# Patient Record
Sex: Male | Born: 1935
Health system: Southern US, Community
[De-identification: ages and names within clinical notes are randomized; demographics above are authoritative.]

## PROBLEM LIST (undated history)

## (undated) DIAGNOSIS — I10 Essential (primary) hypertension: Secondary | ICD-10-CM

## (undated) DIAGNOSIS — I2510 Atherosclerotic heart disease of native coronary artery without angina pectoris: Secondary | ICD-10-CM

## (undated) DIAGNOSIS — Z9889 Other specified postprocedural states: Secondary | ICD-10-CM

## (undated) DIAGNOSIS — E119 Type 2 diabetes mellitus without complications: Secondary | ICD-10-CM

## (undated) DIAGNOSIS — M539 Dorsopathy, unspecified: Secondary | ICD-10-CM

## (undated) DIAGNOSIS — H6690 Otitis media, unspecified, unspecified ear: Secondary | ICD-10-CM

## (undated) DIAGNOSIS — R7309 Other abnormal glucose: Secondary | ICD-10-CM

## (undated) DIAGNOSIS — C61 Malignant neoplasm of prostate: Secondary | ICD-10-CM

## (undated) DIAGNOSIS — E785 Hyperlipidemia, unspecified: Secondary | ICD-10-CM

## (undated) DIAGNOSIS — N529 Male erectile dysfunction, unspecified: Secondary | ICD-10-CM

## (undated) DIAGNOSIS — M489 Spondylopathy, unspecified: Secondary | ICD-10-CM

## (undated) HISTORY — DX: Dorsopathy, unspecified: M53.9

## (undated) HISTORY — DX: Otitis media, unspecified, unspecified ear: H66.90

## (undated) HISTORY — DX: Spondylopathy, unspecified: M48.9

## (undated) HISTORY — DX: Other specified postprocedural states: Z98.890

## (undated) HISTORY — DX: Type 2 diabetes mellitus without complications: E11.9

## (undated) HISTORY — DX: Malignant neoplasm of prostate: C61

## (undated) HISTORY — DX: Male erectile dysfunction, unspecified: N52.9

## (undated) HISTORY — PX: PROSTATECTOMY: SHX69

## (undated) HISTORY — PX: LAMINECTOMY: SHX219

## (undated) HISTORY — DX: Other abnormal glucose: R73.09

## (undated) HISTORY — DX: Hyperlipidemia, unspecified: E78.5

## (undated) HISTORY — PX: OTHER SURGICAL HISTORY: SHX169

## (undated) HISTORY — DX: Essential (primary) hypertension: I10

## (undated) HISTORY — DX: Atherosclerotic heart disease of native coronary artery without angina pectoris: I25.10

---

## 1990-01-27 HISTORY — PX: ANGIOPLASTY: SHX39

## 1996-09-27 ENCOUNTER — Encounter: Payer: Self-pay | Admitting: Family Medicine

## 1996-09-27 LAB — CONVERTED CEMR LAB: PSA: 2 ng/mL

## 1998-04-28 ENCOUNTER — Encounter: Payer: Self-pay | Admitting: Family Medicine

## 1998-04-28 LAB — CONVERTED CEMR LAB: PSA: 3.6 ng/mL

## 1999-08-28 ENCOUNTER — Encounter: Payer: Self-pay | Admitting: Family Medicine

## 1999-08-28 LAB — CONVERTED CEMR LAB: PSA: 2.8 ng/mL

## 2000-07-27 ENCOUNTER — Encounter: Payer: Self-pay | Admitting: Family Medicine

## 2000-07-27 LAB — CONVERTED CEMR LAB: PSA: 3.6 ng/mL

## 2001-01-27 ENCOUNTER — Encounter: Payer: Self-pay | Admitting: Family Medicine

## 2001-01-27 LAB — CONVERTED CEMR LAB: Hgb A1c MFr Bld: 5.4 %

## 2001-06-27 ENCOUNTER — Encounter: Payer: Self-pay | Admitting: Family Medicine

## 2001-06-27 LAB — CONVERTED CEMR LAB
Hgb A1c MFr Bld: 5.3 %
PSA: 3.1 ng/mL

## 2002-06-28 ENCOUNTER — Encounter: Payer: Self-pay | Admitting: Family Medicine

## 2002-06-28 LAB — CONVERTED CEMR LAB: PSA: 4 ng/mL

## 2002-10-28 ENCOUNTER — Encounter: Payer: Self-pay | Admitting: Family Medicine

## 2002-10-28 LAB — CONVERTED CEMR LAB: PSA: 4.1 ng/mL

## 2003-01-09 ENCOUNTER — Inpatient Hospital Stay (HOSPITAL_COMMUNITY): Admission: RE | Admit: 2003-01-09 | Discharge: 2003-01-12 | Payer: Self-pay | Admitting: Urology

## 2003-01-09 ENCOUNTER — Encounter (INDEPENDENT_AMBULATORY_CARE_PROVIDER_SITE_OTHER): Payer: Self-pay | Admitting: Specialist

## 2003-03-28 ENCOUNTER — Encounter: Payer: Self-pay | Admitting: Family Medicine

## 2003-03-28 LAB — CONVERTED CEMR LAB: PSA: 0 ng/mL

## 2003-06-01 ENCOUNTER — Inpatient Hospital Stay (HOSPITAL_COMMUNITY): Admission: EM | Admit: 2003-06-01 | Discharge: 2003-06-07 | Payer: Self-pay | Admitting: Emergency Medicine

## 2003-06-02 HISTORY — PX: CORONARY ANGIOPLASTY: SHX604

## 2003-07-28 ENCOUNTER — Encounter: Payer: Self-pay | Admitting: Family Medicine

## 2003-07-28 LAB — CONVERTED CEMR LAB
Hgb A1c MFr Bld: 5.3 %
Microalbumin U total vol: 1.3 mg/L

## 2003-12-28 ENCOUNTER — Ambulatory Visit: Payer: Self-pay

## 2004-01-05 ENCOUNTER — Ambulatory Visit: Payer: Self-pay | Admitting: Cardiology

## 2004-01-15 ENCOUNTER — Ambulatory Visit: Payer: Self-pay | Admitting: Cardiology

## 2004-02-12 ENCOUNTER — Ambulatory Visit: Payer: Self-pay | Admitting: Family Medicine

## 2004-04-29 ENCOUNTER — Ambulatory Visit: Payer: Self-pay | Admitting: Cardiology

## 2004-06-26 ENCOUNTER — Ambulatory Visit: Payer: Self-pay | Admitting: Internal Medicine

## 2004-08-20 ENCOUNTER — Ambulatory Visit: Payer: Self-pay | Admitting: Cardiology

## 2005-01-28 ENCOUNTER — Ambulatory Visit: Payer: Self-pay | Admitting: Family Medicine

## 2005-02-14 ENCOUNTER — Ambulatory Visit: Payer: Self-pay | Admitting: Family Medicine

## 2005-06-02 ENCOUNTER — Emergency Department (HOSPITAL_COMMUNITY): Admission: EM | Admit: 2005-06-02 | Discharge: 2005-06-02 | Payer: Self-pay | Admitting: Family Medicine

## 2005-09-25 ENCOUNTER — Ambulatory Visit: Payer: Self-pay | Admitting: Cardiology

## 2005-11-13 ENCOUNTER — Ambulatory Visit: Payer: Self-pay | Admitting: Family Medicine

## 2005-11-18 ENCOUNTER — Ambulatory Visit: Payer: Self-pay | Admitting: Family Medicine

## 2005-11-26 ENCOUNTER — Ambulatory Visit: Payer: Self-pay | Admitting: Gastroenterology

## 2005-12-23 ENCOUNTER — Ambulatory Visit: Payer: Self-pay | Admitting: Family Medicine

## 2005-12-30 ENCOUNTER — Encounter: Admission: RE | Admit: 2005-12-30 | Discharge: 2005-12-30 | Payer: Self-pay | Admitting: Orthopedic Surgery

## 2006-01-22 ENCOUNTER — Encounter: Admission: RE | Admit: 2006-01-22 | Discharge: 2006-01-22 | Payer: Self-pay | Admitting: Orthopedic Surgery

## 2006-01-28 ENCOUNTER — Encounter (INDEPENDENT_AMBULATORY_CARE_PROVIDER_SITE_OTHER): Payer: Self-pay | Admitting: Specialist

## 2006-01-28 ENCOUNTER — Ambulatory Visit: Payer: Self-pay | Admitting: Gastroenterology

## 2006-01-28 LAB — HM COLONOSCOPY

## 2006-05-20 ENCOUNTER — Ambulatory Visit: Payer: Self-pay | Admitting: Family Medicine

## 2006-10-02 ENCOUNTER — Ambulatory Visit: Payer: Self-pay | Admitting: Cardiology

## 2006-11-12 ENCOUNTER — Ambulatory Visit: Payer: Self-pay | Admitting: Family Medicine

## 2006-11-12 LAB — CONVERTED CEMR LAB
ALT: 35 units/L (ref 0–53)
AST: 25 units/L (ref 0–37)
BUN: 11 mg/dL (ref 6–23)
CO2: 29 meq/L (ref 19–32)
Calcium: 9.3 mg/dL (ref 8.4–10.5)
Chloride: 107 meq/L (ref 96–112)
Cholesterol: 193 mg/dL (ref 0–200)
Creatinine, Ser: 0.8 mg/dL (ref 0.4–1.5)
GFR calc Af Amer: 123 mL/min
GFR calc non Af Amer: 101 mL/min
Glucose, Bld: 117 mg/dL — ABNORMAL HIGH (ref 70–99)
HDL: 46.5 mg/dL (ref >39.0)
LDL Cholesterol: 109 mg/dL — ABNORMAL HIGH (ref 0–99)
PSA: <0.03 ng/mL — ABNORMAL LOW (ref 0.10–4.00)
Potassium: 4.6 meq/L (ref 3.5–5.1)
Sodium: 140 meq/L (ref 135–145)
TSH: 2.59 microintl units/mL (ref 0.35–5.50)
Total CHOL/HDL Ratio: 4.2
Triglycerides: 188 mg/dL — ABNORMAL HIGH (ref 0–149)
VLDL: 38 mg/dL (ref 0–40)

## 2006-11-20 ENCOUNTER — Encounter: Admission: RE | Admit: 2006-11-20 | Discharge: 2006-11-20 | Payer: Self-pay | Admitting: Orthopedic Surgery

## 2006-11-26 ENCOUNTER — Ambulatory Visit: Payer: Self-pay | Admitting: Family Medicine

## 2006-11-26 DIAGNOSIS — N529 Male erectile dysfunction, unspecified: Secondary | ICD-10-CM | POA: Insufficient documentation

## 2006-11-26 DIAGNOSIS — E782 Mixed hyperlipidemia: Secondary | ICD-10-CM

## 2006-11-26 DIAGNOSIS — I10 Essential (primary) hypertension: Secondary | ICD-10-CM

## 2006-11-26 DIAGNOSIS — C61 Malignant neoplasm of prostate: Secondary | ICD-10-CM

## 2006-11-26 DIAGNOSIS — E749 Disorder of carbohydrate metabolism, unspecified: Secondary | ICD-10-CM | POA: Insufficient documentation

## 2006-11-26 DIAGNOSIS — M7530 Calcific tendinitis of unspecified shoulder: Secondary | ICD-10-CM | POA: Insufficient documentation

## 2006-12-04 ENCOUNTER — Encounter: Admission: RE | Admit: 2006-12-04 | Discharge: 2006-12-04 | Payer: Self-pay | Admitting: Orthopedic Surgery

## 2006-12-17 ENCOUNTER — Encounter: Admission: RE | Admit: 2006-12-17 | Discharge: 2006-12-17 | Payer: Self-pay | Admitting: Orthopedic Surgery

## 2007-01-05 ENCOUNTER — Ambulatory Visit: Payer: Self-pay | Admitting: Family Medicine

## 2007-01-27 ENCOUNTER — Ambulatory Visit: Payer: Self-pay | Admitting: Family Medicine

## 2007-02-10 ENCOUNTER — Encounter: Payer: Self-pay | Admitting: Family Medicine

## 2007-03-16 ENCOUNTER — Encounter: Payer: Self-pay | Admitting: Family Medicine

## 2007-03-17 ENCOUNTER — Encounter: Admission: RE | Admit: 2007-03-17 | Discharge: 2007-03-17 | Payer: Self-pay | Admitting: Orthopedic Surgery

## 2007-03-18 ENCOUNTER — Encounter: Payer: Self-pay | Admitting: Family Medicine

## 2007-03-18 DIAGNOSIS — M5137 Other intervertebral disc degeneration, lumbosacral region: Secondary | ICD-10-CM

## 2007-03-18 DIAGNOSIS — M51379 Other intervertebral disc degeneration, lumbosacral region without mention of lumbar back pain or lower extremity pain: Secondary | ICD-10-CM | POA: Insufficient documentation

## 2007-03-24 ENCOUNTER — Ambulatory Visit: Payer: Self-pay | Admitting: Internal Medicine

## 2007-03-31 ENCOUNTER — Ambulatory Visit: Payer: Self-pay

## 2007-03-31 ENCOUNTER — Encounter: Payer: Self-pay | Admitting: Family Medicine

## 2007-04-09 ENCOUNTER — Ambulatory Visit: Payer: Self-pay | Admitting: Internal Medicine

## 2007-04-13 ENCOUNTER — Inpatient Hospital Stay (HOSPITAL_COMMUNITY): Admission: RE | Admit: 2007-04-13 | Discharge: 2007-04-15 | Payer: Self-pay | Admitting: Neurosurgery

## 2007-05-20 ENCOUNTER — Ambulatory Visit: Payer: Self-pay | Admitting: Family Medicine

## 2007-05-20 LAB — CONVERTED CEMR LAB: Glucose, Bld: 124 mg/dL — ABNORMAL HIGH (ref 70–99)

## 2007-05-25 ENCOUNTER — Ambulatory Visit: Payer: Self-pay | Admitting: Family Medicine

## 2007-09-23 ENCOUNTER — Ambulatory Visit: Payer: Self-pay | Admitting: Cardiology

## 2007-10-27 ENCOUNTER — Encounter: Payer: Self-pay | Admitting: Family Medicine

## 2007-10-28 ENCOUNTER — Ambulatory Visit: Payer: Self-pay | Admitting: Family Medicine

## 2007-10-28 LAB — CONVERTED CEMR LAB
ALT: 31 units/L (ref 0–53)
AST: 25 units/L (ref 0–37)
Albumin: 4.1 g/dL (ref 3.5–5.2)
Alkaline Phosphatase: 66 units/L (ref 39–117)
BUN: 18 mg/dL (ref 6–23)
Basophils Absolute: 0 10*3/uL (ref 0.0–0.1)
Basophils Relative: 0.2 % (ref 0.0–3.0)
Bilirubin, Direct: 0.1 mg/dL (ref 0.0–0.3)
CO2: 29 meq/L (ref 19–32)
Calcium: 9.4 mg/dL (ref 8.4–10.5)
Chloride: 104 meq/L (ref 96–112)
Cholesterol: 152 mg/dL (ref 0–200)
Creatinine, Ser: 0.9 mg/dL (ref 0.4–1.5)
Creatinine,U: 93.4 mg/dL
Eosinophils Absolute: 0.2 10*3/uL (ref 0.0–0.7)
Eosinophils Relative: 3.5 % (ref 0.0–5.0)
GFR calc Af Amer: 107 mL/min
GFR calc non Af Amer: 88 mL/min
Glucose, Bld: 103 mg/dL — ABNORMAL HIGH (ref 70–99)
HCT: 39.5 % (ref 39.0–52.0)
HDL: 36.7 mg/dL — ABNORMAL LOW (ref >39.0)
Hemoglobin: 13.5 g/dL (ref 13.0–17.0)
LDL Cholesterol: 89 mg/dL (ref 0–99)
Lymphocytes Relative: 29.4 % (ref 12.0–46.0)
MCHC: 34.3 g/dL (ref 30.0–36.0)
MCV: 91.6 fL (ref 78.0–100.0)
Microalb Creat Ratio: 2.1 mg/g (ref 0.0–30.0)
Microalb, Ur: 0.2 mg/dL (ref 0.0–1.9)
Monocytes Absolute: 0.6 10*3/uL (ref 0.1–1.0)
Monocytes Relative: 10.5 % (ref 3.0–12.0)
Neutro Abs: 3 10*3/uL (ref 1.4–7.7)
Neutrophils Relative %: 56.4 % (ref 43.0–77.0)
PSA: 0.01 ng/mL — ABNORMAL LOW (ref 0.10–4.00)
Platelets: 166 10*3/uL (ref 150–400)
Potassium: 5.3 meq/L — ABNORMAL HIGH (ref 3.5–5.1)
RBC: 4.31 M/uL (ref 4.22–5.81)
RDW: 12.7 % (ref 11.5–14.6)
Sodium: 141 meq/L (ref 135–145)
TSH: 2.43 microintl units/mL (ref 0.35–5.50)
Total Bilirubin: 1.1 mg/dL (ref 0.3–1.2)
Total CHOL/HDL Ratio: 4.1
Total Protein: 6.9 g/dL (ref 6.0–8.3)
Triglycerides: 134 mg/dL (ref 0–149)
VLDL: 27 mg/dL (ref 0–40)
WBC: 5.4 10*3/uL (ref 4.5–10.5)

## 2007-11-03 ENCOUNTER — Ambulatory Visit: Payer: Self-pay | Admitting: Family Medicine

## 2007-11-18 ENCOUNTER — Ambulatory Visit (HOSPITAL_BASED_OUTPATIENT_CLINIC_OR_DEPARTMENT_OTHER): Admission: RE | Admit: 2007-11-18 | Discharge: 2007-11-18 | Payer: Self-pay | Admitting: Orthopedic Surgery

## 2007-11-18 ENCOUNTER — Encounter: Payer: Self-pay | Admitting: Family Medicine

## 2007-11-18 HISTORY — PX: OTHER SURGICAL HISTORY: SHX169

## 2007-11-24 ENCOUNTER — Ambulatory Visit: Payer: Self-pay | Admitting: Family Medicine

## 2007-11-24 LAB — CONVERTED CEMR LAB
OCCULT 1: POSITIVE
OCCULT 2: POSITIVE
OCCULT 3: NEGATIVE

## 2007-12-17 ENCOUNTER — Ambulatory Visit: Payer: Self-pay | Admitting: Family Medicine

## 2007-12-17 LAB — CONVERTED CEMR LAB
OCCULT 1: NEGATIVE
OCCULT 2: NEGATIVE
OCCULT 3: NEGATIVE

## 2007-12-20 ENCOUNTER — Encounter (INDEPENDENT_AMBULATORY_CARE_PROVIDER_SITE_OTHER): Payer: Self-pay | Admitting: *Deleted

## 2008-05-02 ENCOUNTER — Ambulatory Visit: Payer: Self-pay | Admitting: Family Medicine

## 2008-09-01 ENCOUNTER — Ambulatory Visit: Payer: Self-pay | Admitting: Cardiology

## 2008-10-26 ENCOUNTER — Ambulatory Visit: Payer: Self-pay | Admitting: Family Medicine

## 2008-10-26 LAB — CONVERTED CEMR LAB
ALT: 29 units/L (ref 0–53)
AST: 23 units/L (ref 0–37)
Albumin: 4.3 g/dL (ref 3.5–5.2)
Alkaline Phosphatase: 63 units/L (ref 39–117)
BUN: 17 mg/dL (ref 6–23)
Basophils Absolute: 0 10*3/uL (ref 0.0–0.1)
Basophils Relative: 0.3 % (ref 0.0–3.0)
Bilirubin, Direct: 0 mg/dL (ref 0.0–0.3)
CO2: 24 meq/L (ref 19–32)
Calcium: 9 mg/dL (ref 8.4–10.5)
Chloride: 106 meq/L (ref 96–112)
Cholesterol: 141 mg/dL (ref 0–200)
Creatinine, Ser: 0.8 mg/dL (ref 0.4–1.5)
Creatinine,U: 75.2 mg/dL
Eosinophils Absolute: 0.2 10*3/uL (ref 0.0–0.7)
Eosinophils Relative: 3.6 % (ref 0.0–5.0)
GFR calc non Af Amer: 100.46 mL/min (ref >60)
Glucose, Bld: 112 mg/dL — ABNORMAL HIGH (ref 70–99)
HCT: 39.4 % (ref 39.0–52.0)
HDL: 35.3 mg/dL — ABNORMAL LOW (ref >39.00)
Hemoglobin: 13.6 g/dL (ref 13.0–17.0)
LDL Cholesterol: 86 mg/dL (ref 0–99)
Lymphocytes Relative: 28.9 % (ref 12.0–46.0)
Lymphs Abs: 1.3 10*3/uL (ref 0.7–4.0)
MCHC: 34.5 g/dL (ref 30.0–36.0)
MCV: 92 fL (ref 78.0–100.0)
Microalb Creat Ratio: 1.3 mg/g (ref 0.0–30.0)
Microalb, Ur: 0.1 mg/dL (ref 0.0–1.9)
Monocytes Absolute: 0.4 10*3/uL (ref 0.1–1.0)
Monocytes Relative: 9.8 % (ref 3.0–12.0)
Neutro Abs: 2.6 10*3/uL (ref 1.4–7.7)
Neutrophils Relative %: 57.4 % (ref 43.0–77.0)
PSA: 0.01 ng/mL — ABNORMAL LOW (ref 0.10–4.00)
Platelets: 132 10*3/uL — ABNORMAL LOW (ref 150.0–400.0)
Potassium: 4 meq/L (ref 3.5–5.1)
RBC: 4.28 M/uL (ref 4.22–5.81)
RDW: 12.3 % (ref 11.5–14.6)
Sodium: 137 meq/L (ref 135–145)
TSH: 2.73 microintl units/mL (ref 0.35–5.50)
Total Bilirubin: 0.9 mg/dL (ref 0.3–1.2)
Total CHOL/HDL Ratio: 4
Total Protein: 6.7 g/dL (ref 6.0–8.3)
Triglycerides: 99 mg/dL (ref 0.0–149.0)
VLDL: 19.8 mg/dL (ref 0.0–40.0)
WBC: 4.5 10*3/uL (ref 4.5–10.5)

## 2008-10-29 LAB — CONVERTED CEMR LAB: Vit D, 25-Hydroxy: 35 ng/mL (ref 30–89)

## 2008-11-02 ENCOUNTER — Ambulatory Visit: Payer: Self-pay | Admitting: Family Medicine

## 2008-11-15 ENCOUNTER — Ambulatory Visit: Payer: Self-pay | Admitting: Family Medicine

## 2008-11-15 LAB — CONVERTED CEMR LAB
OCCULT 1: NEGATIVE
OCCULT 2: NEGATIVE
OCCULT 3: NEGATIVE

## 2009-05-07 ENCOUNTER — Ambulatory Visit: Payer: Self-pay | Admitting: Family Medicine

## 2009-08-28 ENCOUNTER — Ambulatory Visit: Payer: Self-pay | Admitting: Cardiology

## 2009-08-29 ENCOUNTER — Encounter (INDEPENDENT_AMBULATORY_CARE_PROVIDER_SITE_OTHER): Payer: Self-pay | Admitting: *Deleted

## 2009-11-09 ENCOUNTER — Ambulatory Visit: Payer: Self-pay | Admitting: Family Medicine

## 2009-11-09 ENCOUNTER — Telehealth (INDEPENDENT_AMBULATORY_CARE_PROVIDER_SITE_OTHER): Payer: Self-pay | Admitting: *Deleted

## 2009-11-12 LAB — CONVERTED CEMR LAB
ALT: 26 units/L (ref 0–53)
AST: 18 units/L (ref 0–37)
Albumin: 4.1 g/dL (ref 3.5–5.2)
Alkaline Phosphatase: 57 units/L (ref 39–117)
BUN: 12 mg/dL (ref 6–23)
Basophils Absolute: 0.1 10*3/uL (ref 0.0–0.1)
Basophils Relative: 0.7 % (ref 0.0–3.0)
Bilirubin, Direct: 0.1 mg/dL (ref 0.0–0.3)
CO2: 27 meq/L (ref 19–32)
Calcium: 9.3 mg/dL (ref 8.4–10.5)
Chloride: 106 meq/L (ref 96–112)
Cholesterol: 131 mg/dL (ref 0–200)
Creatinine, Ser: 0.9 mg/dL (ref 0.4–1.5)
Eosinophils Absolute: 0.4 10*3/uL (ref 0.0–0.7)
Eosinophils Relative: 5.5 % — ABNORMAL HIGH (ref 0.0–5.0)
GFR calc non Af Amer: 84.2 mL/min (ref >60)
Glucose, Bld: 115 mg/dL — ABNORMAL HIGH (ref 70–99)
HCT: 38.8 % — ABNORMAL LOW (ref 39.0–52.0)
HDL: 36.6 mg/dL — ABNORMAL LOW (ref >39.00)
Hemoglobin: 13.5 g/dL (ref 13.0–17.0)
LDL Cholesterol: 65 mg/dL (ref 0–99)
Lymphocytes Relative: 39.8 % (ref 12.0–46.0)
Lymphs Abs: 3 10*3/uL (ref 0.7–4.0)
MCHC: 34.8 g/dL (ref 30.0–36.0)
MCV: 91 fL (ref 78.0–100.0)
Monocytes Absolute: 0.6 10*3/uL (ref 0.1–1.0)
Monocytes Relative: 8.3 % (ref 3.0–12.0)
Neutro Abs: 3.5 10*3/uL (ref 1.4–7.7)
Neutrophils Relative %: 45.7 % (ref 43.0–77.0)
PSA: 0.01 ng/mL — ABNORMAL LOW (ref 0.10–4.00)
Platelets: 161 10*3/uL (ref 150.0–400.0)
Potassium: 4.7 meq/L (ref 3.5–5.1)
RBC: 4.26 M/uL (ref 4.22–5.81)
RDW: 12.6 % (ref 11.5–14.6)
Sodium: 139 meq/L (ref 135–145)
TSH: 2.61 microintl units/mL (ref 0.35–5.50)
Total Bilirubin: 0.9 mg/dL (ref 0.3–1.2)
Total CHOL/HDL Ratio: 4
Total Protein: 6.2 g/dL (ref 6.0–8.3)
Triglycerides: 147 mg/dL (ref 0.0–149.0)
VLDL: 29.4 mg/dL (ref 0.0–40.0)
WBC: 7.6 10*3/uL (ref 4.5–10.5)

## 2009-11-21 ENCOUNTER — Ambulatory Visit: Payer: Self-pay | Admitting: Family Medicine

## 2010-02-28 NOTE — Letter (Signed)
Summary: Nadara Eaton letter  [place] [facility]  [address] [place], [place] [address]   Phone: [phone]  Fax: [phone]       08/29/2009 MRN: [id number]  [facility] [address] [place], [place]  [address]  Dear Mr. [place],  [facility] [place], and [place] announce the retirement of Arta Silence, M.D., from full-time practice at the [facility] office effective July 26, 2009 and his plans of returning part-time.  It is important to Dr. Hetty Ely and to our practice that you understand that [facility] has seven physicians in our office for your health care needs.  We will continue to offer the same exceptional care that you have today.    Dr. Hetty Ely has spoken to many of you about his plans for retirement and returning part-time in the fall.   We will continue to work with you through the transition to schedule appointments for you in the office and meet the high standards that [facility] is committed to.   Again, it is with great pleasure that we share the news that Dr. Hetty Ely will return to [facility] at [facility] in October of 2011 with a reduced schedule.    If you have any questions, or would like to request an appointment with one of our physicians, please call us at [phone] and press the option for Scheduling an appointment.  We take pleasure in providing you with excellent patient care and look forward to seeing you at your next office visit.  Our [facility] Physicians are:  Tillman Abide, M.D. Laurita Quint, M.D. Roxy Manns, M.D. Kerby Nora, M.D. Hannah Beat, M.D. Ruthe Mannan, M.D. We proudly welcomed Raechel Ache, M.D. and Eustaquio Boyden, M.D. to the practice in July/August 2011.  Sincerely,  [facility]

## 2010-02-28 NOTE — Assessment & Plan Note (Signed)
Summary: CPX/CLE   Vital Signs:  Patient profile:   77 year old male Weight:      208.25 pounds Temp:     98.4 degrees F oral Pulse rate:   64 / minute Pulse rhythm:   regular BP sitting:   118 / 70  (left arm) Cuff size:   large  Vitals Entered By: Sydell Axon LPN (November 21, 2009 2:05 PM) CC: 30 minute checkup  Vision Screening:Left eye with correction: 20 / 20 Right eye with correction: 20 / 20 Both eyes with correction: 20 / 20       25db HL: Left  500 hz: 25db 1000 hz: 25db 2000 hz: 25db 4000 hz: No Response Right  500 hz: 25db 1000 hz: 25db 2000 hz: 25db 4000 hz: No Response    History of Present Illness: Pt here for Medicare PE. He is doing well with no complaints. Abpout a month ago he had episodes of nearfainting with getting up. He has not noticed it the last few weeks. It would happen with prolonged sitting and then getting up real quick. It is not spinning.   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 Take One By Mouth Once A Day 2)  Lopressor 50 Mg  Tabs (Metoprolol Tartrate) .Marland Kitchen.. 1 Twice A Day By Mouth 3)  Vytorin 10-40 Mg  Tabs  (Ezetimibe-Simvastatin) .Marland Kitchen.. 1at Bedtime By Mouth 4)  Altace 2.5 Mg  Tabs (Ramipril) .Marland Kitchen.. 1 Twice A Day By Mouth 5)  Bayer Childrens Aspirin 81 Mg  Chew (Aspirin) .Marland Kitchen.. 1 Daily By Mouth 6)  Fish Oil   Oil (Fish Oil) .... Daily By Mouth 7)  Aleve 220 Mg Tabs (Naproxen Sodium) .Marland Kitchen.. 1 Twice A Day By Mouth 8)  Calcium 600 600 Mg Tabs (Calcium Carbonate) .... Take One Tablet By Mouth Once Daily. 9)  Nitrostat 0.4 Mg Subl (Nitroglycerin) .Marland Kitchen.. 1 Tablet Under Tongue At Onset of Chest Pain; You May Repeat Every 5 Minutes For Up To 3 Doses.  Allergies: 1)  ! Tetracycline  Past History:  Past Medical History: Last updated: 08/31/2008  Impotence of Organic nature Elevated glucose Prostate Cancer, S/P Prostectomy 1. Coronary artery disease, status post percutaneous coronary     intervention 11 years ago, and status post overlapping DES in the     left anterior descending artery in May 2005 2. Good left ventricular function. 3. Hypertension. 4. Hyperlipidemia. 5. Lumbosacral spine disease.   Past Surgical History: Last updated: 11/23/2007 PILONIDAL CYSTECTOMY LATE 20s ANGIOPLASTY 1992 HOSP  SUBDURAL HEMATOMA-FELL OFF TRACTOR BONE SPUR R FOOT 04/1999 STRESS CARDIOLITE HTNSIVE REPONSE  NO CHANGES 08/08/2002 PROSTATECTOMY 12/2002 HOSP  CP  MI CAD INCR CHOL HTN 5/5-5/11/2003 CATH PTCA, OCCL OF VESSEL W/ GOOD COLLATERALS 06/02/2003 R 4TH TOE BONE EXCISION 05/2003 STRESS MYOVIEW NML EF 64% 12/28/2003 COLONOSCOPY HYPERPL POLYP 01/28/2006        5 YRS LAMINECTOMY L4/5 CALCIUM DEPOSIT (DR ROY) 04/10/2007 ADENOSINE MYOVIEW NML 03/31/2007 LEFT SHOULDER DECOMPRESSION (DR DUDA) 05/11/2007 RCTR (Carpal Tunnel Repair) (Dr Sypher) 11/18/2007  Family History: Last updated: 11/26/2006 Father dec 83 Prostate Ca Mother dec 93 Colon Ca  Leukemia Only child  Social History: Last updated: 08/31/2008 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   Risk Factors: Alcohol Use: <1 (11/21/2009) Caffeine Use: 2 (11/21/2009) Exercise: yes (11/21/2009)  Risk Factors: Smoking Status: never (11/21/2009) Passive Smoke Exposure: no (11/21/2009)  Review of Systems General:  Denies chills, fatigue, fever, sweats, weakness, and weight loss. Eyes:  Denies blurring, discharge, eye irritation, and itching. ENT:  Denies decreased hearing, earache, and ringing in ears. CV:  Complains of lightheadness; denies chest pain or discomfort, fainting, fatigue, palpitations, shortness of breath with exertion, swelling of feet, and swelling of hands. Resp:  Denies cough, shortness of breath, and wheezing. GI:  Denies abdominal pain, bloody stools, change in bowel habits, constipation, dark tarry stools, diarrhea, indigestion, loss of appetite, nausea, vomiting, vomiting blood, and yellowish skin color. GU:  Denies discharge, dysuria, nocturia, and urinary frequency. MS:  Denies joint pain, low back pain, muscle aches, cramps, and stiffness. Derm:  Denies dryness, itching, and rash. Neuro:  Denies numbness, poor balance, tingling, and tremors.  Physical Exam  General:  Well-developed,well-nourished,in no acute distress; alert,appropriate and cooperative throughout examination Head:  Normocephalic and atraumatic without obvious abnormalities. No apparent alopecia or balding. Sinuses nontender. Eyes:  Conjunctiva clear bilaterally.  Ears:  External ear exam shows no significant lesions or deformities.  Otoscopic examination reveals clear canals, tympanic membranes are intact bilaterally without bulging, retraction, inflammation or discharge. Hearing is grossly normal bilaterally. Nose:  External nasal examination shows no deformity or inflammation. Nasal mucosa are pink and moist without lesions or exudates. Mouth:  Oral  mucosa and oropharynx without lesions or exudates.  Teeth in good repair. Neck:  supple and full ROM.   Chest Wall:  No deformities, masses, tenderness or gynecomastia noted. Lungs:  normal respiratory effort and no accessory muscle use.  Mildly decreased bases and there are expiratory whines. No wheezing or rales. Heart:  normal rate and regular rhythm.   Abdomen:  Bowel sounds positive,abdomen soft and non-tender without masses, organomegaly or hernias noted. Rectal:  No external abnormalities noted. Normal sphincter tone. No rectal masses or tenderness. Genitalia:  Testes bilaterally descended without nodularity, tenderness or masses. No scrotal masses or lesions. No penis lesions or urethral discharge. Prostate:  Appears surgically absent. Msk:  No deformity or scoliosis noted of thoracic or lumbar spine.   Pulses:  R and L carotid,radial,femoral,dorsalis pedis and posterior tibial pulses are full and equal bilaterally Extremities:  No clubbing, cyanosis, edema, or deformity noted with normal full range of motion of all joints.  L gt toe MTP joint appears mildly chronically swollen, no erythema, warmth. Neurologic:  No cranial nerve deficits noted. Station and gait are normal. Sensory, motor and coordinative functions appear intact. Skin:  Early 6mm concretion forming in chronic sebaceous cyst  or mid upper back.  Cervical Nodes:  No lymphadenopathy noted Inguinal Nodes:  No significant adenopathy Psych:  Cognition and judgment appear intact. Alert and cooperative with normal attention span and concentration. No apparent delusions, illusions, hallucinations   Impression & Recommendations:  Problem # 1:  HEALTH SCREENING (ICD-V70.0) Assessment Comment Only  Shots UTD. Flu today. I have personally reviewed the Medicare Annual Wellness questionnaire and have noted 1.   The patient's medical and social history 2.   Their use of alcohol, tobacco or illicit drugs 3.   Their current  medications and supplements 4.   The patient's functional ability including ADL's, fall risks, home safety risks and hearing or visual             impairment. 5.   Diet and physical activities 6.   Evidence for depression or mood disorders  Orders: Medicare -1st Annual Wellness Visit [phone])  Problem # 2:  DEGENERATIVE DISC DISEASE, LUMBAR SPINE (ICD-722.52) Assessment: Improved Better with current therapy. Suggest adding Hyaluronic Acid to regimen.  Problem # 3:  DISORDER, CARBOHYDRATE METABOLISM NOS (ICD-271.9) Assessment: Unchanged Discussed, stabler. Diet could be improved slightly. Kidney function good.  Problem # 4:  NEOP, MALIGNANT, PROSTATE (ICD-185) Assessment: Unchanged S/P prostatectomy. Stable.  Problem # 5:  HYPERLIPIDEMIA, MIXED (ICD-272.2) Assessment: Unchanged Stable, cont curr meds. His updated medication list for this problem includes:    Vytorin 10-40 Mg Tabs (Ezetimibe-simvastatin) .Marland Kitchen... 1at bedtime by mouth  Labs Reviewed: SGOT: 18 (11/09/2009)   SGPT: 26 (11/09/2009)   HDL:36.60 (11/09/2009), 35.30 (10/26/2008)  LDL:65 (11/09/2009), 86 (10/26/2008)  Chol:131 (11/09/2009), 141 (10/26/2008)  Trig:147.0 (11/09/2009), 99.0 (10/26/2008)  Problem # 6:  HYPERTENSION, BENIGN ESSENTIAL (ICD-401.1) Assessment: Unchanged Stable. His updated medication list for this problem includes:    Lopressor 50 Mg Tabs (Metoprolol tartrate) .Marland Kitchen... 1 twice a day by mouth    Altace 2.5 Mg Tabs (Ramipril) .Marland Kitchen... 1 twice a day by mouth  BP today: 118/70 Prior BP: 125/66 (08/28/2009)  Labs Reviewed: K+: 4.7 (11/09/2009) Creat: : 0.9 (11/09/2009)   Chol: 131 (11/09/2009)   HDL: 36.60 (11/09/2009)   LDL: 65 (11/09/2009)   TG: 147.0 (11/09/2009)  Complete Medication List: 1)  Plavix 75 Mg Tabs (Clopidogrel bisulfate) .... Take one by mouth once a day 2)  Lopressor 50 Mg Tabs (Metoprolol tartrate) .Marland Kitchen.. 1 twice a day by mouth 3)  Vytorin 10-40 Mg Tabs (Ezetimibe-simvastatin) .Marland Kitchen..  1at bedtime by mouth 4)  Altace 2.5 Mg Tabs (Ramipril) .Marland Kitchen.. 1 twice a day by mouth 5)  Bayer Childrens Aspirin 81 Mg Chew (Aspirin) .Marland Kitchen.. 1 daily by mouth 6)  Fish Oil Oil (Fish oil) .... Daily by mouth 7)  Aleve 220 Mg Tabs (Naproxen sodium) .Marland Kitchen.. 1 twice a day by mouth 8)  Calcium 600 600 Mg Tabs (Calcium carbonate) .... Take one tablet by mouth once daily. 9)  Nitrostat 0.4 Mg Subl (Nitroglycerin) .Marland Kitchen.. 1 tablet under tongue at onset of chest pain; you may repeat every 5 minutes for up to 3 doses.  Other Orders: Audiometry [id number]) Vision Screening (09811) Flu Vaccine 51yrs + MEDICARE PATIENTS [phone]) Administration Flu vaccine - MCR (G9562)  Patient Instructions: 1)  RTC one year, sooner as needed.   Orders Added: 1)  Audiometry [92552] 2)  Vision Screening [99173] 3)  Flu Vaccine 58yrs + MEDICARE PATIENTS [Q2039] 4)  Administration Flu vaccine - MCR [G0008] 5)  Medicare -1st Annual Wellness Visit [G0438]    Flu Vaccine Consent Questions     Do you  have a history of severe allergic reactions to this vaccine? no    Any prior history of allergic reactions to egg and/or gelatin? no    Do you have a sensitivity to the preservative Thimersol? no    Do you have a past history of Guillan-Barre Syndrome? no    Do you currently have an acute febrile illness? no    Have you ever had a severe reaction to latex? no    Vaccine information given and explained to patient? yes    Are you currently pregnant? no    Lot Number:[id number]   Exp Date:07/27/2010   Site Given  Right Deltoid IMmedflu Lugene Fuquay CMA Duncan Dull)  November 21, 2009 2:41 PM

## 2010-02-28 NOTE — Assessment & Plan Note (Signed)
Summary: PER CHECK OUT   Visit Type:  Follow-up Primary Provider:  Hetty Ely  CC:  no cardiac concerns.  History of Present Illness: The patient is 77 years old and return for management of CAD. In 1998 he had PTCA of the LAD. In 2005 he had a drug-eluting stent to the LAD and then a short time later had a second drug-eluting stent to the LAD overlapping the first for what I believe was an edge tear. He has done quite well since that time has had no recent chest pain shortness of breath or palpitations.  His other problems include hypertension hyperlipidemia.  Current Medications (verified): 1)  Plavix 75 Mg  Tabs (Clopidogrel Bisulfate) .... Take One By Mouth Once A Day 2)  Lopressor 50 Mg  Tabs (Metoprolol Tartrate) .Marland Kitchen.. 1 Twice A Day By Mouth 3)  Vytorin 10-40 Mg  Tabs (Ezetimibe-Simvastatin) .Marland Kitchen.. 1at Bedtime By Mouth 4)  Altace 2.5 Mg  Tabs (Ramipril) .Marland Kitchen.. 1 Twice A Day By Mouth 5)  Bayer Childrens Aspirin 81 Mg  Chew (Aspirin) .Marland Kitchen.. 1 Daily By Mouth 6)  Fish Oil   Oil (Fish Oil) .... Daily By Mouth 7)  Aleve 220 Mg Tabs (Naproxen Sodium) .Marland Kitchen.. 1 Twice A Day By Mouth 8)  Calcium 600 600 Mg Tabs (Calcium Carbonate) .... Take One Tablet By Mouth Once Daily. 9)  Nitrostat 0.4 Mg Subl (Nitroglycerin) .Marland Kitchen.. 1 Tablet Under Tongue At Onset of Chest Pain; You May Repeat Every 5 Minutes For Up To 3 Doses.  Allergies (verified): 1)  ! Tetracycline  Past History:  Past Medical History: Reviewed history from 08/31/2008 and no changes required.  Impotence of Organic nature Elevated glucose Prostate Cancer, S/P Prostectomy 1. Coronary artery disease, status post percutaneous coronary     intervention 11 years ago, and status post overlapping DES in the     left anterior descending artery in May 2005 2. Good left ventricular function. 3. Hypertension. 4. Hyperlipidemia. 5. Lumbosacral spine disease.   Review of Systems       ROS is negative except as outlined in HPI.   Vital  Signs:  Patient profile:   77 year old male Height:      70 inches Weight:      205 pounds BMI:     29.52 Pulse rate:   73 / minute BP sitting:   125 / 66  (left arm) Cuff size:   large  Vitals Entered By: Burnett Kanaris, CNA (August 28, 2009 4:53 PM)  Physical Exam  Additional Exam:  Gen. Well-nourished, in no distress   Neck: No JVD, thyroid not enlarged, no carotid bruits Lungs: No tachypnea, clear without rales, rhonchi or wheezes Cardiovascular: Rhythm regular, PMI not displaced,  heart sounds  normal, no murmurs or gallops, no peripheral edema, pulses normal in all 4 extremities. Abdomen: BS normal, abdomen soft and non-tender without masses or organomegaly, no hepatosplenomegaly. MS: No deformities, no cyanosis or clubbing   Neuro:  No focal sns   Skin:  no lesions    Impression & Recommendations:  Problem # 1:  CAD S/P PTCA (ICD-414.00) He has had remote PCI procedures as described in the history of present illness. He's had no chest pain this problem appears stable. His updated medication list for this problem includes:    Plavix 75 Mg Tabs (Clopidogrel bisulfate) .Marland Kitchen... Take one by mouth once a day    Lopressor 50 Mg Tabs (Metoprolol tartrate) .Marland Kitchen... 1 twice a day by mouth  Altace 2.5 Mg Tabs (Ramipril) .Marland Kitchen... 1 twice a day by mouth    Bayer Childrens Aspirin 81 Mg Chew (Aspirin) .Marland Kitchen... 1 daily by mouth    Nitrostat 0.4 Mg Subl (Nitroglycerin) .Marland Kitchen... 1 tablet under tongue at onset of chest pain; you may repeat every 5 minutes for up to 3 doses.  Problem # 2:  HYPERTENSION, BENIGN ESSENTIAL (ICD-401.1) His blood pressure appears well controlled on current medications. His updated medication list for this problem includes:    Lopressor 50 Mg Tabs (Metoprolol tartrate) .Marland Kitchen... 1 twice a day by mouth    Altace 2.5 Mg Tabs (Ramipril) .Marland Kitchen... 1 twice a day by mouth    Bayer Childrens Aspirin 81 Mg Chew (Aspirin) .Marland Kitchen... 1 daily by mouth  Problem # 3:  HYPERLIPIDEMIA, MIXED  (ICD-272.2) He is having his lipid profile check with his primary care physician. He said it was negative last November. His updated medication list for this problem includes:    Vytorin 10-40 Mg Tabs (Ezetimibe-simvastatin) .Marland Kitchen... 1at bedtime by mouth  Patient Instructions: 1)  Your physician wants you to follow-up in: 1 year with Dr. Clifton James.  You will receive a reminder letter in the mail two months in advance. If you don't receive a letter, please call our office to schedule the follow-up appointment. 2)  Your physician recommends that you continue on your current medications as directed. Please refer to the Current Medication list given to you today. Prescriptions: NITROSTAT 0.4 MG SUBL (NITROGLYCERIN) 1 tablet under tongue at onset of chest pain; you may repeat every 5 minutes for up to 3 doses.  #25 x 3   Entered by:   Sherri Rad, RN, BSN   Authorized by:   Lenoria Farrier, MD, [facility]   Signed by:   Sherri Rad, RN, BSN on 08/28/2009   Method used:   Electronically to        [facility]* (retail)       [address]       [place], [place]  [address]       Ph: [phone]       Fax: [phone]   RxID:   [id number] LOPRESSOR 50 MG  TABS (METOPROLOL TARTRATE) 1 twice a day by mouth  #180 x 3   Entered by:   Sherri Rad, RN, BSN   Authorized by:   Lenoria Farrier, MD, [facility]   Signed by:   Sherri Rad, RN, BSN on 08/28/2009   Method used:   Electronically to        [facility]* (retail)             ,          Ph: [phone]       Fax: [phone]   RxID:   [id number]

## 2010-02-28 NOTE — Assessment & Plan Note (Signed)
Summary: F/U FOR RECHECK / LFW   Vital Signs:  Patient profile:   77 year old male Weight:      215.25 pounds Temp:     98.3 degrees F oral Pulse rate:   60 / minute Pulse rhythm:   regular BP sitting:   112 / 60  (left arm) Cuff size:   large  Vitals Entered By: Sydell Axon LPN (May 07, 2009 8:08 AM) CC: 6 Month follow-up   History of Present Illness: Pt here for 6 month followup. He continues with arthritis, esp when cleaning after stool.  Sebaceous Cyst has not recurred. He has a longtime lesion on the lower neck. He has no further complaints today.   Problems Prior to Update: 1)  Sebaceous Cyst  (ICD-706.2) 2)  Special Screening Malig Neoplasms Other Sites  (ICD-V76.49) 3)  Degenerative Disc Disease, Lumbar Spine  (ICD-722.52) 4)  Tendinitis, Calcific, Shoulder, Left  (ICD-726.11) 5)  Erectile Dysfunction, Organic  (ICD-607.84) 6)  Cad S/p Ptca  (ICD-414.00) 7)  Disorder, Carbohydrate Metabolism Nos  (ICD-271.9) 8)  Neop, Malignant, Prostate  (ICD-185) 9)  Hyperlipidemia, Mixed  (ICD-272.2) 10)  Hypertension, Benign Essential  (ICD-401.1)  Medications Prior to Update: 1)  Plavix 75 Mg  Tabs (Clopidogrel Bisulfate) .... Take One By Mouth Once A Day 2)  Lopressor 50 Mg  Tabs (Metoprolol Tartrate) .Marland Kitchen.. 1 Twice A Day By Mouth 3)  Vytorin 10-40 Mg  Tabs (Ezetimibe-Simvastatin) .Marland Kitchen.. 1at Bedtime By Mouth 4)  Altace 2.5 Mg  Tabs (Ramipril) .Marland Kitchen.. 1 Twice A Day By Mouth 5)  Bayer Childrens Aspirin 81 Mg  Chew (Aspirin) .Marland Kitchen.. 1 Daily By Mouth 6)  Fish Oil   Oil (Fish Oil) .... Daily By Mouth 7)  Aleve 220 Mg Tabs (Naproxen Sodium) .Marland Kitchen.. 1 Twice A Day By Mouth 8)  Calcium 600 600 Mg Tabs (Calcium Carbonate) .... Take One Tablet By Mouth Once Daily. 9)  Nitrostat 0.4 Mg Subl (Nitroglycerin) .Marland Kitchen.. 1 Tablet Under Tongue At Onset of Chest Pain; You May Repeat Every 5 Minutes For Up To 3 Doses.  Allergies: 1)  ! Tetracycline  Physical Exam  General:   Well-developed,well-nourished,in no acute distress; alert,appropriate and cooperative throughout examination Head:  Normocephalic and atraumatic without obvious abnormalities. No apparent alopecia or balding. Sinuses nontender. Eyes:  Conjunctiva clear bilaterally.  Ears:  External ear exam shows no significant lesions or deformities.  Otoscopic examination reveals clear canals, tympanic membranes are intact bilaterally without bulging, retraction, inflammation or discharge. Hearing is grossly normal bilaterally. Nose:  External nasal examination shows no deformity or inflammation. Nasal mucosa are pink and moist without lesions or exudates. Mouth:  Oral mucosa and oropharynx without lesions or exudates.  Teeth in good repair. Neck:  supple and full ROM.   Lungs:  normal respiratory effort and no accessory muscle use.  Mildly decreased bases and there are expiratory whines. No wheezing or rales. Heart:  normal rate and regular rhythm.   Skin:  Early 6mm concretion forming in chronic sebaceous cyst or mid upper back.    Impression & Recommendations:  Problem # 1:  SEBACEOUS CYST (ICD-706.2) Assessment Improved Resolved.  Problem # 2:  HYPERTENSION, BENIGN ESSENTIAL (ICD-401.1) Assessment: Unchanged  His updated medication list for this problem includes:    Lopressor 50 Mg Tabs (Metoprolol tartrate) .Marland Kitchen... 1 twice a day by mouth    Altace 2.5 Mg Tabs (Ramipril) .Marland Kitchen... 1 twice a day by mouth  BP today: 112/60 Prior BP: 138/72 (11/02/2008)  Labs  Reviewed: K+: 4.0 (10/26/2008) Creat: : 0.8 (10/26/2008)   Chol: 141 (10/26/2008)   HDL: 35.30 (10/26/2008)   LDL: 86 (10/26/2008)   TG: 99.0 (10/26/2008)  Orders: Prescription Created Electronically [id number])  Problem # 3:  DISORDER, CARBOHYDRATE METABOLISM NOS (ICD-271.9) Assessment: Unchanged Reminded to avoid sweets and carbs.  Complete Medication List: 1)  Plavix 75 Mg Tabs (Clopidogrel bisulfate) .... Take one by mouth once a day 2)   Lopressor 50 Mg Tabs (Metoprolol tartrate) .Marland Kitchen.. 1 twice a day by mouth 3)  Vytorin 10-40 Mg Tabs (Ezetimibe-simvastatin) .Marland Kitchen.. 1at bedtime by mouth 4)  Altace 2.5 Mg Tabs (Ramipril) .Marland Kitchen.. 1 twice a day by mouth 5)  Bayer Childrens Aspirin 81 Mg Chew (Aspirin) .Marland Kitchen.. 1 daily by mouth 6)  Fish Oil Oil (Fish oil) .... Daily by mouth 7)  Aleve 220 Mg Tabs (Naproxen sodium) .Marland Kitchen.. 1 twice a day by mouth 8)  Calcium 600 600 Mg Tabs (Calcium carbonate) .... Take one tablet by mouth once daily. 9)  Nitrostat 0.4 Mg Subl (Nitroglycerin) .Marland Kitchen.. 1 tablet under tongue at onset of chest pain; you may repeat every 5 minutes for up to 3 doses.  Patient Instructions: 1)  Use cortaid on left elbow rash. 2)  RTC Nov/Dec for Comp Exam. Prescriptions: ALTACE 2.5 MG  TABS (RAMIPRIL) 1 twice a day by mouth  #180 x 4   Entered and Authorized by:   Shaune Leeks MD   Signed by:   Shaune Leeks MD on 05/07/2009   Method used:   Electronically to        [facility]* (mail-order)             ,          Ph: [phone]       Fax: [phone]   RxID:   [id number] ALTACE 2.5 MG  TABS (RAMIPRIL) 1 twice a day by mouth  #180 x 4   Entered by:   Sydell Axon LPN   Authorized by:   Shaune Leeks MD   Signed by:   Sydell Axon LPN on 84/13/2440   Method used:   Electronically to        [facility]* (mail-order)             ,          Ph: [phone]       Fax: [phone]   RxID:   [id number]   Current Allergies (reviewed today): ! TETRACYCLINE

## 2010-02-28 NOTE — Progress Notes (Signed)
----   Converted from flag ---- ---- 11/08/2009 5:41 PM, Shaune Leeks MD wrote:   ---- 11/07/2009 1:07 PM, Shaune Leeks MD wrote: bmet 401.1 hepatic chol prof tsh 272.0 cbc 414.00 psa 185  ---- 11/07/2009 11:56 AM, Liane Comber CMA (AAMA) wrote:  Lab orders please! Good Morning! This pt is scheduled for cpx labs tomorrow, which labs to draw and dx codes to use? Thanks Tasha ------------------------------

## 2010-06-05 ENCOUNTER — Telehealth: Payer: Self-pay | Admitting: Cardiology

## 2010-06-05 NOTE — Telephone Encounter (Signed)
Stress Faxed to [facility] @ [phone]  06/05/10/km

## 2010-06-11 NOTE — Op Note (Signed)
[name], ECONOMOU NO.:  [id number]   MEDICAL RECORD NO.:  [id number]          PATIENT TYPE:  AMB   LOCATION:  DSC                          FACILITY:  [facility]   PHYSICIAN:  Katy Fitch. Sypher, M.D. DATE OF BIRTH:  [birth year]   DATE OF PROCEDURE:  11/18/2007  DATE OF DISCHARGE:                               OPERATIVE REPORT   PREOPERATIVE DIAGNOSIS:  Chronic entrapment neuropathy, right median  nerve at carpal tunnel.   POSTOPERATIVE DIAGNOSIS:  Chronic entrapment neuropathy, right median  nerve at carpal tunnel.   OPERATION:  Release of right transverse carpal ligament.   OPERATING SURGEON:  Katy Fitch. Sypher, MD   ASSISTANT:  Marveen Reeks Dasnoit, PA-C   ANESTHESIA:  General by LMA.   SUPERVISING ANESTHESIOLOGIST:  Janetta Hora. 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 Laurita Quint of [facility] for  evaluation and management of right-hand numbness and discomfort.   Clinical examination revealed evidence of severe right carpal tunnel  syndrome.  Electrodiagnostic studies completed on October 25, 2007  revealed severe right carpal tunnel syndrome.   His past history is reviewed detail.  He is noted be allergic to  tetracycline.  He had had a Taxus-coated stent placed.  He is on chronic  Plavix per Dr. Juanda Chance.   Preoperatively, he was advised of the potential risks and benefits of  surgery.  It is our understanding at this point in time that individuals  with coated stents are to be maintained on Plavix.  Therefore, we  advised Mr. Buxton to proceed with hand surgery while still on Plavix.  This raises the risk of a significant bleeding complications with  surgery including hematoma and the need for evacuation at a later date.   After informed consent, he is brought to the operating room at this  time.   PROCEDURE:  Delena Bali is brought to the operating room and placed  in  supine position upon the operating table.   Following an anesthesia consult with Dr. Gelene Mink, general anesthesia  by LMA technique was recommended and accepted by Mr. Norberto.   The right arm was prepped with Betadine soap and solution and sterilely  draped.  A pneumatic tourniquet was applied to the proximal brachium.  Following exsanguination of the right arm with an Esmarch bandage, the  arterial tourniquet was inflated to 220 mmHg.  Procedure commenced with  a short incision in the line of the ring finger at the palm.  Subcutaneous tissues were carefully divided from the palmar fascia.  This was split longitudinally to the common sensory branch of the median  nerve.  The common sensory branches of the median nerve were followed to  the median nerve proper.  A Penfield 4 elevator was used to clear  pathway between the transverse carpal ligament and the median nerve and  flexor tenosynovium.  The ulnar aspect of the transverse carpal ligament  was then released with scissors subcutaneously into the distal forearm.  The volar forearm fascia was  likewise released.  Bleeding points along  the margin of the released ligament were meticulously electrocauterized  with bipolar forceps in an effort to prevent postoperative bleeding.  The subdermal plexus was inspected and carefully electrocauterized as  well.   The wound was then repaired with intradermal 3-0 Prolene suture.   A compressive dressing was applied with a volar plaster splint  maintaining the wrist in 5 degrees of dorsiflexion.   There were no apparent complications.   Mr. Dozier tolerated the surgery and anesthesia well.  He was transferred  to the recovery room with stable signs.      Katy Fitch Sypher, M.D.  Electronically Signed     RVS/MEDQ  D:  11/18/2007  T:  11/18/2007  Job:  [id number]   cc:   Arta Silence, MD

## 2010-06-11 NOTE — Assessment & Plan Note (Signed)
[facility]                            CARDIOLOGY OFFICE NOTE   Stephens, Shreve RAJINDER MESICK                         MRN:          [id number]  DATE:03/24/2007                            DOB:          30-Nov-1932    CARDIOLOGIST:  Everardo Beals. Juanda Chance, MD, [facility]   PRIMARY CARE PHYSICIAN:  Arta Silence, M.D.   ORTHOPEDIST:  Nadara Mustard, M.D.   CLINICAL HISTORY:  [name] is a 77 year old white male patient who was  scheduled for left shoulder surgery this past Tuesday and anesthesia  cancelled because he had not had cardiac clearance.  The patient has a  history of PCI of the LAD 11 years ago, and in May of 2005 underwent  placement of tandem overlapping Taxus stents in the LAD.  He has done  extremely well since then without any chest pain, shortness of breath,  dyspnea on exertion, palpitations, dizziness or presyncope.  He has not  required a stress test or a repeat cardiac catheterization.  He works on  a far and is limited only because of chronic back problems and shoulder  pain.   ALLERGIES:  __________.   PAST MEDICAL HISTORY:  1. Hypertension.  2. Hyperlipidemia.  3. Chronic back pain for which he has received 5 cortisone shots, but      he is due to see Dr. Channing Mutters at the end of March.  4. He has normal LV function.   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  He has never smoked.   REVIEW OF SYSTEMS:  Negative for dizziness or presyncopal signs or  symptoms, dyspepsia, dysphagia, nausea, vomiting, change in bowels or  melena.  CARDIOPULMONARY:  See HPI.   PHYSICAL EXAMINATION:  GENERAL:  This is a very pleasant 77 year old  white male in no acute distress.  VITAL SIGNS:  Blood pressure 130/77, pulse 62, weight 211.  HEENT:  Head is normocephalic without sign of trauma.  Extraocular  movements are intact.  Pupils equal, round and reactive to light and  accommodation.  Nasal mucosa is  moist.  Throat is without erythema or  exudate.  NECK:  Without JVD, HJR, bruit or thyroid enlargement.  LUNGS:  Clear anterior, posterior and lateral.  HEART:  Regular rate and rhythm at 62 beats per minute.  Normal S1 and  S2.  Positive S4.  No murmur heard.  ABDOMEN:  Soft without organomegaly, masses, lesions or abnormal  tenderness.  EXTREMITIES:  Without cyanosis, clubbing or edema.  He has good distal  pulses.  NEUROLOGIC:  Without focal deficit.   ELECTROCARDIOGRAM:  Normal sinus rhythm.  Normal EKG.   IMPRESSION:  1. Coronary artery disease status post PCI of the LAD 11 years ago      followed by tandem overlapping Taxus stents to the LAD in May of      2005, now stable.  2. Good LV function.  3. Hypertension.  4. Hyperlipidemia.  5. Left shoulder spurs and torn ligament for surgery by Dr. Lajoyce Corners.  6. Chronic  back pain for evaluation by Dr. Channing Mutters.   PLAN AT THIS TIME:  The patient will have a stress Cardiolite in the  next 2 days, and if this is normal, he should be at low risk for  shoulder surgery.      Jacolyn Reedy, PA-C  Electronically Signed      Bevelyn Buckles. Bensimhon, MD  Electronically Signed   ML/MedQ  DD: 03/24/2007  DT: 03/25/2007  Job #: [id number]   cc:   Everardo Beals. Juanda Chance, MD, [facility]  Arta Silence, MD  Nadara Mustard, MD

## 2010-06-11 NOTE — Op Note (Signed)
NAMESHERRI, LEVENHAGEN NO.:  [id number]   MEDICAL RECORD NO.:  [id number]          PATIENT TYPE:  INP   LOCATION:  3172                         FACILITY:  [facility]   PHYSICIAN:  Payton Doughty, M.D.      DATE OF BIRTH:  05-07-1932   DATE OF PROCEDURE:  04/13/2007  DATE OF DISCHARGE:                               OPERATIVE REPORT   PREOPERATIVE DIAGNOSIS:  Spondylosis and spinal stenosis at L4-L5.   POSTOPERATIVE DIAGNOSIS:  Spondylosis and spinal stenosis at L4-L5.   PROCEDURE:  L4-L5 laminotomy and foraminotomy done bilaterally.   SURGEON:  Payton Doughty, M.D.   ASSISTANT:  Coletta Memos, M.D.  [place]   ANESTHESIA:  General endotracheal anesthesia.   PREPARATION:  Betadine scrub and alcohol wipe.   COMPLICATIONS:  None.   BODY OF TEXT:  This 77 year old gentleman with neurogenic claudication  and spondylosis was taken to the operating room, smoothly anesthetized  and intubated, placed prone on the operating table. Following shave,  prep, and drape in the usual sterile fashion, the skin was infiltrated  with 1% lidocaine with 1:400,000 epinephrine.  The skin was incised from  L3 to L5. The lamina of L3, L4 and the top of L5 were exposed  bilaterally in a subperiosteal plane.  The intraoperative x-ray showed a  marker to be under L3.  A laminotomy was carried out at the next level  below.  The laminotomy was carried out with the high speed drill and the  K[place]ison to the top of the ligamentum flavum that was removed in  retrograde fashion of L4. The lamina of L5 was also undercut.  The L4  and L5 roots were dissected free and foraminotomy was carried out over  the top of them.  The wound was irrigated and hemostasis assured.  The  nerves were carefully explored and found to be without encumbrance.  Depo-Medrol soaked fat was placed in the laminotomy defect and  successive layers of 0 Vicryl, 2-0 Vicryl, and 3-0 nylon were used to  close.  Betadine and Telfa  dressing were applied and made occlusive with  OpSite.  The patient returned to the recovery room in good condition.           ______________________________  Payton Doughty, M.D.     MWR/MEDQ  D:  04/13/2007  T:  04/13/2007  Job:  [id number]

## 2010-06-11 NOTE — Assessment & Plan Note (Signed)
[facility]                            CARDIOLOGY OFFICE NOTE   [name], [name]                         MRN:          [id number]  DATE:10/02/2006                            DOB:          02/09/1932    PRIMARY CARE PHYSICIAN:  Dr. Laurita Quint.   CLINICAL HISTORY:  [name] is 77 years old and has had a PCI of the  LAD 11 year ago and in May 2005 underwent placement of Tandem  overlapping TAXUS stents in the LAD.  He has done quite well since that  time, has had no recent chest pain, shortness of breath or palpitations.  He has had some pain in both hips and legs when he walks.  He saw Dr.  Lajoyce Corners of orthopedics in the past and was told that he had some mild  arthritis of his hip and had some degenerative changes in his lumbar  spine.   PAST MEDICAL HISTORY:  Significant for hypertension and hyperlipidemia.   CURRENT MEDICATIONS:  Plavix, Lopressor, aspirin, Altace, Vytorin and  fish oil.   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   EXAMINATION:  The blood pressure is 121/77 with pulse 60 and regular.  There is no venous distention.  The carotid pulses were full without  bruits.  CHEST:  Clear.  CARDIAC:  Rhythm was regular, I could hear no murmurs or gallops.  ABDOMEN:  Soft with normal bowel sounds.  Peripheral pulses were full with no peripheral edema.   An electrocardiogram was normal.   IMPRESSION:  1. Coronary artery disease status post percutaneous coronary      interventions, the last of which was placed in tendem overlapping      TAXUS stents in the left anterior descending May 2005, now stable.  2. Good left ventricular function.  3. Hypertension.  4. Hyperlipidemia.   RECOMMENDATIONS:  I think [name] is doing quite well.  He has good  femoral and distal pulses and no bruits and I do not  think the symptoms  of hip and leg pain are related to peripheral vascular disease.  He is  to see Dr. Hetty Ely about this on his followup visit.  He is also  due for yearly labs with Dr. Hetty Ely in the future and will defer to  that.  I will plan to see him back for cardiac followup in a year.     Bruce Elvera Lennox Juanda Chance, MD, [facility]  Electronically Signed    BRB/MedQ  DD: 10/02/2006  DT: 10/02/2006  Job #: [id number]

## 2010-06-11 NOTE — Assessment & Plan Note (Signed)
[facility]                            CARDIOLOGY OFFICE NOTE   Willi, [name] CHADRICK SPRINKLE                         MRN:          [id number]  DATE:09/23/2007                            DOB:          27-Feb-1932    CLINICAL HISTORY:  Mr. Guard is a 77 year old returned for follow up  management of his coronary heart disease.  He had PCI of the LAD 11  years ago followed by tandem overlapping Taxus stents in the LAD in May  2005.  He has done quite well since that time.  We saw him in February  2009, for preop evaluation prior to shoulder surgery.  He got through  his shoulder surgery well and has been doing well since that time.  He  had no recent chest pain, shortness breath, or palpitations.   PAST MEDICAL HISTORY:  Significant for hypertension and hyperlipidemia.  He also has chronic low back pain.   CURRENT MEDICATIONS:  1. Plavix.  2. Aspirin.  3. Lopressor 50 mg b.i.d.  4. Altace 2.5 mg b.i.d.  5. Vytorin 10/40 nightly.  6. Fish oil.   PHYSICAL EXAMINATION:  VITAL SIGNS:  The blood pressure was 139/72 and  pulse 70 and regular.  NECK:  There is no venous tension.  The carotid pulses are full without  bruits.  CHEST:  Clear.  HEART:  Rhythm is regular.  I could not hear no murmurs or gallops.  ABDOMEN:  Soft with normal bowel sounds.  There is no  hepatosplenomegaly.  EXTREMITIES:  Peripheral pulse are full and no peripheral edema.   Electrocardiogram was normal.   IMPRESSION:  1. Coronary artery disease, status post percutaneous coronary      intervention 11 years ago, and status post overlapping stent in the      left anterior descending artery in May 2005, now stable.  2. Good left ventricular function.  3. Hypertension.  4. Hyperlipidemia.  5. Lumbosacral spine disease.   RECOMMENDATIONS:  I think Mr. Goodner is doing well.  Dr. Hetty Ely  following his lipid panel.  We will plan to see him back in follow up in  a year.     Bruce Elvera Lennox  Juanda Chance, MD, [facility]  Electronically Signed    BRB/MedQ  DD: 09/23/2007  DT: 09/24/2007  Job #: [id number]

## 2010-06-11 NOTE — H&P (Signed)
[name], HUBBERT NO.:  [id number]   MEDICAL RECORD NO.:  [id number]          PATIENT TYPE:  INP   LOCATION:  2899                         FACILITY:  [facility]   PHYSICIAN:  Payton Doughty, M.D.      DATE OF BIRTH:  09/13/1932   DATE OF ADMISSION:  04/13/2007  DATE OF DISCHARGE:                              HISTORY & PHYSICAL   ADMITTED AT [facility]:  April 13, 2007.   ADMISSION DIAGNOSIS:  Lumbar spondylosis.   The patient is a very nice self-referred 77 year old right-handed white  gentleman 2 years having pain in his back.  He got some epidural  steroids here and has been doing well.  Has had worsening his back pain  as well as claudicatory complaints his lower extremities.  He is only  comfortable when he is lying down.  The pain in his proximal legs pain,  when driving it goes down the peroneal side of his right leg.   MEDICAL HISTORY:  Marked for coronary artery disease.  He has 2 stents  in his heart.   MEDICATIONS:  Vytorin, Plavix, Altace, Lopressor, fish oil, calcium,  aspirin.  Uses Vicodin and Lyrica which does not seem to help him much.   He is allergic to TETRACYCLINE.   SOCIAL HISTORY:  Does not smoke.  Light social drinker.  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   FAMILY HISTORY:  Not given.   REVIEW OF SYSTEMS:  Marked for back pain, leg pain and coronary artery  disease.   EXAM:  HEENT exam within normal limits.  Has reasonable range of motion his neck.  CHEST:  Clear.  CARDIAC:  Regular rate and rhythm.  ABDOMEN:  Nontender with no hepatosplenomegaly.  EXTREMITIES: No clubbing, cyanosis.  GU: Exam is deferred.  Peripheral pulses are good.  NEUROLOGICALLY:  He is awake, alert and oriented.  Cranial Nerves are  intact.  Motor exam shows 5/5 strength throughout the upper and lower  extremities save for mild dorsiflexion weakness on the left side.  Sensory dysesthesias described in L5-S1 distribution  bilaterally.  Reflexes are 1 at the knees and 1 at the ankles.   MRI demonstrates spinal stenosis at 4/5.  Multifactorial and very focal.  His overall small canal but the pinch point is 4/5.   CLINICAL IMPRESSIONS:  Lumbar spondylosis neurogenic claudication.   PLAN:  For L4-5 laminotomy, foraminotomy done bilaterally.  The risks  and benefits have been discussed with him and he wishes to proceed.    .           ______________________________  Payton Doughty, M.D.     MWR/MEDQ  D:  04/13/2007  T:  04/13/2007  Job:  [id number]

## 2010-06-14 NOTE — Assessment & Plan Note (Signed)
[facility]                           GASTROENTEROLOGY OFFICE NOTE   NAME:[name], [name]                         MRN:          [id number]  DATE:11/26/2005                            DOB:          08/01/1932    This very nice patient comes in on October 31, a patient of Dr. Hetty Ely.  He was referred for colon screen, which was felt important, especially  because his mother was diagnosed with colon cancer at the age of 51.  He had  a sigmoidoscopic examination 20 years ago by Dr. Ethlyn Gallery.  He otherwise  has not had any problems.  He denies any real GI symptoms.  He sees Dr.  Hetty Ely and has some questionable early diabetes, he says, with some  slightly increasing fasting blood sugar levels, the last one being 124.  He  has also been diagnosed with prostate cancer.  He does have known  arteriosclerotic cardiovascular disease and has had two stents, placed by  Dr. Charlies Constable.  He says he is doing well with that.   His medications presently include Plavix, Lopressor, baby aspirin, calcium,  Vytorin, Altace, and fish oil.   His social history is really noncontributory.  He does occasionally chew  tobacco and drink some alcohol.  He is basically semi-retired.  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   His family history reveals only that his father had prostate cancer, and his  mother had colon cancer, as we indicated above.   Review of systems is really not very contributory.   PHYSICAL EXAMINATION:  GENERAL:  A healthy-looking man, 5 feet 11.  Weighs  198.  VITAL SIGNS:  Blood pressure 130/74, pulse 64 and regular.  Neck, heart and extremities all basically unremarkable.   IMPRESSION:  1. Status post prostate cancer.  2. Mother with colon cancer.  Patient for screening colonoscopic      examination.  3. Arteriosclerotic cardiovascular disease, on Plavix, and has required      two stents by Dr. Charlies Constable.  4. Slight increasing fasting  blood sugars, question early diabetes.   Recommendation is to schedule a colonoscopic examination on him and to talk  with Dr. Charlies Constable about whether to do this on or off Plavix and for  completeness, just get a hemoglobin A1C since this was not done when we  retrieved his last blood studies.   COMMENT:  It should be noted that he did indicate that Dr. Hetty Ely has  requested that he get colonoscopic examinations over the last number of  years with his physicals, but it appears that  he probably procrastinated and just has not gone through with it.  I did a  procedure on his wife recently, and I think that this encouraged him to do  so.    ______________________________  Ulyess Mort, MD    SML/MedQ  DD: 11/26/2005  DT: 11/27/2005  Job #: [id number]   cc:   Arta Silence, MD

## 2010-06-14 NOTE — Cardiovascular Report (Signed)
NAME:  [name], [name] NO.:  [id number]   MEDICAL RECORD NO.:  [id number]                   PATIENT TYPE:  INP   LOCATION:  2907                                 FACILITY:  [facility]   PHYSICIAN:  Carole Binning, M.D. [facility]         DATE OF BIRTH:  1932-04-09   DATE OF PROCEDURE:  06/02/2003  DATE OF DISCHARGE:                              CARDIAC CATHETERIZATION   PROCEDURE PERFORMED:  Percutaneous coronary intervention with placement of  drug-eluting stent in the proximal to mid left anterior descending artery.  ery.   INDICATION:  [name] is a 77 year old male with history of coronary artery  disease.  He was admitted to the hospital with unstable angina.  Cardiac  catheterization performed today revealed very complex disease in the  proximal LAD with a 95% stenosis in the proximal LAD followed by a long 80%  stenosis extending into the mid LAD.  There was moderate amount of calcium  in the LAD as well as bend in the mid portion of the lesion.  In addition,  there was a second diagonal branch which was at least moderate in size which  arose from within the 95% stenosis in the LAD.  The second diagonal branch  itself had a 99% stenosis at its ostium.  Options were discussed at length  with the patient including coronary artery bypass surgery versus  percutaneous coronary intervention.  He was advised that there is a very  high probability of occluding the diagonal branch with treatment of the left  anterior descending artery.  After discussion of the options, the patient  opted to proceed with percutaneous coronary intervention.   PROCEDURAL NOTE:  Pre-existing 6 French sheath in the right femoral artery  was exchanged over wire for 7 French sheath.  Heparin and integrilin were  administered per protocol.  We used a 7 [place] JL-4 guiding catheter.  An  Asahi soft wire was advanced under fluoroscopic guidance into the distal  LAD.  We then attempted to  pass a guide wire into the diagonal branch.  We  initially used an Office manager and then attempted to use a PT2 light  support wire.  However, although we were able to direct the tip of these  wires into the diagonal branch, we could not pass the subtotal occlusion at  the ostium of the diagonal branch.  During that time, the patient began to  develop significant chest pain.  We therefore decided to proceed with  percutaneous intervention of the left anterior descending to stabilize this  vessel.  We advanced a 3.0 x 20-mm Quantum balloon over our guide wire in  the LAD.  We inflated this to 8 atmospheres in the proximal portion of the  disease, 12 atmospheres in the distal portion of the disease and again to 12  atmospheres in the proximal portion of the disease.  Following balloon  inflation, there was  improvement in the vessel lumen and the LAD.  However,  there was a linear dissection in the LAD and there was a complete occlusion  of the diagonal branch.  We then advanced a 3.0 x 32-mm Taxus drug-eluting  stent and with some difficulty were able to advance this stent across the  lesion and this did cover the entire length of the lesion in the LAD.  We  deployed this stent at 12 atmospheres.  We then back in with a 3.25 x 20-mm  Quantum balloon and inflated this to 15 atmospheres in the distal aspect of  the stent, 18 atmospheres in the mid aspect of the stent and 15 atmospheres  in the proximal aspect of the stent.  Intermittent doses of nitroglycerin  were administered.  Final angiographic images were obtained revealing  patency of the LAD with 0% residual stenosis at the stent site and TIMI-3  flow into the distal vessel.  The diagonal branch was 100% occluded.  However, it was seen filling very well via grade 3 left-to-left collaterals  at the conclusion of the case.  In addition, the patient was chest pain free  at the conclusion of the procedure.   COMPLICATIONS:  Occlusion  of the diagonal branch as described above.  However, the diagonal is well collateralized and the patient was free of  chest pain.   PLAN:  Integrilin will be continued for 18 hours.  It is recommended the  patient be treated with Plavix for 12 months.  He also needs aggressive risk  factor modification and medical therapy for his residual coronary artery  disease.                                               Carole Binning, M.D. [facility]    MWP/MEDQ  D:  06/02/2003  T:  06/03/2003  Job:  [id number]   cc:   Laurita Quint, M.D.  [address] [place]  [place] [address]  Fax: [phone]   Charlies Constable, M.D. [facility]

## 2010-06-14 NOTE — Cardiovascular Report (Signed)
NAME:  [name], [name] NO.:  [id number]   MEDICAL RECORD NO.:  [id number]                   PATIENT TYPE:  INP   LOCATION:  2907                                 FACILITY:  [facility]   PHYSICIAN:  Rollene Rotunda, M.D.                DATE OF BIRTH:  [birth year]   DATE OF PROCEDURE:  06/02/2003  DATE OF DISCHARGE:                              CARDIAC CATHETERIZATION   PROCEDURES PERFORMED:  1. Left heart catheterization.  2. Coronary arteriography.   CARDIOLOGIST:  Rollene Rotunda, M.D.   INDICATIONS:  Evaluate patient with unstable angina.  He had previous  angioplasty in 1990 (records not available).   PROCEDURAL NOTE:  Left heart catheterization was performed via the right  femoral artery.  The artery was cannulated using an anterior wall puncture.  A #6 French arterial sheath was inserted via the modified Seldinger  technique.  Preformed Judkins and a pigtail catheters were utilized.   The patient tolerated the procedure well and left the lab in stable  condition.   RESULTS:   HEMODYNAMIC DATA:  LV 155/16.  Aortic output 155/71.   ANGIOGRAPHIC DATA:  Coronaries  Left Main:  The left main was normal.   LAD:  The LAD had proximal luminal irregularities.  There was a proximal 99%  stenosis in the first diagonal.  There was a mid long 50% stenosis followed  by a mid 40% stenosis, followed a distal 60% stenosis.  The first diagonal  was moderate size with ostial 99% stenosis and TIMI II flow.   Circumflex:  The circumflex and the AV groove were normal.  There was a  ramus intermedius, which was moderate in size with ostial 70-80% stenosis.  The OM-1 was large with proximal 25% stenosis.   Right Coronary Artery:  The right coronary artery was a dominant vessel.  There was an ostial 25% with proximal long diffuse plaquing and mid 40%  stenosis.  The PDA was of moderate size with ostial 70% stenosis.   VENTRICULOGRAPHIC DATA:  Left Ventriculogram:  Th  left ventriculogram was  obtained in the RAO projection.  The EF was 65% with normal wall motion.   CONCLUSION:  High-grade left anterior descending stenosis with the proximal  left anterior descending very likely to be the culprit for his unstable  angina.  He has moderate disease in the circumflex and right coronary artery  systems.   PLAN:  I will review the LAD for possible percutaneous revascularization.  I  will discuss this with Dr. Gerri Spore.                                               Rollene Rotunda, M.D.    JH/MEDQ  D:  06/02/2003  T:  06/03/2003  Job:  [id number]  cc:   Laurita Quint, M.D.  [address] [place]  [place] [address]  Fax: [phone]

## 2010-06-14 NOTE — Op Note (Signed)
NAME:  [name], [name] NO.:  [id number]   MEDICAL RECORD NO.:  [id number]                   PATIENT TYPE:  INP   LOCATION:  0002                                 FACILITY:  [facility]   PHYSICIAN:  Sigmund I. Patsi Sears, M.D.         DATE OF BIRTH:  1932-12-14   DATE OF PROCEDURE:  01/09/2003  DATE OF DISCHARGE:                                 OPERATIVE REPORT   PREOPERATIVE DIAGNOSIS:  Adenocarcinoma of the prostate.   POSTOPERATIVE DIAGNOSIS:  Adenocarcinoma of the prostate.   PROCEDURE:  Radical retropubic prostatectomy with bilateral pelvic lymph  node dissection.   SURGEON:  Sigmund I. Patsi Sears, M.D.   ASSISTANTS:  1. Lindaann Slough, M.D.  2. Susanne Borders, M.D.   ANESTHESIA:  General endotracheal.   DRAINS:  28 French Foley catheter to straight drain, #10 Blake drain to bulb  suction.   SPECIMENS:  1. Bilateral pelvic lymph nodes for pathologic analysis.  2. Prostate for pathology.   ESTIMATED BLOOD LOSS:  3 L.   COMPLICATIONS:  None.   DISPOSITION:  Postanesthesia care unit in stable condition.   BRIEF INDICATION FOR PROCEDURE:  [name] is a 77 year old who was  evaluated by Dr. Patsi Sears for elevated PSA.  The patient had a family  history of prostate cancer.  He subsequently underwent a transrectal  ultrasound-guided prostate biopsy which demonstrated Gleason's 3 + 3  adenocarcinoma involving the left and the right side of his prostate as well  as the apex.  Various treatment options were discussed with the patient,  including radiation therapy and watchful waiting.  The patient decided to  undergo radical retropubic prostatectomy after understanding the risks,  benefits, and alternatives.   DESCRIPTION OF PROCEDURE:  The patient was brought to the operating room and  correctly identified by his identification bracelet.  He was given  preoperative antibiotics and general endotracheal anesthesia.  He was  shaved, prepped, and  draped in typical sterile fashion and placed in the  supine position with the table flexed.  An approximately 8 cm infraumbilical  midline incision was made using a scalpel.  Bovie electrocautery was used to  incise through Camper's and Scarpa's fascia down to the rectus sheath.  This  fascia was incised with Bovie electrocautery in the midline.  The  transversalis fascia was incised with electrocautery as well, incising the  prevesical space of Retzius.  This space was further defined with blunt  dissection using a surgeon's hand and a Kitner.  The Omni-Track retractor  was placed and the pelvic lymph node dissection was performed on both sides.  The anterior extent of the lymph node dissection was the external iliac vein  and the posterior extent was the obturator nerve and vessels.  Superiorly  the nodes were taken to the level of the ureteral crossing of the iliac.  Inferiorly the nodes were taken down to the obturator nodes.  This  was done  on both sides and dissection was performed using Metzenbaum scissors.  Hemoloc clips were placed proximally and distally on the lymph node packets  before incision to prevent lymphocele formation.  After the lymph nodes were  taken, the Omni-Track retractor was placed such that the bladder was  retracted superiorly.  The Kitner was used to clean off the areolar tissue  overlying the endopelvic fascia bilaterally.  The endopelvic fascia was  scored with a long-handled 15 blade scalpel.  Metzenbaum scissors were used  to further take down the endopelvic fascia.  The puboprostatic ligaments  were identified after clearing the areolar tissue overlying it with a  Kitner.  The puboprostatics were incised on both sides with Metzenbaum  scissors, dropping the apex of the prostate away from the pubis. The apex of  the prostate could then be palpated with surgeon's fingers in the groove  between the anterior urethra, and the posterior dorsal venous complex  was  palpable between surgeon's forefinger and thumb.  Hoppenfeld right angle was  passed between this groove and a #1 Vicryl tie was passed underneath the  dorsal venous complex and the complex was ligated.  A second suture was  placed underneath the dorsal venous complex.  A third suture ligature using  2-0 Vicryl was placed through dorsal venous complex.  The Hoppenfeld was  replaced and the dorsal venous complex was incised with a long-handled 15  blade scalpel.  There was minimal bleeding after this incision.  The urethra  could then be palpated and was identified beneath the dorsal venous complex.  The lateral tissue beside the urethra was cleaned off with a Metzenbaum  scissors.  The Hoppenfeld right angle was passed underneath the urethra and  the urethra was retracted anteriorly.  A long-handled scalpel was used to  incise the anterior two-thirds of the urethra.  The valve stem was cut and  well-lubricated and the cut end of the Foley was pulled into the wound and  clamped with a Kelly.  The posterior urethra was incised with Metzenbaum  scissors.  There was minimal rectourethralis musculature, which was  carefully dissected away from Denonvillier's fascia using a right angle and  Metzenbaum scissors.  The plane between Denonvillier's fascia and the  posterior capsule could then be established bluntly with surgeon's finger.  The lateral pedicles of the prostate were taken down on both sides using a  right angle and placing large clips.  The bundles were incised using a  Harmonic scalpel.  This was done on both sides until the lateral attachments  were nearly freed in their entirety.  The Denonvillier's fascia beneath the  prostate was incised with a scalpel and Metzenbaums were used to dissect out  the seminal vesicles and ampulla of the vas posteriorly.  This was very deep  in the patient's pelvis and this dissection was somewhat difficult, so attention was turned to the bladder  neck dissection.  Bovie electrocautery  was used to establish a plane between the base of the prostate and the  bladder neck.  A Vanderbilt was used to carefully dissect these fibers, and  they were incised with Bovie electrocautery.  This was done on both sides  until the bladder neck could be carefully surrounded with the right angle.  The bladder neck at this area was incised overlying the fever or chills.  The Tresa Endo was released from the cut end of the Foley catheter and the  balloon was drained, and then the balloon  end was pulled from the bladder  and clamped in the [place].  The posterior bladder neck was incised with  Metzenbaum scissors.  The posterior attachments between the base of the  prostate and the bladder neck were carefully dissected using a right angle  and a Harmonic scalpel.  There were some remaining lateral attachments,  which were taken down with a Harmonic scalpel on both sides.  The only  remaining attachments to this point were the bilateral vas deferens and  seminal vesicles.  These were carefully dissected anteriorly and large clips  were placed around both vasa, and they were incised with Metzenbaum  scissors.  The seminal vesicles were dissected posteriorly and large Hemoloc  clips were placed on them proximally and they were incised bilaterally,  freeing the prostate entirely.  There was a persistent ooze of blood  throughout the patient's pelvis during this entire aspect of the case.  There was never one large hemorrhage.  The bladder neck required some degree  of reconstruction and single 2-0 Vicryl sutures were placed in the lateral  corners of the bladder neck defect.  Two interrupted Vicryl sutures were  placed at 6 o'clock and this created a bladder neck of the appropriate size.  The mucosa was then everted with approximately six interrupted 4-0 Vicryl  sutures.  This gave [place] a very nice bladder neck.  The anastomosis was then  done using a Greenwald  sound.  Anastomotic sutures were placed at 2, 5, 7,  10, and 12 o'clock.  These sutures were interrupted 2-0 Monocryl.  The  anastomotic sutures were placed in their corresponding location at the  bladder neck.  The Foley catheter was placed before the anterior sutures  were placed through the bladder neck.  The bladder was then released from  traction, allowed to drop down into the pelvis.  These sutures were then  pulled snugly and tied down with several throws.  The Foley catheter then  irrigated nicely with no leakage of urine around the anastomosis.  A #10  Blake drain was placed in the left lower quadrant by making a small stab  incision in the anterior body wall and passing a tonsil through the fascia  and pulling the drain back through the body wall.  This was secured with a 2-  0 nylon.  The entire wound was then copiously irrigated with antibiotic  solution.  The rectus fascia was approximated with a #1 PDS in a running fashion.  The skin was copiously irrigated and the skin was closed with a  stapling device.  Please note that Dr. Patsi Sears was present and  participated in all aspects of this case.  There were no complications.  The  patient awakened from his anesthesia without difficulty and was taken to the  postanesthesia care unit in stable condition, having tolerated the procedure  well.     Susanne Borders, MD                           Sigmund I. Patsi Sears, M.D.    DR/MEDQ  D:  01/09/2003  T:  01/09/2003  Job:  [id number]

## 2010-06-14 NOTE — Cardiovascular Report (Signed)
NAME:  [name], CUTHRELL NO.:  [id number]   MEDICAL RECORD NO.:  [id number]                   PATIENT TYPE:  INP   LOCATION:  6532                                 FACILITY:  [facility]   PHYSICIAN:  Charlies Constable, M.D. [facility]              DATE OF BIRTH:  Jun 07, 1932   DATE OF PROCEDURE:  06/06/2003  DATE OF DISCHARGE:  06/07/2003                              CARDIAC CATHETERIZATION   PROCEDURE:  Cardiac catheterization and percutaneous intervention.   CARDIOLOGIST:  Charlies Constable, M.D.   INDICATIONS:  Mr. [name] is 77 years old and had a remote intervention by  myself about 10 years ago.  He was admitted to the hospital with chest pain,  felt to represent unstable angina and was studied last week by Dr. Antoine Poche  and underwent intervention with a stent placed in the proximal LAD by Dr.  Gerri Spore.  A diagonal branch was occluded with stenting and this was unable  to be entered but was well filled by collaterals.  He had recurrent chest  pain last Saturday night and decision was made to re-evaluate with  angiography today.   DESCRIPTION OF PROCEDURE:  The procedure was performed via the right femoral  artery using an arterial sheath and 6 French preformed coronary catheters.  A front wall arterial puncture was performed, and Omnipaque contrast was  used.  After completion of the diagnostic study and after discussion with  Dr. Gerri Spore we made a decision to proceed with intervention on tandem  lesions in the mid LAD.  Dr. Gerri Spore made a decision not to treat these  weighing the downside of multiple long-standing insertions where the lesions  were tight enough to be significant.  In view of the recurrent symptoms, we  decided to go ahead and proceed with intervention.   The patient was given Angiomax bolus and infusion and had been on Plavix.  We predilated with a 2.75 x 20 mm Quantum Maverick performing two inflations  up to 8 atmospheres for 30 seconds.  We  then deployed at 2.75 x 32 mm Taxus  stent deploying this one inflation for 8 atmospheres for 30 seconds to avoid  over expansion of the distal edge.  We then post dilated with a 2.75 x 20 mm  Quantum Maverick performing two inflations to 16 atmospheres for 30 seconds  avoiding the distal edge.  Repeat diagnostic studies were then performed  through the guiding catheter.  The patient tolerated the procedure well and  left the laboratory in satisfactory condition.   RESULTS:  The left main coronary artery:  The left main coronary artery was  free of significant disease.   Left anterior descending:  The left anterior descending artery gave rise to  a septal perforator, a first diagonal branch which had TIMI-1 flow, a septal  perforator and second diagonal branch.  There was 0% stenosis at the stent  site in  the proximal LAD.  The first diagonal was occluded with TIMI-1 flow  but filled by collaterals from the distal LAD.  There were tandem 70% and  80% stenoses in the mid left anterior descending artery.   Circumflex artery:  The circumflex artery gave rise to a ramus branch, a  marginal branch and a posterior lateral branch.  These vessels were free of  significant disease.   Following stenting of the lesions in the mid left anterior descending  artery, the stenoses was improved from 70, 70 and 80% to 0%.   DISPOSITION:  The patient was returned to post anesthesia for further  observation.   CONCLUSION:  1. No evidence of restenosis at the previously placed stent site in the     proximal left anterior descending (0%) with total occlusion of the first     diagonal branch to the LAD which fills well by collaterals, tandem 70%     and 80% stenoses in the mid LAD and no major obstruction of the     circumflex artery.  2. Successful stenting of the lesions in the mid LAD with improvement in the     % diameter narrowing from 70%, 70% and 80% to 0% with one long Taxus     stent.                                                Charlies Constable, M.D. [facility]    BB/MEDQ  D:  06/06/2003  T:  06/07/2003  Job:  [id number]   cc:   Laurita Quint, M.D.  [address] [place]  [place] [address]  Fax: [phone]   Carole Binning, M.D. [facility]

## 2010-06-14 NOTE — H&P (Signed)
NAME:  [name], [name] NO.:  [id number]   MEDICAL RECORD NO.:  [id number]                   PATIENT TYPE:  INP   LOCATION:  1825                                 FACILITY:  [facility]   PHYSICIAN:  Willa Rough, M.D.                  DATE OF BIRTH:  05/17/1932   DATE OF ADMISSION:  06/01/2003  DATE OF DISCHARGE:                                HISTORY & PHYSICAL   HISTORY OF PRESENT ILLNESS:  The patient is a 77 year old white male with  history of angioplasty about 10 years ago by Charlies Constable, M.D. [facility], who is  presenting now with about 10 days of exertional chest pain. The pain comes  on with exertion and lasts approximately two minutes after he stops to rest  with relief this morning around 8:30 a.m. when the patient was at rest. The  same type of chest pain came on.  Chest pain is described as tightness over  the right chest wall with no radiation. The patient does report diaphoresis  and shortness of breath along with the chest pain, but no nausea, vomiting,  or lightheadedness.  The pain today lasted for approximately one hour and  subsided on its own with no intervention.  No nitroglycerin was given. The  patient is not having pain at the time of the history and examination.   PAST MEDICAL HISTORY:  1. Catheterization in 1996 by Dr. Charlies Constable with angioplasty performed of     the LAD.  2. Hyperlipidemia.  3. Hypertension.  4. Prostate cancer, status post prostatectomy in December of 2004.   MEDICATIONS:  1. Lipitor 20 mg daily.  2. Norvasc 5 mg daily.  3. Aspirin daily.  4. Calcium supplements.   SOCIAL HISTORY:  He lives in [place] with his wife. He is retired.  He  smokes cigars occasionally.  He drinks about one to two glasses of wine per  night.  He follows a low fat diet and does not take any herbal medications.   FAMILY HISTORY:  Father had prostate cancer.  Mother had coronary artery  disease in her 24's.  He has one son who had  CABG at age 72.   REVIEW OF SYSTEMS:  Negative except for HPI.   PHYSICAL EXAMINATION:  VITAL SIGNS:  The patient is afebrile with a  temperature of 98.1, pulse 86, blood pressure initially 182/91, this came  down to 148/72 during examination.  O2 saturation of 99% on room air.  GENERAL:  The patient is well appearing and in no acute distress.  HEENT:  Pupils equal, round, and reactive to light.  Sclerae clear.  NECK:  Without bruits or JVD.  HEART:  Regular rate and rhythm with no murmurs, rubs, or gallops.  Normal  PMI.  Pulses are 2+ and equal with no audible bruits.  LUNGS:  Clear to auscultation, slightly decreased air movement.  SKIN:  Diffuse seborrheic keratosis.  ABDOMEN:  Soft and nontender. Positive bowel sounds in all four quadrants.  No organomegaly.  EXTREMITIES:  With cool lower extremities, trace edema, and no bruits.  NEUROLOGY:  Cranial nerves II-XII grossly intact.  The patient has  approximately 5/5 strength in all extremities, symmetric.  No focal  deficits.  Chest x-ray is pending at the time of this dictation.  EKG has  rate of 82 beats per minute.  Normal sinus rhythm, normal axis, normal  intervals. No Q waves, ST changes, or signs of hypertrophy.   LABORATORY DATA:  CBC; white blood cell count 5.1, hemoglobin 14.3, platelet  count 195,000. CMET and coags are pending at the time of this dictation.  Point of care enzymes; CK-MB 1.2, troponin I less than 0.05, myoglobin 47.5.   ASSESSMENT:  1. Acute coronary syndrome.  History is consistent with unstable angina. The     patient is going to be admitted to a stepdown unit, started on heparin     and nitroglycerin drip. He will be continued on his aspirin and Lipitor.     Also will be started on Lopressor.  Will continue to cycle enzymes and     repeat EKG in the morning.  Current plan is to try to perform cardiac     catheterization tomorrow.  2. Hyperlipidemia.  The patient is to be continued on his Lipitor 20  mg     daily.  3. Hypertension.  Blood pressure will be controlled while in the hospital     with nitroglycerin drip and Lopressor.      Manning Charity, MD                        Willa Rough, M.D.    KK/MEDQ  D:  06/01/2003  T:  06/01/2003  Job:  [id number]

## 2010-06-14 NOTE — Assessment & Plan Note (Signed)
[facility]                              CARDIOLOGY OFFICE NOTE   Raman, Featherston LEAM MADERO                         MRN:          [id number]  DATE:09/25/2005                            DOB:          1932/06/30    PRIMARY CARE PHYSICIAN:  Arta Silence, MD.   CLINICAL HISTORY:  Mr. Paulos is 77 years old and had PCI of the left  anterior descending artery 10 years ago and in May 2005 underwent placement  of tandem overlapping stents to the LAD.  These were Taxus drug-eluting  stents.  He has done great since that time.  Has had no recurrent chest  pain, shortness of breath or palpitations.  He did have an episode of  bleeding after he cut himself shaving which persisted for a number of hours  while he was in a hotel out of town.  This happened last week.   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   PAST MEDICAL HISTORY:  Significant for hypertension and hyperlipidemia.   CURRENT MEDICATIONS:  1. Include Plavix.  2. Lopressor.  3. Aspirin.  4. Calcium.  5. Altace.  6. Fish oil.  7. Vytorin.   EXAMINATION:  Blood pressure was 120/72 and the pulse 60 and regular.  There  was no venous distension.  The carotid pulses were full without bruits.  CHEST:  Clear.  CARDIAC:  Heart rhythm was regular.  No murmurs or gallops.  ABDOMEN:  Soft without organomegaly.  Peripheral pulses were full.  There is  no peripheral edema.   Electrocardiogram was normal.   IMPRESSION:  1. Coronary artery disease status post remote percutaneous coronary      intervention of the left anterior descending and status post placement      of tandem overlapping drug-eluting stents in the left anterior      descending, May 2005, now stable.  2. Hypertension, treated.  3.  Hyperlipidemia, treated.   RECOMMENDATIONS:  I think Mr. Leckey is doing quite well.  I talked to him  about the Plavix issues and I have recommended that he stay on Plavix unless  he has more frequent episodes of bleeding such as he had last week.  Dr.  Hetty Ely is due to see him for a complete physical soon and will let him get  the blood tests including the lipid panel.  I will plan to see him back  followup in a year.                               Bruce Elvera Lennox Juanda Chance, MD, [facility]    BRB/MedQ  DD:  09/25/2005  DT:  09/26/2005  Job #:  [id number]

## 2010-06-14 NOTE — Discharge Summary (Signed)
NAME:  [name], [name] NO.:  [id number]   MEDICAL RECORD NO.:  [id number]                   PATIENT TYPE:  INP   LOCATION:  6532                                 FACILITY:  [facility]   PHYSICIAN:  Arturo Morton. Riley Kill, M.D. [facility]         DATE OF BIRTH:  1932/09/15   DATE OF ADMISSION:  06/01/2003  DATE OF DISCHARGE:  06/07/2003                           DISCHARGE SUMMARY - REFERRING   DISCHARGE DIAGNOSES:  1. Non-ST segment elevated myocardial infarction.  2. Coronary artery disease, status post PCI to the left anterior descending.  3. Hyperlipidemia, treated.  4. Hypertension, treated.  5. History of prostate cancer, status post surgery.  6. Rash with tetracycline.   HOSPITAL COURSE:  Mr. Maulden is a 77 year old male patient with a history of  an angioplasty approximately two days prior to admission. He presented to  the emergency room with a 10-day history of exertional chest pain. On the  morning of admission, the pain started with rest and was associated with  diaphoresis and shortness of breath. He presented to [facility] Emergency Room  and was placed on heparin as well as nitroglycerin. His cardiac isoenzymes  were normal; however, his troponins were elevated at 0.31 consistent with  non-Q-wave myocardial infarction. His EKG shows normal sinus rhythm with a  rate of 67 with no acute changes.   He ultimately underwent cardiac catheterization that revealed an EF of 65%,  in the right coronary artery  there was a 25% lesion followed by a 40%  lesion, with a 70% distal lesion. The left system showed circumflex  essentially angiographically normal. The LAD had no instant restenosis at  the previous site. The diagonal was total but did have collateral. There was  a proximal 70 followed by a 70 followed by an 80% mid-LAD lesion. This area  underwent PTCA/stent placement, utilizing a Taxus stent. The result was  better than 0% stenosis postprocedure. The patient  tolerated the procedure  well and was ready for discharge to home in the morning. Cardiac enzymes  were normal the following morning.   DISCHARGE MEDICATIONS:  He was discharged to home on the following  medications:  1. Plavix 75 mg a day which he will need to stay on for six months to a     year.  2. Enteric-coated aspirin 325 mg a day.  3. Lopressor 50 mg one p.o. b.i.d.  4. Lipitor 20 mg a day.  5. He may resume his calcium.  6. Use Tylenol 1-2 tablets every six hours as needed for pain.   He needs to discontinue his Norvasc.   ACTIVITY:  No serious activity or lifting over 10 pounds for one week. He  may shower but cannot use tub bathing for one week.   DIET:  Remain on a low-fat diet.   Clean over catheterization site with soap and water, no scrubbing. He will  have a groin check with a  physician assistant on Jun 21, 2003 at 4 p.m. The  next follow-up appointment with Dr. Juanda Chance that is available is on July 26, 2003 at 2:45 p.m., and I have set this up for the patient.      Guy Franco, P.A. [facility]                      Thomas D. Riley Kill, M.D. [facility]    LB/MEDQ  D:  06/07/2003  T:  06/07/2003  Job:  [id number]   cc:   Laurita Quint, M.D.  [address] [place]  [place] [address]  Fax: [phone]   Charlies Constable, M.D. [facility]

## 2010-06-14 NOTE — H&P (Signed)
NAME:  [name], [name] NO.:  [id number]   MEDICAL RECORD NO.:  [id number]                   PATIENT TYPE:  INP   LOCATION:  0372                                 FACILITY:  [facility]   PHYSICIAN:  Sigmund I. Patsi Sears, M.D.         DATE OF BIRTH:  10-03-1932   DATE OF ADMISSION:  01/09/2003  DATE OF DISCHARGE:                                HISTORY & PHYSICAL   HISTORY OF PRESENT ILLNESS:  This is a 77 year old male who was seen by Dr.  Patsi Sears for a PSA elevation referred by Dr. Laurita Quint.  The patient  has a history of PSAs which have been 2.0-2.3 in the past, but suddenly rose  to 4.0 and 4.1 at this time.  He has a past history that is significant for  a father who died secondary to carcinoma in the prostate, but lived x14  years after his diagnosis was made.  The patient underwent biopsy of the  prostate in the office and he was found to have an adenocarcinoma of the  prostate, Gleason 3 + 3 = 6, in the left lateral, in the left central, the  right lateral and the apex.  Treatment plans were discussed with the patient  and his wife and they have decided to do a radical prostatectomy.   PAST MEDICAL HISTORY:  1. Hypertension.  2. Hypercholesterolemia.  3. Angioplasty in 1996.   SOCIAL HISTORY:  The patient is married.  He denies any tobacco use.  He  states he drinks about one to two glasses every other day.   MEDICATIONS:  1. Lipitor 20 mg p.o. q.d.  2. Norvasc 5 mg p.o. q.d.  3. Aspirin p.o. q.d.  4. Allegra p.r.n.   ALLERGIES:  TETRACYCLINE.   REVIEW OF SYMPTOMS:  Noncontributory, other than stated in the history of  present illness.   PHYSICAL EXAMINATION:  GENERAL:  A 77 year old, white male, well-developed,  well-nourished in no acute distress.  HEENT:  Normocephalic.  Neck supple.  No lymphadenopathy noted.  CARDIAC:  Regular rate and rhythm with no murmurs noted.  LUNGS:  Clear to auscultation bilaterally.  ABDOMEN:  Soft,  positive bowel sounds.  No masses noted.  GENITOURINARY:  3+ prostate with right-sided prostate nodule.  NEUROLOGIC:  Grossly intact.  EXTREMITIES:  No edema noted.   LABORATORY DATA AND X-RAY FINDINGS:  Prostate ultrasound shows a 45 cc  gland.  Bone scan shows arthritis of shoulders only and CT is negative.   ASSESSMENT:  Prostate cancer, Gleason 3 + 3 = 6, in the left lateral, in the  left central, the right lateral and the apex.   PLAN:  As discussed with the patient and his wife, we will take him to  hospital for radical prostatectomy.     [place], [place].P.  Sigmund I. Patsi Sears, M.D.    HB/MEDQ  D:  01/10/2003  T:  01/10/2003  Job:  [id number]

## 2010-06-14 NOTE — Discharge Summary (Signed)
NAME:  [name], [name] NO.:  [id number]   MEDICAL RECORD NO.:  [id number]                   PATIENT TYPE:  INP   LOCATION:  0372                                 FACILITY[facility]  [facility]   PHYSICIAN:  Sigmund I. Patsi Sears, M.D.         DATE OF BIRTH:  September 05, 1932   DATE OF ADMISSION:  01/09/2003  DATE OF DISCHARGE:  01/12/2003                                 DISCHARGE SUMMARY   HISTORY OF PRESENT ILLNESS:  This is a 77 year old male who was seen by Dr.  Patsi Sears for a PSA elevation and referred by Laurita Quint, M.D.  The  patient has a history of PSA's which have bee 2.0 to 2.3 in the past, but  suddenly rose to 4.0 and 4.1 at this time.  He has had a past history that  is significant for a father who died secondary to carcinoma of the prostate  but lived x14 years after his diagnosis was made.  The patient underwent  biopsy of the prostate in the office and was found to have an adenocarcinoma  of the prostate, Gleason 3+3=6 in the left lateral, in the left central, the  right lateral and apex.  Treatment plans were discussed with the patient and  the patient and his wife decided to do a radical prostatectomy.  The patient  was admitted to [facility] for radical prostatectomy.   PAST MEDICAL HISTORY:  1. Hypertension.  2. Hypercholesterolemia.  3. Angioplasty in 1996.   SOCIAL HISTORY:  The patient is married.  He denies any tobacco use. He  states he drinks about 1-2 glasses of wine every other day.   ALLERGIES:  TETRACYCLINE.   HOSPITAL COURSE:  The patient tolerated procedure well without any  significant difficulties.  He was transfused in the operating room with two  units of blood.  His hemoglobin remained stable throughout his postoperative  hospital course.  On day two of his postop course, the patient was  ambulating well and eating regular food. On day three of his postoperative  stay, the patient had a bowel movement and continued  to do well. He was  stable and ready to go home and was discharged on day four with his Foley  catheter in place.  He will have his Foley catheter for the next week and a  half to two weeks and he will come into the office to have staples removed  and a voiding trial done at that time.   DISCHARGE MEDICATIONS:  1. Resume his home medications.  2. Cipro 500 mg p.o. b.i.d.  3. Percocet 5/325 for pain.  4. Colace over the counter as a stool softener.   CONDITION ON DISCHARGE:  Stable.   PLAN:  The patient will go home with a leg bag and an overnight bag for his  catheter.  He will also followup in the office for Foley removal and staple  removal.  Terri Piedra, N.P.                         Sigmund I. Patsi Sears, M.D.    HB/MEDQ  D:  01/13/2003  T:  01/13/2003  Job:  [id number]

## 2010-07-24 ENCOUNTER — Other Ambulatory Visit: Payer: Self-pay | Admitting: Family Medicine

## 2010-08-08 ENCOUNTER — Encounter: Payer: Self-pay | Admitting: Cardiovascular Disease

## 2010-09-04 ENCOUNTER — Other Ambulatory Visit: Payer: Self-pay | Admitting: *Deleted

## 2010-09-04 MED ORDER — METOPROLOL TARTRATE 50 MG PO TABS: 50.0000 mg | ORAL_TABLET | Freq: Two times a day (BID) | ORAL | Status: DC

## 2010-09-13 ENCOUNTER — Ambulatory Visit (INDEPENDENT_AMBULATORY_CARE_PROVIDER_SITE_OTHER): Payer: Medicare Other | Admitting: Cardiovascular Disease

## 2010-09-13 ENCOUNTER — Encounter: Payer: Self-pay | Admitting: *Deleted

## 2010-09-13 ENCOUNTER — Encounter: Payer: Self-pay | Admitting: Cardiovascular Disease

## 2010-09-13 VITALS — BP 115/58 | HR 51 | Ht 71.0 in | Wt 211.8 lb

## 2010-09-13 DIAGNOSIS — I10 Essential (primary) hypertension: Secondary | ICD-10-CM

## 2010-09-13 DIAGNOSIS — I2510 Atherosclerotic heart disease of native coronary artery without angina pectoris: Secondary | ICD-10-CM

## 2010-09-13 MED ORDER — NITROGLYCERIN 0.4 MG SL SUBL: 0.4000 mg | SUBLINGUAL_TABLET | SUBLINGUAL | Status: DC | PRN

## 2010-09-13 MED ORDER — CLOPIDOGREL BISULFATE 75 MG PO TABS: 75.0000 mg | ORAL_TABLET | Freq: Every day | ORAL | Status: DC

## 2010-09-13 NOTE — Assessment & Plan Note (Signed)
Stable. No changes in therapy.  

## 2010-09-13 NOTE — Assessment & Plan Note (Signed)
BP well controlled. No changes.  

## 2010-09-13 NOTE — Progress Notes (Signed)
History of Present Illness:77 yo WM with h/o CAD, HTN, HLD here today for cardiac follow up. She has been followed in the past by Dr. Juanda Chance. In 1998 he had PTCA of the LAD. In 2005 he had a drug-eluting stent to the LAD and then a short time later had a second drug-eluting stent to the LAD overlapping the first for what I believe was an edge tear.   He has done quite well since that time has had no recent chest pain, shortness of breath or palpitations. No dizziness, near syncope or syncope. He did have some dizziness with standing a few months back when it was hot but this has resolved.   His primary care is Dr. Hetty Ely. His lipids are followed and managed in primary care. 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    Past Medical History  Diagnosis Date  . Impotence     of organic nature  . Prostate cancer     s/p prostatectomy  . CAD (coronary artery disease)     PTCA 1998 LAD-s/p overlapping DES in LAD in 05/2003  . HTN (hypertension)   . Hyperlipidemia   . History of colonoscopy     Past Surgical History  Procedure Date  . Pilonidal cystectomy   . Angioplasty   . Bone spur right foot     Bilateral  . Prostatectomy   . Laminectomy   . Left shoulder decompression   . Right carpal tunnel repair     Current Outpatient Prescriptions  Medication Sig Dispense Refill  . aspirin (BAYER CHILDRENS ASPIRIN) 81 MG chewable tablet Chew 81 mg by mouth daily.        . calcium carbonate (OS-CAL) 600 MG TABS Take 600 mg by mouth daily.        . clopidogrel (PLAVIX) 75 MG tablet Take 75 mg by mouth daily.        Marland Kitchen ezetimibe-simvastatin (VYTORIN) 10-40 MG per tablet Take 1 tablet by mouth at bedtime.        . Fish Oil OIL 1 tablet by Does not apply route daily.        . metoprolol (LOPRESSOR) 50 MG tablet Take 1 tablet (50 mg total) by mouth 2 (two) times daily.  180 tablet  4  . nitroGLYCERIN (NITROSTAT) 0.4 MG SL tablet Place  0.4 mg under the tongue every 5 (five) minutes as needed.        . ramipril (ALTACE) 2.5 MG capsule TAKE 1 CAPSULE TWICE A DAY  180 capsule  1    Allergies  Allergen Reactions  . Tetracycline     REACTION: unspecified    History   Social History  . Marital Status: Married    Spouse Name: N/A    Number of Children: 3  . Years of Education: N/A   Occupational History  . 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    Social History Main Topics  . Smoking status: Not on file  . Smokeless tobacco: Not on file  . Alcohol Use: Not on file  . Drug Use: Not on file  . Sexually Active: Not on file   Other Topics Concern  . Not on file   Social History Narrative  . No narrative on file    Family History  Problem Relation Age of Onset  . Prostate cancer Father   . Colon cancer Mother   . Leukemia Mother  Review of Systems:  As stated in the HPI and otherwise negative.   BP 115/58  Pulse 51  Ht 5\' 11"  (1.803 m)  Wt 211 lb 12.8 oz (96.072 kg)  BMI 29.54 kg/m2  Physical Examination: General: Well developed, well nourished, NAD HEENT: OP clear, mucus membranes moist SKIN: warm, dry. No rashes. Neuro: No focal deficits Musculoskeletal: Muscle strength 5/5 all ext Psychiatric: Mood and affect normal Neck: No JVD, no carotid bruits, no thyromegaly, no lymphadenopathy. Lungs:Clear bilaterally, no wheezes, rhonci, crackles Cardiovascular: Regular rate and rhythm. No murmurs, gallops or rubs. Abdomen:Soft. Bowel sounds present. Non-tender.  Extremities: No lower extremity edema. Pulses are 2 + in the bilateral DP/PT.  ZOX:WRUEA bradycardia, rate 51 bpm.

## 2010-09-13 NOTE — Patient Instructions (Signed)
Your physician recommends that you schedule a follow-up appointment in: 6 MONTHS  Your physician has requested that you have an echocardiogram. Echocardiography is a painless test that uses sound waves to create images of your heart. It provides your doctor with information about the size and shape of your heart and how well your heart's chambers and valves are working. This procedure takes approximately one hour. There are no restrictions for this procedure.     

## 2010-09-17 ENCOUNTER — Encounter: Payer: Self-pay | Admitting: Cardiovascular Disease

## 2010-09-19 ENCOUNTER — Ambulatory Visit (HOSPITAL_COMMUNITY): Payer: Medicare Other | Attending: Cardiovascular Disease | Admitting: Radiology

## 2010-09-19 DIAGNOSIS — I10 Essential (primary) hypertension: Secondary | ICD-10-CM | POA: Insufficient documentation

## 2010-09-19 DIAGNOSIS — I2510 Atherosclerotic heart disease of native coronary artery without angina pectoris: Secondary | ICD-10-CM | POA: Insufficient documentation

## 2010-09-19 DIAGNOSIS — E669 Obesity, unspecified: Secondary | ICD-10-CM | POA: Insufficient documentation

## 2010-10-21 LAB — DIFFERENTIAL
Eosinophils Absolute: 0.3
Lymphs Abs: 2.1
Neutro Abs: 3.8
Neutrophils Relative %: 54

## 2010-10-21 LAB — COMPREHENSIVE METABOLIC PANEL
ALT: 33
BUN: 13
CO2: 28
Calcium: 9.5
Creatinine, Ser: 0.93
GFR calc non Af Amer: >60
Glucose, Bld: 102 — ABNORMAL HIGH
Sodium: 139

## 2010-10-21 LAB — CBC
HCT: 40.1
Hemoglobin: 13.7
MCHC: 34.2
MCV: 91.6
RBC: 4.38

## 2010-10-21 LAB — URINALYSIS, ROUTINE W REFLEX MICROSCOPIC
Bilirubin Urine: NEGATIVE
Hgb urine dipstick: NEGATIVE
Specific Gravity, Urine: 1.022
Urobilinogen, UA: 0.2

## 2010-10-21 LAB — PROTIME-INR: INR: 0.9

## 2010-10-28 LAB — BASIC METABOLIC PANEL
CO2: 30
Calcium: 9.5
Glucose, Bld: 120 — ABNORMAL HIGH
Potassium: 5.2 — ABNORMAL HIGH
Sodium: 140

## 2010-12-02 ENCOUNTER — Other Ambulatory Visit: Payer: Self-pay | Admitting: Family Medicine

## 2010-12-02 ENCOUNTER — Other Ambulatory Visit (INDEPENDENT_AMBULATORY_CARE_PROVIDER_SITE_OTHER): Payer: Medicare Other

## 2010-12-02 DIAGNOSIS — E782 Mixed hyperlipidemia: Secondary | ICD-10-CM

## 2010-12-02 DIAGNOSIS — C61 Malignant neoplasm of prostate: Secondary | ICD-10-CM

## 2010-12-02 DIAGNOSIS — I10 Essential (primary) hypertension: Secondary | ICD-10-CM

## 2010-12-02 DIAGNOSIS — R739 Hyperglycemia, unspecified: Secondary | ICD-10-CM

## 2010-12-02 DIAGNOSIS — I2510 Atherosclerotic heart disease of native coronary artery without angina pectoris: Secondary | ICD-10-CM

## 2010-12-02 DIAGNOSIS — E749 Disorder of carbohydrate metabolism, unspecified: Secondary | ICD-10-CM

## 2010-12-02 DIAGNOSIS — R7309 Other abnormal glucose: Secondary | ICD-10-CM

## 2010-12-02 DIAGNOSIS — E7800 Pure hypercholesterolemia, unspecified: Secondary | ICD-10-CM

## 2010-12-02 LAB — CBC WITH DIFFERENTIAL/PLATELET
Eosinophils Relative: 3.7 % (ref 0.0–5.0)
HCT: 40 % (ref 39.0–52.0)
Hemoglobin: 13.6 g/dL (ref 13.0–17.0)
Lymphs Abs: 2.3 10*3/uL (ref 0.7–4.0)
Monocytes Relative: 8.7 % (ref 3.0–12.0)
Neutro Abs: 2.8 10*3/uL (ref 1.4–7.7)
Platelets: 155 10*3/uL (ref 150.0–400.0)
WBC: 5.9 10*3/uL (ref 4.5–10.5)

## 2010-12-02 LAB — COMPREHENSIVE METABOLIC PANEL
AST: 22 U/L (ref 0–37)
Alkaline Phosphatase: 63 U/L (ref 39–117)
BUN: 11 mg/dL (ref 6–23)
Creatinine, Ser: 0.9 mg/dL (ref 0.4–1.5)
Total Bilirubin: 0.8 mg/dL (ref 0.3–1.2)

## 2010-12-02 LAB — PSA: PSA: 0.01 ng/mL — ABNORMAL LOW (ref 0.10–4.00)

## 2010-12-02 LAB — LIPID PANEL
HDL: 43.7 mg/dL (ref >39.00)
LDL Cholesterol: 78 mg/dL (ref 0–99)
Total CHOL/HDL Ratio: 4
Triglycerides: 185 mg/dL — ABNORMAL HIGH (ref 0.0–149.0)
VLDL: 37 mg/dL (ref 0.0–40.0)

## 2010-12-04 ENCOUNTER — Encounter: Payer: Self-pay | Admitting: Family Medicine

## 2010-12-05 ENCOUNTER — Ambulatory Visit (INDEPENDENT_AMBULATORY_CARE_PROVIDER_SITE_OTHER): Payer: Medicare Other | Admitting: Family Medicine

## 2010-12-05 ENCOUNTER — Encounter: Payer: Self-pay | Admitting: Family Medicine

## 2010-12-05 DIAGNOSIS — E782 Mixed hyperlipidemia: Secondary | ICD-10-CM

## 2010-12-05 DIAGNOSIS — I10 Essential (primary) hypertension: Secondary | ICD-10-CM

## 2010-12-05 DIAGNOSIS — Z0000 Encounter for general adult medical examination without abnormal findings: Secondary | ICD-10-CM

## 2010-12-05 DIAGNOSIS — C61 Malignant neoplasm of prostate: Secondary | ICD-10-CM

## 2010-12-05 DIAGNOSIS — D485 Neoplasm of uncertain behavior of skin: Secondary | ICD-10-CM | POA: Insufficient documentation

## 2010-12-05 DIAGNOSIS — E749 Disorder of carbohydrate metabolism, unspecified: Secondary | ICD-10-CM

## 2010-12-05 MED ORDER — RAMIPRIL 2.5 MG PO CAPS: 2.5000 mg | ORAL_CAPSULE | Freq: Two times a day (BID) | ORAL | Status: DC

## 2010-12-05 NOTE — Progress Notes (Signed)
  Subjective:    Patient ID: [name], male    DOB: 03/15/1932, 77 y.o.   MRN: [id number]  HPI Pt here for Comp Exam. He was seen by Cardiology earlier this year and was felt to be stable. He feels well and has no complaints. Has had colonoscopy, last by Dr Doreatha Martin a few years ago just before his retirement, 01/28/06. To be repeated in 5 tyears.   Review of Systems  Constitutional: Negative for fever, chills, diaphoresis, appetite change, fatigue and unexpected weight change.  HENT: Negative for hearing loss, ear pain, tinnitus and ear discharge.   Eyes: Negative for pain, discharge and visual disturbance.  Respiratory: Negative for cough, shortness of breath and wheezing.   Cardiovascular: Negative for chest pain and palpitations.       No SOB w/ exertion  Gastrointestinal: Negative for nausea, vomiting, abdominal pain, diarrhea, constipation and blood in stool.       No heartburn or swallowing problems.  Genitourinary: Positive for penile swelling. Negative for dysuria, frequency and difficulty urinating.       No nocturia  Musculoskeletal: Negative for myalgias, back pain and arthralgias.  Skin: Negative for rash.       No itching or dryness. Lesion on upper midline of back.  Neurological: Negative for tremors and numbness.       No tingling or balance problems.  Hematological: Negative for adenopathy. Bruises/bleeds easily (on Plavix.).  Psychiatric/Behavioral: Negative for dysphoric mood and agitation.       Objective:   Physical Exam  Constitutional: He is oriented to person, place, and time. He appears well-developed and well-nourished. No distress.  HENT:  Head: Normocephalic and atraumatic.  Right Ear: External ear normal.  Left Ear: External ear normal.  Nose: Nose normal.  Mouth/Throat: Oropharynx is clear and moist.  Eyes: Conjunctivae and EOM are normal. Pupils are equal, round, and reactive to light. Right eye exhibits no discharge. Left eye exhibits no discharge. No  scleral icterus.  Neck: Normal range of motion. Neck supple. No thyromegaly present.  Cardiovascular: Normal rate, regular rhythm, normal heart sounds and intact distal pulses.   No murmur heard. Pulmonary/Chest: Effort normal and breath sounds normal. No respiratory distress. He has no wheezes.  Abdominal: Soft. Bowel sounds are normal. He exhibits no distension and no mass. There is no tenderness. There is no rebound and no guarding.  Genitourinary: Penis normal.       Rectal not done.  Musculoskeletal: Normal range of motion. He exhibits no edema.  Lymphadenopathy:    He has no cervical adenopathy.  Neurological: He is alert and oriented to person, place, and time. Coordination normal.  Skin: Skin is warm and dry. No rash noted. He is not diaphoretic.  Psychiatric: He has a normal mood and affect. His behavior is normal. Judgment and thought content normal.          Assessment & Plan:  HMPE  I have personally reviewed the Medicare Annual Wellness questionnaire and have noted 1. The patient's medical and social history 2. Their use of alcohol, tobacco or illicit drugs 3. Their current medications and supplements 4. The patient's functional ability including ADL's, fall risks, home safety risks and hearing or visual             impairment. 5. Diet and physical activities 6. Evidence for depression or mood disorders  He has no signs of cognitive impairment.   Needs repeat colonoscopy next year, per Dr Corinda Gubler.

## 2010-12-05 NOTE — Patient Instructions (Signed)
RTC for skin lesion excision, 30 mins.

## 2010-12-05 NOTE — Assessment & Plan Note (Signed)
Good control. Cont curr meds. BP Readings from Last 3 Encounters:  12/05/10 122/70  09/13/10 115/58  11/21/09 118/70

## 2010-12-05 NOTE — Progress Notes (Signed)
  Subjective:    Patient ID: [name], male    DOB: November 18, 1932, 77 y.o.   MRN: [id number]  HPI    Review of Systems     Objective:   Physical ExamSkin lesion on the right mid back with indistinct border, irregular, dimpled and 1cm diam.        Assessment & Plan:

## 2010-12-05 NOTE — Assessment & Plan Note (Signed)
S/P prostatectomy. PSA stable at 0.1.

## 2010-12-05 NOTE — Assessment & Plan Note (Signed)
Stable. Has done good job with avoiding progression.

## 2010-12-05 NOTE — Assessment & Plan Note (Signed)
Nos ok except Trigs slightly high. Discussed avoiding sweets and carbs. Lab Results  Component Value Date   CHOL 159 12/02/2010   HDL 43.70 12/02/2010   LDLCALC 78 12/02/2010   TRIG 185.0* 12/02/2010   CHOLHDL 4 12/02/2010

## 2010-12-05 NOTE — Assessment & Plan Note (Signed)
Will have pt come back for excision and path.

## 2010-12-12 ENCOUNTER — Ambulatory Visit (INDEPENDENT_AMBULATORY_CARE_PROVIDER_SITE_OTHER): Payer: Medicare Other | Admitting: Family Medicine

## 2010-12-12 ENCOUNTER — Encounter: Payer: Self-pay | Admitting: Family Medicine

## 2010-12-12 VITALS — BP 116/60 | HR 60 | Temp 97.5°F | Wt 213.0 lb

## 2010-12-12 DIAGNOSIS — D485 Neoplasm of uncertain behavior of skin: Secondary | ICD-10-CM

## 2010-12-12 DIAGNOSIS — L723 Sebaceous cyst: Secondary | ICD-10-CM

## 2010-12-12 NOTE — Progress Notes (Signed)
  Subjective:    Patient ID: DELVECCHIO MADOLE, male    DOB: [birth year], 77 y.o.   MRN: [id number]  HPI Pt here for removal of suspicious lesion from the right mid back which has been growing. It is the site of a previous lesion removal. He also has an enlarging sebaceous cyst of the lower neck/upper back which is now extremely firm.    Review of Systems     Objective:   Physical Exam Both lesions prepped and draped in the usual sterile manner, done individually with field broken between procedures. Both areas anesthetized with 2% Lidocaine w/ Epi. Sebaceuos cyst excised in elliptical fashion and closed with three 3-0 nylon sutures in simple interrupted fashion. Suspicious lesion of midback excised with shave technique with dessication and curretage x 3 afterward. Sterile dressings applied to each. The sebaceous cyst continued to slowly bleed due to Plavix on board. Pt tolerated procedure well.        Assessment & Plan:

## 2010-12-12 NOTE — Patient Instructions (Signed)
RTC one week for suture removal.

## 2010-12-12 NOTE — Assessment & Plan Note (Signed)
Shave biopsy with dessication and curretage x 3 done. Seb cyst removed via total excision.

## 2010-12-18 ENCOUNTER — Encounter: Payer: Self-pay | Admitting: Family Medicine

## 2010-12-18 ENCOUNTER — Ambulatory Visit (INDEPENDENT_AMBULATORY_CARE_PROVIDER_SITE_OTHER): Payer: Medicare Other | Admitting: Family Medicine

## 2010-12-18 DIAGNOSIS — C44519 Basal cell carcinoma of skin of other part of trunk: Secondary | ICD-10-CM | POA: Insufficient documentation

## 2010-12-18 NOTE — Progress Notes (Signed)
  Subjective:    Patient ID: [name], male    DOB: 1932-01-28, 77 y.o.   MRN: [id number]  HPI Pt here for suture removal from sebaceous cystectomy and discussion of findings from 238.2 excision. He feels well and has no complaints. His sites are healing fine.    Review of SystemsNoncontributory except as above.       Objective:   Physical ExamWDWN WM NAD Sutures inplace from cystectomy. Shave biopsy site healing well. Has been itching him.        Assessment & Plan:

## 2010-12-18 NOTE — Assessment & Plan Note (Signed)
BCC by path treated with shave biopsy and dessication and currettage. Will see him back in one month to retreat via D&C again. Sebaceous cystectomy sutures removed and steri strips placed. Keep clean and dry for 24hrs.

## 2010-12-18 NOTE — Patient Instructions (Signed)
RTC 12/27 PM for retreatment of Basal Cell site of back

## 2010-12-20 NOTE — Progress Notes (Addendum)
  Subjective:    Patient ID: [name], male    DOB: [birth year], 77 y.o.   MRN: [id number]  HPI    Review of Systems     Objective:   Physical Exam        Assessment & Plan:  Sebaceuos Cyst on posterior neck approx 2cm in diam. Suspicious lesion of back approx 2.5 cm with irregular borders.

## 2011-01-07 ENCOUNTER — Encounter: Payer: Self-pay | Admitting: Gastroenterology

## 2011-01-23 ENCOUNTER — Ambulatory Visit (INDEPENDENT_AMBULATORY_CARE_PROVIDER_SITE_OTHER): Payer: Medicare Other | Admitting: Family Medicine

## 2011-01-23 ENCOUNTER — Encounter: Payer: Self-pay | Admitting: Family Medicine

## 2011-01-23 DIAGNOSIS — C44519 Basal cell carcinoma of skin of other part of trunk: Secondary | ICD-10-CM

## 2011-01-23 DIAGNOSIS — R197 Diarrhea, unspecified: Secondary | ICD-10-CM

## 2011-01-23 MED ORDER — DIPHENOXYLATE-ATROPINE 2.5-0.025 MG PO TABS: ORAL_TABLET | ORAL | Status: DC

## 2011-01-23 NOTE — Assessment & Plan Note (Signed)
Treated again. He will start seeing Dr Terri Piedra (his wife's dermatologist) in the near future for a suspicious lesion on the pinna of the right ear.

## 2011-01-23 NOTE — Patient Instructions (Addendum)
See Dr Terri Piedra for recheck. See Dr Reece Agar for future care. Script for Lomotil given.

## 2011-01-23 NOTE — Progress Notes (Signed)
  Subjective:    Patient ID: [name], male    DOB: 01-Aug-1932, 77 y.o.   MRN: [id number]  HPI Pt here for retreatment of superficial BCC of the right upper back. Treated with shave biopsy and dessication and currettage x 3 one month ago. Due to poor margins on path, wanted to retreat to insure comprehensive trmt. He has tolerated this well and has small scab in residual from fairly deep trmt last time. He has had significant diarrhea within the last week and uses Lomotil when he can't get things under control and requests a refill. The initial prescription of 20 pills has lasted 7 years! He understands to respect its use.    Review of SystemsNoncontributory except as above.       Objective:   Physical ExamWDWN WM NAD  Small well healing 5x82mm scab overlying area of trmt from last time. Area prepped in ususal sterile manner. Lido 1%, 5ccs used for local. Dessication and currettage x3 done again. Sterile dressing placed. Clean and dry until tonite.        Assessment & Plan:

## 2011-01-23 NOTE — Assessment & Plan Note (Signed)
Script given for Lomotil when needed, diarrheal episode currently resolved.

## 2011-03-13 ENCOUNTER — Ambulatory Visit (INDEPENDENT_AMBULATORY_CARE_PROVIDER_SITE_OTHER): Payer: Medicare Other | Admitting: Cardiovascular Disease

## 2011-03-13 ENCOUNTER — Encounter: Payer: Self-pay | Admitting: Cardiovascular Disease

## 2011-03-13 VITALS — BP 148/73 | HR 75 | Ht 71.0 in | Wt 211.0 lb

## 2011-03-13 DIAGNOSIS — I2510 Atherosclerotic heart disease of native coronary artery without angina pectoris: Secondary | ICD-10-CM

## 2011-03-13 DIAGNOSIS — I10 Essential (primary) hypertension: Secondary | ICD-10-CM

## 2011-03-13 MED ORDER — CLOPIDOGREL BISULFATE 75 MG PO TABS: 75.0000 mg | ORAL_TABLET | Freq: Every day | ORAL | Status: DC

## 2011-03-13 MED ORDER — RAMIPRIL 2.5 MG PO CAPS: 2.5000 mg | ORAL_CAPSULE | Freq: Two times a day (BID) | ORAL | Status: DC

## 2011-03-13 MED ORDER — EZETIMIBE-SIMVASTATIN 10-40 MG PO TABS: 1.0000 | ORAL_TABLET | Freq: Every day | ORAL | Status: DC

## 2011-03-13 MED ORDER — METOPROLOL TARTRATE 50 MG PO TABS: 50.0000 mg | ORAL_TABLET | Freq: Two times a day (BID) | ORAL | Status: DC

## 2011-03-13 NOTE — Patient Instructions (Addendum)
Your physician wants you to follow-up in: 6 months  You will receive a reminder letter in the mail two months in advance. If you don't receive a letter, please call our office to schedule the follow-up appointment.  Your physician recommends that you continue on your current medications as directed. Please refer to the Current Medication list given to you today.  

## 2011-03-13 NOTE — Progress Notes (Signed)
History of Present Illness: 78 yo WM with h/o CAD, HTN, HLD here today for cardiac follow up. He has been followed in the past by Dr. Juanda Chance. In 1998 he had PTCA of the LAD. In 2005 he had a drug-eluting stent to the LAD and then a short time later had a second drug-eluting stent to the LAD overlapping the first for what I believe was an edge tear. I last saw him in August 2012.   He is here today for follow up. He has had no recent chest pain, shortness of breath or palpitations. No dizziness, near syncope or syncope. He has been very active. He has been out of Ramipril for one week.   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   Primary Care Physician: Was Dr. Hetty Ely. He will establish with [facility]ivens soon. His lipids are followed and managed in primary care.  Last Lipid Profile: November 2012  Total chol: 159  HDL: 44   LDL: 78  Past Medical History  Diagnosis Date  . Impotence     of organic nature  . Prostate cancer     s/p prostatectomy  . CAD (coronary artery disease)     PTCA 1998 LAD-s/p overlapping DES in LAD in 05/2003  . HTN (hypertension)   . Hyperlipidemia   . History of colonoscopy   . Elevated glucose   . Spinal disease     lumbosacral  . Chest pain 5/5-5/11/2003    Hospital MI, CAD, INCR, CHOL, HTN    Past Surgical History  Procedure Date  . Pilonidal cystectomy late 42s  . Angioplasty 1992  . Bone spur right foot     Bilateral  . Prostatectomy   . Laminectomy   . Left shoulder decompression   . Right carpal tunnel repair 11/18/2007    Dr. Teressa Senter  . Coronary angioplasty 06/02/2003    PTCA, occl of vessel w/good collaterals    Current Outpatient Prescriptions  Medication Sig Dispense Refill  . aspirin (BAYER CHILDRENS ASPIRIN) 81 MG chewable tablet Chew 81 mg by mouth daily.        . calcium carbonate (OS-CAL) 600 MG TABS Take 600 mg by mouth daily.        . clopidogrel (PLAVIX) 75  MG tablet Take 1 tablet (75 mg total) by mouth daily.  90 tablet  3  . diphenoxylate-atropine (LOMOTIL) 2.5-0.025 MG per tablet Take one tablet a day as needed.  20 tablet  0  . ezetimibe-simvastatin (VYTORIN) 10-40 MG per tablet Take 1 tablet by mouth at bedtime.        . Fish Oil OIL 1 tablet by Does not apply route daily.        . metoprolol (LOPRESSOR) 50 MG tablet Take 1 tablet (50 mg total) by mouth 2 (two) times daily.  180 tablet  4  . nitroGLYCERIN (NITROSTAT) 0.4 MG SL tablet Place 1 tablet (0.4 mg total) under the tongue every 5 (five) minutes as needed.  25 tablet  3  . ramipril (ALTACE) 2.5 MG capsule Take 1 capsule (2.5 mg total) by mouth 2 (two) times daily.  180 capsule  3    Allergies  Allergen Reactions  . Tetracycline     REACTION: unspecified    History   Social History  . Marital Status: Married    Spouse Name: N/A    Number of Children: 3  . Years  of Education: N/A   Occupational History  . 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    Social History Main Topics  . Smoking status: Never Smoker   . Smokeless tobacco: Current User    Types: Chew  . Alcohol Use: 0.5 oz/week    1 drink(s) per week     occassionally  . Drug Use: No  . Sexually Active: No   Other Topics Concern  . 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    Family History  Problem Relation Age of Onset  . Prostate cancer Father   . Cancer Father     prostate  . Colon cancer Mother   . Leukemia Mother   . Cancer Mother     colon, leukemia    Review of Systems:  As stated in the HPI and otherwise negative.   BP 148/73  Pulse 75  Ht 5\' 11"  (1.803 m)  Wt 211 lb (95.709 kg)  BMI 29.43 kg/m2  Physical Examination: General: Well developed, well nourished, NAD HEENT: OP clear, mucus membranes moist SKIN: warm,  dry. No rashes. Neuro: No focal deficits Musculoskeletal: Muscle strength 5/5 all ext Psychiatric: Mood and affect normal Neck: No JVD, no carotid bruits, no thyromegaly, no lymphadenopathy. Lungs:Clear bilaterally, no wheezes, rhonci, crackles Cardiovascular: Regular rate and rhythm. No murmurs, gallops or rubs. Abdomen:Soft. Bowel sounds present. Non-tender.  Extremities: No lower extremity edema. Pulses are 2 + in the bilateral DP/PT.

## 2011-03-13 NOTE — Assessment & Plan Note (Signed)
Slightly elevated today but he has been out of his Ramipril. Will restart.

## 2011-03-13 NOTE — Assessment & Plan Note (Signed)
Stable. Continue current meds. Lipids at goal. BP up today but he has been out of Ramipril.

## 2011-07-16 ENCOUNTER — Ambulatory Visit (INDEPENDENT_AMBULATORY_CARE_PROVIDER_SITE_OTHER): Payer: Medicare Other | Admitting: Family Medicine

## 2011-07-16 ENCOUNTER — Encounter: Payer: Self-pay | Admitting: Family Medicine

## 2011-07-16 VITALS — BP 158/72 | HR 68 | Temp 98.1°F | Wt 212.0 lb

## 2011-07-16 DIAGNOSIS — L0291 Cutaneous abscess, unspecified: Secondary | ICD-10-CM | POA: Insufficient documentation

## 2011-07-16 DIAGNOSIS — L0390 Cellulitis, unspecified: Secondary | ICD-10-CM

## 2011-07-16 MED ORDER — SULFAMETHOXAZOLE-TRIMETHOPRIM 800-160 MG PO TABS: 1.0000 | ORAL_TABLET | Freq: Two times a day (BID) | ORAL | Status: DC

## 2011-07-16 NOTE — Progress Notes (Signed)
Subjective:    Patient ID: [name], male    DOB: [birth year], 78 y.o.   MRN: [id number]  HPI  78 yo male new to me here for bump on his left chest.  Noticed it a few days ago.  Becoming more red and painful.   No fevers, chills, nausea or vomiting. Has not applied warm compresses.  He is concerned b/c he has a prior h/o BCC.  Never hand anything like this before.  No drainage from his nipple.  Allergic to tetracycline. Patient Active Problem List  Diagnosis  . NEOP, MALIGNANT, PROSTATE  . DISORDER, CARBOHYDRATE METABOLISM NOS  . HYPERLIPIDEMIA, MIXED  . HYPERTENSION, BENIGN ESSENTIAL  . ERECTILE DYSFUNCTION, ORGANIC  . DEGENERATIVE DISC DISEASE, LUMBAR SPINE  . TENDINITIS, CALCIFIC, SHOULDER, LEFT  . CAD (coronary artery disease)  . BCC (basal cell carcinoma), back  . Abscess   Past Medical History  Diagnosis Date  . Impotence     of organic nature  . Prostate cancer     s/p prostatectomy  . CAD (coronary artery disease)     PTCA 1998 LAD-s/p overlapping DES in LAD in 05/2003  . HTN (hypertension)   . Hyperlipidemia   . History of colonoscopy   . Elevated glucose   . Spinal disease     lumbosacral  . Chest pain 5/5-5/11/2003    Hospital MI, CAD, INCR, CHOL, HTN   Past Surgical History  Procedure Date  . Pilonidal cystectomy late 16s  . Angioplasty 1992  . Bone spur right foot     Bilateral  . Prostatectomy   . Laminectomy   . Left shoulder decompression   . Right carpal tunnel repair 11/18/2007    Dr. Teressa Senter  . Coronary angioplasty 06/02/2003    PTCA, occl of vessel w/good collaterals   History  Substance Use Topics  . Smoking status: Never Smoker   . Smokeless tobacco: Current User    Types: Chew  . Alcohol Use: 0.5 oz/week    1 drink(s) per week     occassionally   Family History  Problem Relation Age of Onset  . Prostate cancer Father   . Cancer Father     prostate  . Colon cancer Mother   . Leukemia Mother   . Cancer Mother    colon, leukemia   Allergies  Allergen Reactions  . Tetracycline     REACTION: unspecified   Current Outpatient Prescriptions on File Prior to Visit  Medication Sig Dispense Refill  . aspirin (BAYER CHILDRENS ASPIRIN) 81 MG chewable tablet Chew 81 mg by mouth daily.        . calcium carbonate (OS-CAL) 600 MG TABS Take 600 mg by mouth daily.        . clopidogrel (PLAVIX) 75 MG tablet Take 1 tablet (75 mg total) by mouth daily.  90 tablet  3  . diphenoxylate-atropine (LOMOTIL) 2.5-0.025 MG per tablet Take one tablet a day as needed.  20 tablet  0  . ezetimibe-simvastatin (VYTORIN) 10-40 MG per tablet Take 1 tablet by mouth at bedtime.  90 tablet  3  . Fish Oil OIL 1 tablet by Does not apply route daily.        . metoprolol (LOPRESSOR) 50 MG tablet Take 1 tablet (50 mg total) by mouth 2 (two) times daily.  180 tablet  3  . nitroGLYCERIN (NITROSTAT) 0.4 MG SL tablet Place 1 tablet (0.4 mg total) under the tongue every 5 (five) minutes as needed.  25 tablet  3  . ramipril (ALTACE) 2.5 MG capsule Take 1 capsule (2.5 mg total) by mouth 2 (two) times daily.  180 capsule  3   The PMH, PSH, Social History, Family History, Medications, and allergies have been reviewed in [facility], and have been updated if relevant.   Review of Systems See HPI    Objective:   Physical Exam BP 158/72  Pulse 68  Temp 98.1 F (36.7 C) (Oral)  Wt 212 lb (96.163 kg) General:  Pleasant male, NAD, non toxic appearing. Pulses:  R and L posterior tibial pulses are full and equal bilaterally  Skin:  2 cm, firm, non indurated raised abscess on left breast at 3 oclock, no discharge from nipple, no surrounding erythema, mildly TTP     Assessment & Plan:   1. Abscess    New- non induration. Will treat with Bactrim DS - 1 tab twice daily x 10 days and warm compresses. If no improvement or if symptoms deteriorate, will refer to surgery for I and D given location. The patient indicates understanding of these issues and  agrees with the plan.

## 2011-07-16 NOTE — Patient Instructions (Addendum)
You have an abscess. Please apply warm compresses as many times a day as possible. Take hot showers. Take antibiotic as directed- 1 tablet twice a day for 10 days. Call me immediately if symptoms are worsening or if you develop a fever, chills, or nausea. If it does not improve, we will cut it open and drain it.

## 2011-07-27 ENCOUNTER — Emergency Department (HOSPITAL_COMMUNITY)
Admission: EM | Admit: 2011-07-27 | Discharge: 2011-07-27 | Disposition: A | Discharge status: Home / Self Care | Payer: Medicare Other | Attending: Emergency Medicine | Admitting: Emergency Medicine

## 2011-07-27 ENCOUNTER — Encounter (HOSPITAL_COMMUNITY): Payer: Self-pay | Admitting: Emergency Medicine

## 2011-07-27 ENCOUNTER — Emergency Department (HOSPITAL_COMMUNITY): Payer: Medicare Other

## 2011-07-27 DIAGNOSIS — Z7982 Long term (current) use of aspirin: Secondary | ICD-10-CM | POA: Insufficient documentation

## 2011-07-27 DIAGNOSIS — I10 Essential (primary) hypertension: Secondary | ICD-10-CM | POA: Insufficient documentation

## 2011-07-27 DIAGNOSIS — Z79899 Other long term (current) drug therapy: Secondary | ICD-10-CM | POA: Insufficient documentation

## 2011-07-27 DIAGNOSIS — M549 Dorsalgia, unspecified: Secondary | ICD-10-CM | POA: Insufficient documentation

## 2011-07-27 DIAGNOSIS — I2510 Atherosclerotic heart disease of native coronary artery without angina pectoris: Secondary | ICD-10-CM | POA: Insufficient documentation

## 2011-07-27 MED ORDER — MORPHINE SULFATE 2 MG/ML IJ SOLN
INTRAMUSCULAR | Status: AC
  Filled 2011-07-27: qty 1

## 2011-07-27 MED ORDER — DIAZEPAM 5 MG PO TABS: 5.0000 mg | ORAL_TABLET | Freq: Two times a day (BID) | ORAL | Status: DC

## 2011-07-27 MED ORDER — DIAZEPAM 5 MG PO TABS
5.0000 mg | ORAL_TABLET | Freq: Once | ORAL | Status: AC
  Administered 2011-07-27: 5 mg via ORAL
  Filled 2011-07-27: qty 1

## 2011-07-27 MED ORDER — HYDROCODONE-ACETAMINOPHEN 5-325 MG PO TABS: 1.0000 | ORAL_TABLET | ORAL | Status: DC | PRN

## 2011-07-27 MED ORDER — MORPHINE SULFATE 4 MG/ML IJ SOLN
6.0000 mg | Freq: Once | INTRAMUSCULAR | Status: AC
  Administered 2011-07-27: 6 mg via INTRAMUSCULAR
  Filled 2011-07-27: qty 1

## 2011-07-27 NOTE — ED Provider Notes (Signed)
Medical screening examination/treatment/procedure(s) were performed by non-physician practitioner and as supervising physician I was immediately available for consultation/collaboration.  Ethelda Chick, MD 07/27/11 [phone]

## 2011-07-27 NOTE — ED Provider Notes (Signed)
History     CSN: [id number]  Arrival date & time 07/27/11  2045   First MD Initiated Contact with Patient 07/27/11 2212      Chief Complaint  Patient presents with  . Back Pain    (Consider location/radiation/quality/duration/timing/severity/associated sxs/prior treatment) Patient is a 78 y.o. male presenting with back pain. The history is provided by the patient and the spouse.  Back Pain  The current episode started yesterday. The problem occurs constantly. 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  The pain is present in the lumbar spine. The pain does not radiate. The pain is at a severity of 10/10. Exacerbated by: He has no pain when at rest, but significant pain with any movement. Pertinent negatives include no fever, no numbness, no abdominal pain, no bowel incontinence, no bladder incontinence, no leg pain, no paresthesias and no weakness.    Past Medical History  Diagnosis Date  . Impotence     of organic nature  . Prostate cancer     s/p prostatectomy  . CAD (coronary artery disease)     PTCA 1998 LAD-s/p overlapping DES in LAD in 05/2003  . HTN (hypertension)   . Hyperlipidemia   . History of colonoscopy   . Elevated glucose   . Spinal disease     lumbosacral  . Chest pain 5/5-5/11/2003    Hospital MI, CAD, INCR, CHOL, HTN    Past Surgical History  Procedure Date  . Pilonidal cystectomy late 89s  . Angioplasty 1992  . Bone spur right foot     Bilateral  . Prostatectomy   . Laminectomy   . Left shoulder decompression   . Right carpal tunnel repair 11/18/2007    Dr. Teressa Senter  . Coronary angioplasty 06/02/2003    PTCA, occl of vessel w/good collaterals    Family History  Problem Relation Age of Onset  . Prostate cancer Father   . Cancer Father     prostate  . Colon cancer Mother   . Leukemia Mother   . Cancer Mother     colon, leukemia      History  Substance Use Topics  . Smoking status: Never Smoker   . Smokeless tobacco: Current User    Types: Chew  . Alcohol Use: 0.5 oz/week    1 drink(s) per week     occassionally      Review of Systems  Constitutional: Negative for fever.  Gastrointestinal: Negative for abdominal pain and bowel incontinence.  Genitourinary: Negative for bladder incontinence and difficulty urinating.  Musculoskeletal: Positive for back pain.  Neurological: Negative for weakness, numbness and paresthesias.    Allergies  Tetracycline  Home Medications   Current Outpatient Rx  Name Route Sig Dispense Refill  . ASPIRIN 81 MG PO CHEW Oral Chew 81 mg by mouth daily.      Marland Kitchen CALCIUM CARBONATE 600 MG PO TABS Oral Take 600 mg by mouth daily.      Marland Kitchen CLOPIDOGREL BISULFATE 75 MG PO TABS Oral Take 1 tablet (75 mg total) by mouth daily. 90 tablet 3  . EZETIMIBE-SIMVASTATIN 10-40 MG PO TABS Oral Take 1 tablet by mouth at bedtime. 90 tablet 3  . FISH OIL OIL Does not apply 1 tablet by Does not apply route daily.      Marland Kitchen METOPROLOL TARTRATE 50 MG PO TABS Oral Take 1 tablet (50 mg total) by mouth  2 (two) times daily. 180 tablet 3  . NITROGLYCERIN 0.4 MG SL SUBL Sublingual Place 0.4 mg under the tongue every 5 (five) minutes as needed. For chest pain    . RAMIPRIL 2.5 MG PO CAPS Oral Take 1 capsule (2.5 mg total) by mouth 2 (two) times daily. 180 capsule 3    BP 132/55  Pulse 76  Temp 98.8 F (37.1 C) (Oral)  Resp 19  SpO2 95%  Physical Exam  Constitutional: He is oriented to person, place, and time. He appears well-developed and well-nourished.  Neck: Normal range of motion.  Pulmonary/Chest: Effort normal.  Abdominal: Soft. He exhibits no mass. There is no tenderness.  Musculoskeletal: Normal range of motion.       Right paralumbar tenderness without swelling, discoloration. No sciatic tenderness on right. Distal pulses 2+.  Neurological: He is alert and oriented to person, place, and time. He  has normal reflexes. No sensory deficit.  Skin: Skin is warm and dry.  Psychiatric: He has a normal mood and affect.    ED Course  Procedures (including critical care time)  Labs Reviewed - No data to display No results found.  Dg Lumbar Spine Complete  07/27/2011  *RADIOLOGY REPORT*  Clinical Data: Lower back pain  LUMBAR SPINE - COMPLETE 4+ VIEW  Comparison: 03/18/2007 MRI  Findings: L5 S1 degenerative disc disease.  Multilevel facet arthropathy.  Mild multilevel degenerative change and anterior osteophyte formation.  T11-12 degenerative disc disease with height loss.  No acute fracture or dislocation identified.  No aggressive osseous lesion.  Aortic atherosclerosis.  Overlying soft tissues otherwise within normal range.  Bowel gas overlies the sacrum.  SI joints grossly intact.  IMPRESSION: Multilevel degenerative changes.  No acute osseous abnormality identified of the lumbar spine.  Original Report Authenticated By: Waneta Martins, M.D.   No diagnosis found. 1. Low back pain   MDM  Better with pain medication. Able to ambulate without difficulty. Negative x-rays. Feel symptoms, exam and improvement support muscular diagnosis. Will discharge home and refer to primary care.      [employer]  Rodena Medin, PA-C 07/27/11 2333

## 2011-07-27 NOTE — Discharge Instructions (Signed)
FOLLOW UP WITH YOUR DOCTOR FOR RECHECK IN 1-2 DAYS. TAKE MEDICATIONS AS PRESCRIBED. REST THE BACK, UP TO BATHROOM ONLY. RETURN HERE WITH ANY NEW OR CONCERNING SYMPTOMS - LOSS OF BOWEL OR BLADDER CONTROL, WEAKNESS INTO THE EXTREMITIES, NUMBNESS.  Back Pain, Adult Low back pain is very common. About 1 in 5 people have back pain.The cause of low back pain is rarely dangerous. The pain often gets better over time.About half of people with a sudden onset of back pain feel better in just 2 weeks. About 8 in 10 people feel better by 6 weeks.  CAUSES Some common causes of back pain include:  Strain of the muscles or ligaments supporting the spine.   Wear and tear (degeneration) of the spinal discs.   Arthritis.   Direct injury to the back.  DIAGNOSIS Most of the time, the direct cause of low back pain is not known.However, back pain can be treated effectively even when the exact cause of the pain is unknown.Answering your caregiver's questions about your overall health and symptoms is one of the most accurate ways to make sure the cause of your pain is not dangerous. If your caregiver needs more information, he or she may order lab work or imaging tests (X-rays or MRIs).However, even if imaging tests show changes in your back, this usually does not require surgery. HOME CARE INSTRUCTIONS For many people, back pain returns.Since low back pain is rarely dangerous, it is often a condition that people can learn to [facility] their own.   Remain active. It is stressful on the back to sit or stand in one place. Do not sit, drive, or stand in one place for more than 30 minutes at a time. Take short walks on level surfaces as soon as pain allows.Try to increase the length of time you walk each day.   Do not stay in bed.Resting more than 1 or 2 days can delay your recovery.   Do not avoid exercise or work.Your body is made to move.It is not dangerous to be active, even though your back may hurt.Your  back will likely heal faster if you return to being active before your pain is gone.   Pay attention to your body when you bend and lift. Many people have less discomfortwhen lifting if they bend their knees, keep the load close to their bodies,and avoid twisting. Often, the most comfortable positions are those that put less stress on your recovering back.   Find a comfortable position to sleep. Use a firm mattress and lie on your side with your knees slightly bent. If you lie on your back, put a pillow under your knees.   Only take over-the-counter or prescription medicines as directed by your caregiver. Over-the-counter medicines to reduce pain and inflammation are often the most helpful.Your caregiver may prescribe muscle relaxant drugs.These medicines help dull your pain so you can more quickly return to your normal activities and healthy exercise.   Put ice on the injured area.   Put ice in a plastic bag.   Place a towel between your skin and the bag.   Leave the ice on for 15 to 20 minutes, 3 to 4 times a day for the first 2 to 3 days. After that, ice and heat may be alternated to reduce pain and spasms.   Ask your caregiver about trying back exercises and gentle massage. This may be of some benefit.   Avoid feeling anxious or stressed.Stress increases muscle tension and can worsen back  pain.It is important to recognize when you are anxious or stressed and learn ways to manage it.Exercise is a great option.  SEEK MEDICAL CARE IF:  You have pain that is not relieved with rest or medicine.   You have pain that does not improve in 1 week.   You have new symptoms.   You are generally not feeling well.  SEEK IMMEDIATE MEDICAL CARE IF:   You have pain that radiates from your back into your legs.   You develop new bowel or bladder control problems.   You have unusual weakness or numbness in your arms or legs.   You develop nausea or vomiting.   You develop abdominal pain.    You feel faint.  Document Released: 01/13/2005 Document Revised: 01/02/2011 Document Reviewed: 06/03/2010 [facility] Patient Information 2012 [place], [place].

## 2011-07-27 NOTE — ED Notes (Signed)
C/o lower back pain that started last night and worse today.  Reports lifting fertilizer bags yesterday.  No other known injury.

## 2011-07-30 ENCOUNTER — Encounter: Payer: Self-pay | Admitting: Family Medicine

## 2011-07-30 ENCOUNTER — Telehealth: Payer: Self-pay | Admitting: Family Medicine

## 2011-07-30 ENCOUNTER — Ambulatory Visit (INDEPENDENT_AMBULATORY_CARE_PROVIDER_SITE_OTHER): Payer: Medicare Other | Admitting: Family Medicine

## 2011-07-30 VITALS — BP 140/58 | HR 72 | Temp 99.3°F | Wt 213.0 lb

## 2011-07-30 DIAGNOSIS — M545 Low back pain, unspecified: Secondary | ICD-10-CM | POA: Insufficient documentation

## 2011-07-30 DIAGNOSIS — M549 Dorsalgia, unspecified: Secondary | ICD-10-CM

## 2011-07-30 MED ORDER — DIAZEPAM 5 MG PO TABS: 5.0000 mg | ORAL_TABLET | Freq: Two times a day (BID) | ORAL | Status: AC

## 2011-07-30 MED ORDER — HYDROCODONE-ACETAMINOPHEN 5-325 MG PO TABS: 1.0000 | ORAL_TABLET | ORAL | Status: AC | PRN

## 2011-07-30 MED ORDER — CYCLOBENZAPRINE HCL 5 MG PO TABS: 5.0000 mg | ORAL_TABLET | Freq: Three times a day (TID) | ORAL | Status: AC | PRN

## 2011-07-30 NOTE — Telephone Encounter (Signed)
Caller: Gladys/Spouse; PCP: Ruthe Mannan ([facility]est[facility] Ramp); CB#: [phone];GNFA regarding ED visit 07/27/11  Follow Up for Back Pain.;  Pain persists and was instructed to see PCP if not improved.  See in 24 hours per Back Sx protocol and nursing judgment and instructions from ED.  Caller states patient is stubborn and will only see Dr. Dayton Martes or Dr. Cheree Ditto.  Their schedules are both full.  Note to office for possible work-in appointment with one of these providers.

## 2011-07-30 NOTE — Telephone Encounter (Signed)
Please put in him 12 noon slot but ask him to come in earlier if possible.

## 2011-07-30 NOTE — Patient Instructions (Addendum)
You have musculoskeletal low back pain Take tylenol for baseline pain relief (1-2 extra strength tabs 3x/day) Flexeril as needed for muscle spasms (no driving on this medicine). Stay as active as possible. Do home exercises and stretches as directed - hold each for 20-30 seconds and do each one three times. Physical therapy has been shown to be helpful while the others have mixed results. Strengthening of low back muscles, abdominal musculature are key for long term pain relief. If not improving, will consider further imaging (MRI). Please do not take Valium with the flexeril--they both make you sleepy.

## 2011-07-30 NOTE — Progress Notes (Signed)
78 yo here for ER follow up. Seen at [facility] on 6/30 for back pain. Notes reviewed.  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    he pain does not radiate. The pain is at a severity of 10/10. Exacerbated by: He has no pain when at rest, but significant pain with any movement. Pertinent negatives include no fever, no numbness, no abdominal pain, no bowel incontinence, no bladder incontinence, no leg pain, no paresthesias and no weakness.   Lumbar xray was negative for any acute changes, showed signs of chronic degenerative changes.  Given morphine and diazepam in ER and symptoms improved.  Here today because pain has worsened and advised to follow up with PCP if pain persisted.  Pain has improved but still hurts. Taking hydrocodone and valium as needed. Continues to have no radiculopathy or LE weakness.   Patient Active Problem List  Diagnosis  . NEOP, MALIGNANT, PROSTATE  . DISORDER, CARBOHYDRATE METABOLISM NOS  . HYPERLIPIDEMIA, MIXED  . HYPERTENSION, BENIGN ESSENTIAL  . ERECTILE DYSFUNCTION, ORGANIC  . DEGENERATIVE DISC DISEASE, LUMBAR SPINE  . TENDINITIS, CALCIFIC, SHOULDER, LEFT  . CAD (coronary artery disease)  . BCC (basal cell carcinoma), back  . Abscess  . Back pain   Past Medical History  Diagnosis Date  . Impotence     of organic nature  . Prostate cancer     s/p prostatectomy  . CAD (coronary artery disease)     PTCA 1998 LAD-s/p overlapping DES in LAD in 05/2003  . HTN (hypertension)   . Hyperlipidemia   . History of colonoscopy   . Elevated glucose   . Spinal disease     lumbosacral  . Chest pain 5/5-5/11/2003    Hospital MI, CAD, INCR, CHOL, HTN   Past Surgical History  Procedure Date  . Pilonidal cystectomy late 36s  . Angioplasty 1992  . Bone spur right foot     Bilateral  . Prostatectomy   . Laminectomy   . Left shoulder decompression   . Right carpal tunnel repair 11/18/2007    Dr. Teressa Senter  .  Coronary angioplasty 06/02/2003    PTCA, occl of vessel w/good collaterals   History  Substance Use Topics  . Smoking status: Never Smoker   . Smokeless tobacco: Current User    Types: Chew  . Alcohol Use: 0.5 oz/week    1 drink(s) per week     occassionally   Family History  Problem Relation Age of Onset  . Prostate cancer Father   . Cancer Father     prostate  . Colon cancer Mother   . Leukemia Mother   . Cancer Mother     colon, leukemia   Allergies  Allergen Reactions  . Tetracycline     REACTION: unspecified   Current Outpatient Prescriptions on File Prior to Visit  Medication Sig Dispense Refill  . aspirin (BAYER CHILDRENS ASPIRIN) 81 MG chewable tablet Chew 81 mg by mouth daily.        . calcium carbonate (OS-CAL) 600 MG TABS Take 600 mg by mouth daily.        . clopidogrel (PLAVIX) 75 MG tablet Take 1 tablet (75 mg total) by mouth daily.  90 tablet  3  . diazepam (VALIUM) 5 MG tablet Take 1 tablet (5 mg total) by mouth 2 (two) times daily.  10 tablet  0  . ezetimibe-simvastatin (VYTORIN) 10-40 MG per tablet Take 1 tablet by mouth at  bedtime.  90 tablet  3  . Fish Oil OIL 1 tablet by Does not apply route daily.        Marland Kitchen HYDROcodone-acetaminophen (NORCO) 5-325 MG per tablet Take 1 tablet by mouth every 4 (four) hours as needed for pain.  12 tablet  0  . metoprolol (LOPRESSOR) 50 MG tablet Take 1 tablet (50 mg total) by mouth 2 (two) times daily.  180 tablet  3  . nitroGLYCERIN (NITROSTAT) 0.4 MG SL tablet Place 0.4 mg under the tongue every 5 (five) minutes as needed. For chest pain      . ramipril (ALTACE) 2.5 MG capsule Take 1 capsule (2.5 mg total) by mouth 2 (two) times daily.  180 capsule  3    The PMH, PSH, Social History, Family History, Medications, and allergies have been reviewed in [facility], and have been updated if relevant.  Physical Exam  BP 140/58  Pulse 72  Temp 99.3 F (37.4 C) (Oral)  Wt 213 lb (96.616 kg)  Constitutional: He is oriented to person,  place, and time. He appears well-developed and well-nourished.  Neck: Normal range of motion.  Pulmonary/Chest: Effort normal.  Abdominal: Soft. He exhibits no mass. There is no tenderness.  Musculoskeletal: Normal range of motion.  Right paralumbar tenderness without swelling, discoloration. No sciatic tenderness on right. Distal pulses 2+.  Neg SLR and Neg fabers bilaterally. Neurological: He is alert and oriented to person, place, and time. He has normal reflexes. No sensory deficit.  Normal gait. Skin: Skin is warm and dry.  Psychiatric: He has a normal mood and affect.     Dg Lumbar Spine Complete  07/27/2011 *RADIOLOGY REPORT* Clinical Data: Lower back pain LUMBAR SPINE - COMPLETE 4+ VIEW Comparison: 03/18/2007 MRI Findings: L5 S1 degenerative disc disease. Multilevel facet arthropathy. Mild multilevel degenerative change and anterior osteophyte formation. T11-12 degenerative disc disease with height loss. No acute fracture or dislocation identified. No aggressive osseous lesion. Aortic atherosclerosis. Overlying soft tissues otherwise within normal range. Bowel gas overlies the sacrum. SI joints grossly intact. IMPRESSION: Multilevel degenerative changes. No acute osseous abnormality identified of the lumbar spine. Original Report Authenticated By: Waneta Martins, M.D.   Assessment and Plan:  1. Back pain    Lumbar strain- improving. Will add flexeril 5 mg three times daily prn muscle spasms. Given exercises from sports med advisor. See pt instructions for complete details.

## 2011-09-04 ENCOUNTER — Ambulatory Visit (INDEPENDENT_AMBULATORY_CARE_PROVIDER_SITE_OTHER): Payer: Medicare Other | Admitting: Cardiovascular Disease

## 2011-09-04 ENCOUNTER — Encounter: Payer: Self-pay | Admitting: Cardiovascular Disease

## 2011-09-04 VITALS — BP 132/70 | HR 63 | Ht 71.0 in | Wt 211.0 lb

## 2011-09-04 DIAGNOSIS — I2510 Atherosclerotic heart disease of native coronary artery without angina pectoris: Secondary | ICD-10-CM

## 2011-09-04 NOTE — Assessment & Plan Note (Signed)
Stable. He is on good therapy. No changes today. Continue ASA/Plavix/beta blocker/Ace-inh/Vytorin. Recheck lipids and LFTs in November 2013. BP well controlled.

## 2011-09-04 NOTE — Patient Instructions (Addendum)
Your physician wants you to follow-up in: 12 months. You will receive a reminder letter in the mail two months in advance. If you don't receive a letter, please call our office to schedule the follow-up appointment.  Your physician recommends that you return for fasting lab work in November . The lab opens at 8:30 daily

## 2011-09-04 NOTE — Progress Notes (Signed)
History of Present Illness: 78 yo WM with h/o CAD, HTN, HLD here today for cardiac follow up. He has been followed in the past by Dr. Juanda Chance. In 1998 he had PTCA of the LAD. In 2005 he had a drug-eluting stent to the LAD and then a short time later had a second drug-eluting stent to the LAD overlapping the first for what I believe was an edge tear. I last saw him in February  2013. 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   He is here today for follow up. He has had no recent chest pain, shortness of breath or palpitations. No dizziness, near syncope or syncope. He has been very active. He twisted his back last month lifting fertilizer bags and has been seen in the ED.   Primary Care Physician: Ruthe Mannan  Last Lipid Profile:  Lipid Panel     Component Value Date/Time   CHOL 159 12/02/2010 0819   TRIG 185.0* 12/02/2010 0819   HDL 43.70 12/02/2010 0819   CHOLHDL 4 12/02/2010 0819   VLDL 37.0 12/02/2010 0819   LDLCALC 78 12/02/2010 0819     Past Medical History  Diagnosis Date  . Impotence     of organic nature  . Prostate cancer     s/p prostatectomy  . CAD (coronary artery disease)     PTCA 1998 LAD-s/p overlapping DES in LAD in 05/2003  . HTN (hypertension)   . Hyperlipidemia   . History of colonoscopy   . Elevated glucose   . Spinal disease     lumbosacral  . Chest pain 5/5-5/11/2003    Hospital MI, CAD, INCR, CHOL, HTN    Past Surgical History  Procedure Date  . Pilonidal cystectomy late 84s  . Angioplasty 1992  . Bone spur right foot     Bilateral  . Prostatectomy   . Laminectomy   . Left shoulder decompression   . Right carpal tunnel repair 11/18/2007    Dr. Teressa Senter  . Coronary angioplasty 06/02/2003    PTCA, occl of vessel w/good collaterals    Current Outpatient Prescriptions  Medication Sig Dispense Refill  . aspirin (BAYER CHILDRENS ASPIRIN) 81 MG chewable tablet Chew 81 mg by mouth daily.         . calcium carbonate (OS-CAL) 600 MG TABS Take 600 mg by mouth daily.        . clopidogrel (PLAVIX) 75 MG tablet Take 1 tablet (75 mg total) by mouth daily.  90 tablet  3  . ezetimibe-simvastatin (VYTORIN) 10-40 MG per tablet Take 1 tablet by mouth at bedtime.  90 tablet  3  . Fish Oil OIL 1 tablet by Does not apply route daily.        . metoprolol (LOPRESSOR) 50 MG tablet Take 1 tablet (50 mg total) by mouth 2 (two) times daily.  180 tablet  3  . nitroGLYCERIN (NITROSTAT) 0.4 MG SL tablet Place 0.4 mg under the tongue every 5 (five) minutes as needed. For chest pain      . ramipril (ALTACE) 2.5 MG capsule Take 1 capsule (2.5 mg total) by mouth 2 (two) times daily.  180 capsule  3    Allergies  Allergen Reactions  . Tetracycline     REACTION: unspecified    History   Social History  . Marital Status: Married    Spouse Name: N/A    Number of Children: 3  .  Years of Education: N/A   Occupational History  . 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    Social History Main Topics  . Smoking status: Never Smoker   . Smokeless tobacco: Current User    Types: Chew  . Alcohol Use: 0.5 oz/week    1 drink(s) per week     occassionally  . Drug Use: No  . Sexually Active: No   Other Topics Concern  . 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    Family History  Problem Relation Age of Onset  . Prostate cancer Father   . Cancer Father     prostate  . Colon cancer Mother   . Leukemia Mother   . Cancer Mother     colon, leukemia    Review of Systems:  As stated in the HPI and otherwise negative.   BP 132/70  Pulse 63  Ht 5\' 11"  (1.803 m)  Wt 211 lb (95.709 kg)  BMI 29.43 kg/m2  Physical Examination: General: Well developed, well nourished, NAD HEENT: OP clear, mucus membranes moist SKIN: warm, dry. No  rashes. Neuro: No focal deficits Musculoskeletal: Muscle strength 5/5 all ext Psychiatric: Mood and affect normal Neck: No JVD, no carotid bruits, no thyromegaly, no lymphadenopathy. Lungs:Clear bilaterally, no wheezes, rhonci, crackles Cardiovascular: Regular rate and rhythm. No murmurs, gallops or rubs. Abdomen:Soft. Bowel sounds present. Non-tender.  Extremities: No lower extremity edema. Pulses are 2 + in the bilateral DP/PT.  EKG: NSR, rate 63 bpm.

## 2011-11-03 ENCOUNTER — Encounter: Payer: Self-pay | Admitting: Gastroenterology

## 2011-11-06 ENCOUNTER — Encounter: Payer: Self-pay | Admitting: Gastroenterology

## 2011-12-02 ENCOUNTER — Other Ambulatory Visit (INDEPENDENT_AMBULATORY_CARE_PROVIDER_SITE_OTHER): Payer: Medicare Other

## 2011-12-02 DIAGNOSIS — I2510 Atherosclerotic heart disease of native coronary artery without angina pectoris: Secondary | ICD-10-CM

## 2011-12-02 LAB — LIPID PANEL
Cholesterol: 152 mg/dL (ref 0–200)
HDL: 40.1 mg/dL (ref >39.00)
Total CHOL/HDL Ratio: 4
Triglycerides: 146 mg/dL (ref 0.0–149.0)

## 2011-12-02 LAB — HEPATIC FUNCTION PANEL
AST: 22 U/L (ref 0–37)
Albumin: 4.3 g/dL (ref 3.5–5.2)
Total Protein: 7.1 g/dL (ref 6.0–8.3)

## 2011-12-08 ENCOUNTER — Telehealth: Payer: Self-pay

## 2011-12-08 ENCOUNTER — Encounter: Payer: Self-pay | Admitting: Gastroenterology

## 2011-12-08 ENCOUNTER — Ambulatory Visit (INDEPENDENT_AMBULATORY_CARE_PROVIDER_SITE_OTHER): Payer: Medicare Other | Admitting: Gastroenterology

## 2011-12-08 ENCOUNTER — Encounter: Payer: Self-pay | Admitting: Cardiovascular Disease

## 2011-12-08 VITALS — BP 140/70 | HR 64 | Ht 70.5 in | Wt 210.0 lb

## 2011-12-08 DIAGNOSIS — Z800 Family history of malignant neoplasm of digestive organs: Secondary | ICD-10-CM

## 2011-12-08 DIAGNOSIS — Z1211 Encounter for screening for malignant neoplasm of colon: Secondary | ICD-10-CM

## 2011-12-08 MED ORDER — PEG-KCL-NACL-NASULF-NA ASC-C 100 G PO SOLR: 1.0000 | Freq: Once | ORAL | Status: DC

## 2011-12-08 NOTE — Patient Instructions (Addendum)
We will get in touch with Dr. Hassell Halim about holding your plavix for 5 days prior to colonoscopy. You will be contacted by our office prior to your procedure for directions on holding your Plavix.  If you do not hear from our office 1 week prior to your scheduled procedure, please call [phone] to discuss.  You will be set up for a colonoscopy (moderate sedation, LEC) for FH of colon cancer. You have been scheduled for a colonoscopy . Please follow written instructions given to you at your visit today.  Please pick up your prep kit at the pharmacy within the next 1-3 days. If you use inhalers (even only as needed) or a CPAP machine, please bring them with you on the day of your procedure.

## 2011-12-08 NOTE — Progress Notes (Signed)
HPI: This is a    very pleasant 78 year old man whom I am meeting for the first time today.  Mother had colon cancer, she was in her 39s or 38s, lived to be in her 25s.  Father lived to 38.  He is on plavix, CAD with coronary stents in place.  Dr. Charlies Constable did the interventions. Sees Dr. Ranelle Oyster.  Stenting was a long time ago from review of cardiology records.  He had a colonoscopy with Dr. Doreatha Martin January 2008. He found an 8 mm pedunculated polyp in the sigmoid colon. This proved to be a hyperplastic polyp on biopsy. Given his family history he was recommended to have repeat colonoscopy in 5 years.  He has had no bowel troubles, no bleeding, changes in his bowels. No significant abdominal pains.   Review of systems: Pertinent positive and negative review of systems were noted in the above HPI section. Complete review of systems was performed and was otherwise normal.    Past Medical History  Diagnosis Date  . Impotence     of organic nature  . Prostate cancer     s/p prostatectomy  . CAD (coronary artery disease)     PTCA 1998 LAD-s/p overlapping DES in LAD in 05/2003  . HTN (hypertension)   . Hyperlipidemia   . History of colonoscopy   . Elevated glucose   . Spinal disease     lumbosacral  . Chest pain 5/5-5/11/2003    Hospital MI, CAD, INCR, CHOL, HTN    Past Surgical History  Procedure Date  . Pilonidal cystectomy late 81s  . Angioplasty 1992  . Bone spur right foot     Bilateral  . Prostatectomy   . Laminectomy   . Left shoulder decompression   . Right carpal tunnel repair 11/18/2007    Dr. Teressa Senter  . Coronary angioplasty 06/02/2003    PTCA, occl of vessel w/good collaterals    Current Outpatient Prescriptions  Medication Sig Dispense Refill  . aspirin (BAYER CHILDRENS ASPIRIN) 81 MG chewable tablet Chew 81 mg by mouth daily.        . calcium carbonate (OS-CAL) 600 MG TABS Take 600 mg by mouth daily.        . clopidogrel (PLAVIX) 75 MG tablet Take 1 tablet  (75 mg total) by mouth daily.  90 tablet  3  . ezetimibe-simvastatin (VYTORIN) 10-40 MG per tablet Take 1 tablet by mouth at bedtime.  90 tablet  3  . Fish Oil OIL 1 tablet by Does not apply route daily.        . metoprolol (LOPRESSOR) 50 MG tablet Take 1 tablet (50 mg total) by mouth 2 (two) times daily.  180 tablet  3  . nitroGLYCERIN (NITROSTAT) 0.4 MG SL tablet Place 0.4 mg under the tongue every 5 (five) minutes as needed. For chest pain      . ramipril (ALTACE) 2.5 MG capsule Take 1 capsule (2.5 mg total) by mouth 2 (two) times daily.  180 capsule  3    Allergies as of 12/08/2011 - Review Complete 12/08/2011  Allergen Reaction Noted  . Tetracycline  11/26/2006    Family History  Problem Relation Age of Onset  . Prostate cancer Father   . Cancer Father     prostate  . Colon cancer Mother   . Leukemia Mother   . Cancer Mother     colon, leukemia    History   Social History  . Marital Status: Married    Spouse  Name: N/A    Number of Children: 3  . Years of Education: N/A   Occupational History  . 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    Social History Main Topics  . Smoking status: Never Smoker   . Smokeless tobacco: Current User    Types: Chew  . Alcohol Use: 0.5 oz/week    1 drink(s) per week     Comment: occassionally  . Drug Use: No  . Sexually Active: No   Other Topics Concern  . 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       Physical Exam: BP 140/70  Pulse 64  Ht 5' 10.5" (1.791 m)  Wt 210 lb (95.255 kg)  BMI 29.71 kg/m2 Constitutional: generally well-appearing Psychiatric: alert and oriented x3 Eyes: extraocular movements intact Mouth: oral pharynx moist, no lesions Neck: supple no lymphadenopathy Cardiovascular: heart regular rate and rhythm Lungs: clear to auscultation  bilaterally Abdomen: soft, nontender, nondistended, no obvious ascites, no peritoneal signs, normal bowel sounds Extremities: no lower extremity edema bilaterally Skin: no lesions on visible extremities    Assessment and plan: 78 y.o. male with  family history colon cancer, elevated risk for procedural bleeding given the ongoing Plavix use.  We will discuss with his cardiologist about him holding his Plavix for 5 days prior to a colonoscopy. He is 78 years old and oftentimes screening is stopped between 73 and 52 however he looks very healthy overall and both his parents lived into their 72s, his mother in her 22s as I think screening is still a reasonable clinical question for him. We'll proceed with colonoscopy at his soonest convenience.

## 2011-12-08 NOTE — Telephone Encounter (Signed)
12/08/2011    RE: [name] DOB: 06-24-1932 MRN: [id number]   Dear Dr Hassell Halim,    We have scheduled the above patient for an endoscopic procedure. Our records show that he is on anticoagulation therapy.   Please advise as to how long the patient may come off his therapy of Plavix prior to the procedure, which is scheduled for 01/06/12.  Please fax back/ or route the completed form to Janeice Stegall at [phone].   Sincerely,  [facility])   Please respond by 12/28/11 and route back to [facility])

## 2011-12-09 NOTE — Telephone Encounter (Signed)
Pt has been notified that he can hold his plavix 5 days prior to the procedure

## 2011-12-09 NOTE — Telephone Encounter (Signed)
See letter.

## 2011-12-09 NOTE — Telephone Encounter (Signed)
Dr. Clifton James has written letter to Dr. Christella Hartigan.  It is in [employer]

## 2011-12-30 ENCOUNTER — Other Ambulatory Visit: Payer: Medicare Other | Admitting: Gastroenterology

## 2012-01-06 ENCOUNTER — Other Ambulatory Visit: Payer: Medicare Other | Admitting: Gastroenterology

## 2012-01-12 ENCOUNTER — Encounter: Payer: Self-pay | Admitting: Gastroenterology

## 2012-01-12 ENCOUNTER — Ambulatory Visit (AMBULATORY_SURGERY_CENTER): Payer: Medicare Other | Admitting: Gastroenterology

## 2012-01-12 VITALS — BP 124/70 | HR 80 | Temp 98.5°F | Resp 16 | Ht 70.5 in | Wt 210.0 lb

## 2012-01-12 DIAGNOSIS — Z800 Family history of malignant neoplasm of digestive organs: Secondary | ICD-10-CM

## 2012-01-12 DIAGNOSIS — Z1211 Encounter for screening for malignant neoplasm of colon: Secondary | ICD-10-CM

## 2012-01-12 MED ORDER — SODIUM CHLORIDE 0.9 % IV SOLN: 500.0000 mL | INTRAVENOUS | Status: DC

## 2012-01-12 NOTE — Op Note (Signed)
[facility] [address] [place], [address]   COLONOSCOPY PROCEDURE REPORT  PATIENT: [name], [name]  MR#: [id number] BIRTHDATE: 1932-03-23 , 79  yrs. old GENDER: Male ENDOSCOPIST: Rachael Fee, MD PROCEDURE DATE:  01/12/2012 PROCEDURE:   Colonoscopy, diagnostic ASA CLASS:   Class III INDICATIONS:elevated risk screening, mother had colon cancer MEDICATIONS: Fentanyl 100 mcg IV, Versed 10 mg IV, and These medications were titrated to patient response per physician's verbal order  DESCRIPTION OF PROCEDURE:   After the risks benefits and alternatives of the procedure were thoroughly explained, informed consent was obtained.  A digital rectal exam revealed no abnormalities of the rectum.   The LB [id number]  endoscope was introduced through the anus and advanced to the cecum, which was identified by both the appendix and ileocecal valve. No adverse events experienced.   The quality of the prep was good, using MoviPrep  The instrument was then slowly withdrawn as the colon was fully examined.   COLON FINDINGS: A normal appearing cecum, ileocecal valve, and appendiceal orifice were identified.  The ascending, hepatic flexure, transverse, splenic flexure, descending, sigmoid colon and rectum appeared unremarkable.  No polyps or cancers were seen. Retroflexed views revealed no abnormalities. The time to cecum=6 minutes 27 seconds.  Withdrawal time=8 minutes 08 seconds.  The scope was withdrawn and the procedure completed. COMPLICATIONS: There were no complications.  ENDOSCOPIC IMPRESSION: Normal colon; no polyps or cancers  RECOMMENDATIONS: Given your age, you will not need another colonoscopy for colon cancer screening or polyp surveillance.  These types of tests usually stop around the age 65.   eSigned:  Rachael Fee, MD 01/12/2012 2:17 PM   cc:  Ruthe Mannan, MD

## 2012-01-12 NOTE — Patient Instructions (Addendum)
You can restart your Plavix TODAY  YOU HAD AN ENDOSCOPIC PROCEDURE TODAY AT THE [facility]: Refer to the procedure report that was given to you for any specific questions about what was found during the examination.  If the procedure report does not answer your questions, please call your gastroenterologist to clarify.  If you requested that your care partner not be given the details of your procedure findings, then the procedure report has been included in a sealed envelope for you to review at your convenience later.  YOU SHOULD EXPECT: Some feelings of bloating in the abdomen. Passage of more gas than usual.  Walking can help get rid of the air that was put into your GI tract during the procedure and reduce the bloating. If you had a lower endoscopy (such as a colonoscopy or flexible sigmoidoscopy) you may notice spotting of blood in your stool or on the toilet paper. If you underwent a bowel prep for your procedure, then you may not have a normal bowel movement for a few days.  DIET: Your first meal following the procedure should be a light meal and then it is ok to progress to your normal diet.  A half-sandwich or bowl of soup is an example of a good first meal.  Heavy or fried foods are harder to digest and may make you feel nauseous or bloated.  Likewise meals heavy in dairy and vegetables can cause extra gas to form and this can also increase the bloating.  Drink plenty of fluids but you should avoid alcoholic beverages for 24 hours.  ACTIVITY: Your care partner should take you home directly after the procedure.  You should plan to take it easy, moving slowly for the rest of the day.  You can resume normal activity the day after the procedure however you should NOT DRIVE or use heavy machinery for 24 hours (because of the sedation medicines used during the test).    SYMPTOMS TO REPORT IMMEDIATELY: A gastroenterologist can be reached at any hour.  During normal business hours, 8:30 AM  to 5:00 PM Monday through Friday, call [phone].  After hours and on weekends, please call the GI answering service at [phone] who will take a message and have the physician on call contact you.   Following lower endoscopy (colonoscopy or flexible sigmoidoscopy):  Excessive amounts of blood in the stool  Significant tenderness or worsening of abdominal pains  Swelling of the abdomen that is new, acute  Fever of 100F or higher FOLLOW UP: If any biopsies were taken you will be contacted by phone or by letter within the next 1-3 weeks.  Call your gastroenterologist if you have not heard about the biopsies in 3 weeks.  Our staff will call the home number listed on your records the next business day following your procedure to check on you and address any questions or concerns that you may have at that time regarding the information given to you following your procedure. This is a courtesy call and so if there is no answer at the home number and we have not heard from you through the emergency physician on call, we will assume that you have returned to your regular daily activities without incident.  SIGNATURES/CONFIDENTIALITY: You and/or your care partner have signed paperwork which will be entered into your electronic medical record.  These signatures attest to the fact that that the information above on your After Visit Summary has been reviewed and is understood.  Full  responsibility of the confidentiality of this discharge information lies with you and/or your care-partner.  

## 2012-01-12 NOTE — Progress Notes (Signed)
Patient did not experience any of the following events: a burn prior to discharge; a fall within the facility; wrong site/side/patient/procedure/implant event; or a hospital transfer or hospital admission upon discharge from the facility. (G8907) Patient did not have preoperative order for IV antibiotic SSI prophylaxis. (G8918)  

## 2012-01-13 ENCOUNTER — Telehealth: Payer: Self-pay | Admitting: *Deleted

## 2012-01-13 NOTE — Telephone Encounter (Signed)
  Follow up Call-  Call back number 01/12/2012  Post procedure Call Back phone  # [phone]  Permission to leave phone message Yes     Patient questions:  Do you have a fever, pain , or abdominal swelling? no Pain Score  0 *  Have you tolerated food without any problems? yes  Have you been able to return to your normal activities? yes  Do you have any questions about your discharge instructions: Diet   no Medications  no Follow up visit  no  Do you have questions or concerns about your Care? no  Actions: * If pain score is 4 or above: No action needed, pain <4.

## 2012-02-26 ENCOUNTER — Telehealth: Payer: Self-pay | Admitting: *Deleted

## 2012-02-26 NOTE — Telephone Encounter (Signed)
Wife, Venita Sheffield dropped off Permanent Disability Parking Placard form for Dr. Dayton Martes to evaluate.    Please call [phone] when ready to pick up.

## 2012-02-27 NOTE — Telephone Encounter (Signed)
Form signed and on my desk. 

## 2012-03-01 NOTE — Telephone Encounter (Signed)
Left message advising patient form is ready for pick up, form placed at front desk.

## 2012-04-17 ENCOUNTER — Other Ambulatory Visit: Payer: Self-pay | Admitting: Cardiovascular Disease

## 2012-06-02 ENCOUNTER — Telehealth: Payer: Self-pay | Admitting: Cardiovascular Disease

## 2012-06-02 NOTE — Telephone Encounter (Signed)
Spoke with patient. Pt states for about 10 days he has had a sensation of his head spinning and feeling slightly disoriented. This only lasts for a few seconds. It is not associated with any other symptoms including blurred vision facial drooping,ringing in his ears, or muscle weakness. He states it happens in the morning when he first gets up or during the day when he leans over. He read that both ramipril and metoprolol can cause dizziness. About a week ago he decreased each of them to once a day and stopped them altogether about 3 days ago. His symptoms have not changed. He states his BP today was 125/73 (he is not sure his machine is accurate) and he does not know his heart rate. He denies palpitations. I will forward to Dr Clifton James for review.

## 2012-06-02 NOTE — Telephone Encounter (Signed)
Spoke with patient. He will come see Dr Clifton James tomorrow. Advised pt not to drive.

## 2012-06-02 NOTE — Telephone Encounter (Signed)
I could see him tomorrow at 11:45 if he wishes. chris

## 2012-06-02 NOTE — Telephone Encounter (Signed)
New problem    Per pt experiencing dizziness in the mornings-also during the day- thinks it could be one of his medications but not sure which one

## 2012-06-03 ENCOUNTER — Ambulatory Visit (INDEPENDENT_AMBULATORY_CARE_PROVIDER_SITE_OTHER): Payer: Medicare PPO | Admitting: Cardiovascular Disease

## 2012-06-03 ENCOUNTER — Encounter: Payer: Self-pay | Admitting: Cardiovascular Disease

## 2012-06-03 VITALS — BP 149/73 | HR 63 | Ht 70.0 in | Wt 211.0 lb

## 2012-06-03 DIAGNOSIS — I2510 Atherosclerotic heart disease of native coronary artery without angina pectoris: Secondary | ICD-10-CM

## 2012-06-03 DIAGNOSIS — R42 Dizziness and giddiness: Secondary | ICD-10-CM

## 2012-06-03 MED ORDER — RAMIPRIL 2.5 MG PO CAPS: 2.5000 mg | ORAL_CAPSULE | Freq: Two times a day (BID) | ORAL | Status: DC

## 2012-06-03 MED ORDER — EZETIMIBE-SIMVASTATIN 10-40 MG PO TABS: 1.0000 | ORAL_TABLET | Freq: Every day | ORAL | Status: DC

## 2012-06-03 MED ORDER — CLOPIDOGREL BISULFATE 75 MG PO TABS: 75.0000 mg | ORAL_TABLET | Freq: Every day | ORAL | Status: DC

## 2012-06-03 MED ORDER — METOPROLOL TARTRATE 50 MG PO TABS: 50.0000 mg | ORAL_TABLET | Freq: Two times a day (BID) | ORAL | Status: DC

## 2012-06-03 NOTE — Patient Instructions (Addendum)
Your physician wants you to follow-up in:  3 months.  You will receive a reminder letter in the mail two months in advance. If you don't receive a letter, please call our office to schedule the follow-up appointment.  Your physician has requested that you have a carotid duplex. This test is an ultrasound of the carotid arteries in your neck. It looks at blood flow through these arteries that supply the brain with blood. Allow one hour for this exam. There are no restrictions or special instructions.

## 2012-06-03 NOTE — Progress Notes (Signed)
History of Present Illness: 79 yo WM with h/o CAD, HTN, HLD here today as an add on for dizziness.  He has been followed in the past by Dr. Juanda Chance. In 1998 he had PTCA of the LAD. In 2005 he had a drug-eluting stent to the LAD and then a short time later had a second drug-eluting stent to the LAD overlapping the first for what I believe was an edge tear. I last saw him in August 2013. 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   He is here today for follow up. He has had no recent chest pain, shortness of breath or palpitations. He describes his head spinning when first standing. He has not passed out. He has noticed this lately when bending over. He has been busy in the yard and had no exertional chest pain or pressure, no exertional SOB. He has had a recent sinus infection and this started after his sinus congestion.   BP rises with standing and HR stays same. (see vitals)  Primary Care Physician: Ruthe Mannan  Last Lipid Profile:Lipid Panel     Component Value Date/Time   CHOL 152 12/02/2011 0809   TRIG 146.0 12/02/2011 0809   HDL 40.10 12/02/2011 0809   CHOLHDL 4 12/02/2011 0809   VLDL 29.2 12/02/2011 0809   LDLCALC 83 12/02/2011 0809     Past Medical History  Diagnosis Date  . Impotence     of organic nature  . Prostate cancer     s/p prostatectomy  . CAD (coronary artery disease)     PTCA 1998 LAD-s/p overlapping DES in LAD in 05/2003  . HTN (hypertension)   . Hyperlipidemia   . History of colonoscopy   . Elevated glucose   . Spinal disease     lumbosacral  . Chest pain 5/5-5/11/2003    Hospital MI, CAD, INCR, CHOL, HTN    Past Surgical History  Procedure Laterality Date  . Pilonidal cystectomy  late 44s  . Angioplasty  1992  . Bone spur right foot      Bilateral  . Prostatectomy    . Laminectomy    . Left shoulder decompression    . Right carpal tunnel repair  11/18/2007    Dr. Teressa Senter  . Coronary  angioplasty  06/02/2003    PTCA, occl of vessel w/good collaterals    Current Outpatient Prescriptions  Medication Sig Dispense Refill  . aspirin (BAYER CHILDRENS ASPIRIN) 81 MG chewable tablet Chew 81 mg by mouth daily.        . calcium carbonate (OS-CAL) 600 MG TABS Take 600 mg by mouth daily.        . clopidogrel (PLAVIX) 75 MG tablet TAKE 1 TABLET DAILY  90 tablet  2  . ezetimibe-simvastatin (VYTORIN) 10-40 MG per tablet Take 1 tablet by mouth at bedtime.  90 tablet  3  . Fish Oil OIL 1 tablet by Does not apply route daily.        . metoprolol (LOPRESSOR) 50 MG tablet Take 1 tablet (50 mg total) by mouth 2 (two) times daily.  180 tablet  3  . nitroGLYCERIN (NITROSTAT) 0.4 MG SL tablet Place 0.4 mg under the tongue every 5 (five) minutes as needed. For chest pain      . ramipril (ALTACE) 2.5 MG capsule Take 1 capsule (2.5 mg total) by mouth 2 (two) times daily.  180 capsule  3  No current facility-administered medications for this visit.    Allergies  Allergen Reactions  . Tetracycline     REACTION: unspecified    History   Social History  . Marital Status: Married    Spouse Name: N/A    Number of Children: 3  . Years of Education: N/A   Occupational History  . 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    Social History Main Topics  . Smoking status: Never Smoker   . Smokeless tobacco: Current User    Types: Chew  . Alcohol Use: 0.5 oz/week    1 drink(s) per week     Comment: occassionally  . Drug Use: No  . Sexually Active: No   Other Topics Concern  . 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    Family History  Problem Relation Age of Onset  . Prostate cancer Father   . Cancer Father     prostate  . Colon cancer Mother   . Leukemia Mother   . Cancer Mother     colon, leukemia     Review of Systems:  As stated in the HPI and otherwise negative.   BP 157/77  Pulse 65  Ht 5\' 10"  (1.778 m)  Wt 211 lb (95.709 kg)  BMI 30.28 kg/m2  Physical Examination: General: Well developed, well nourished, NAD HEENT: OP clear, mucus membranes moist SKIN: warm, dry. No rashes. Neuro: No focal deficits Musculoskeletal: Muscle strength 5/5 all ext Psychiatric: Mood and affect normal Neck: No JVD, no carotid bruits, no thyromegaly, no lymphadenopathy. Lungs:Clear bilaterally, no wheezes, rhonci, crackles Cardiovascular: Regular rate and rhythm. No murmurs, gallops or rubs. Abdomen:Soft. Bowel sounds present. Non-tender.  Extremities: No lower extremity edema. Pulses are 2 + in the bilateral DP/PT.  EKG: sinus brady, rate 54 bpm. Non-specific T wave abnormality.   Assessment and Plan:   1. CAD: Stable. He is on good therapy. No changes today. Continue ASA/Plavix/beta blocker/Ace-inh/Vytorin.   2. Dizziness: Positional. Orthostatics ok. Encourage good po intake. EKG without ischemic findings. Will arrange carotid artery dopplers to exclude obstructive carotid artery disease. This sounds like vertigo. If no cardiac findings, will have him f/u in primary care for treatment of vertigo. I suspect this will get better when his sinus issues resolve.

## 2012-06-11 ENCOUNTER — Encounter (INDEPENDENT_AMBULATORY_CARE_PROVIDER_SITE_OTHER): Payer: Medicare PPO

## 2012-06-11 DIAGNOSIS — R42 Dizziness and giddiness: Secondary | ICD-10-CM

## 2012-06-11 DIAGNOSIS — I2510 Atherosclerotic heart disease of native coronary artery without angina pectoris: Secondary | ICD-10-CM

## 2012-06-11 DIAGNOSIS — I6529 Occlusion and stenosis of unspecified carotid artery: Secondary | ICD-10-CM

## 2012-09-06 ENCOUNTER — Ambulatory Visit (INDEPENDENT_AMBULATORY_CARE_PROVIDER_SITE_OTHER): Payer: Medicare PPO | Admitting: Cardiovascular Disease

## 2012-09-06 ENCOUNTER — Encounter: Payer: Self-pay | Admitting: Cardiovascular Disease

## 2012-09-06 VITALS — BP 126/72 | HR 61 | Ht 70.0 in | Wt 213.0 lb

## 2012-09-06 DIAGNOSIS — I2510 Atherosclerotic heart disease of native coronary artery without angina pectoris: Secondary | ICD-10-CM

## 2012-09-06 NOTE — Progress Notes (Signed)
History of Present Illness: 79 yo WM with h/o CAD, HTN, HLD here today for cardiac follow up. [name] has been followed in the past by Dr. Juanda Chance. In 1998 [name] had PTCA of the LAD. In 2005 [name] had a drug-eluting stent to the LAD and then a short time later had a second drug-eluting stent to the LAD overlapping the first for what I believe was an edge tear. I last saw him in May 2014 with c/o dizziness. Orthostatics were normal. 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 Carotid dopplers may 2014 with mild bilateral ICA stenosis.   [name] is here today for follow up. [name] has had no recent chest pain, shortness of breath or palpitations. Dizziness has resolved.   Primary Care Physician: Ruthe Mannan  Last Lipid Profile:Lipid Panel     Component Value Date/Time   CHOL 152 12/02/2011 0809   TRIG 146.0 12/02/2011 0809   HDL 40.10 12/02/2011 0809   CHOLHDL 4 12/02/2011 0809   VLDL 29.2 12/02/2011 0809   LDLCALC 83 12/02/2011 0809     Past Medical History  Diagnosis Date  . Impotence     of organic nature  . Prostate cancer     s/p prostatectomy  . CAD (coronary artery disease)     PTCA 1998 LAD-s/p overlapping DES in LAD in 05/2003  . HTN (hypertension)   . Hyperlipidemia   . History of colonoscopy   . Elevated glucose   . Spinal disease     lumbosacral  . Chest pain 5/5-5/11/2003    Hospital MI, CAD, INCR, CHOL, HTN    Past Surgical History  Procedure Laterality Date  . Pilonidal cystectomy  late 33s  . Angioplasty  1992  . Bone spur right foot      Bilateral  . Prostatectomy    . Laminectomy    . Left shoulder decompression    . Right carpal tunnel repair  11/18/2007    Dr. Teressa Senter  . Coronary angioplasty  06/02/2003    PTCA, occl of vessel w/good collaterals    Current Outpatient Prescriptions  Medication Sig Dispense Refill  . aspirin (BAYER CHILDRENS ASPIRIN) 81 MG chewable tablet Chew 81 mg by mouth daily.        .  calcium carbonate (OS-CAL) 600 MG TABS Take 600 mg by mouth daily.        . clopidogrel (PLAVIX) 75 MG tablet Take 1 tablet (75 mg total) by mouth daily.  90 tablet  3  . ezetimibe-simvastatin (VYTORIN) 10-40 MG per tablet Take 1 tablet by mouth at bedtime.  90 tablet  3  . Fish Oil OIL 1 tablet by Does not apply route daily.        . metoprolol (LOPRESSOR) 50 MG tablet Take 1 tablet (50 mg total) by mouth 2 (two) times daily.  180 tablet  3  . nitroGLYCERIN (NITROSTAT) 0.4 MG SL tablet Place 0.4 mg under the tongue every 5 (five) minutes as needed. For chest pain      . ramipril (ALTACE) 2.5 MG capsule Take 1 capsule (2.5 mg total) by mouth 2 (two) times daily.  180 capsule  3   No current facility-administered medications for this visit.    Allergies  Allergen Reactions  . Tetracycline     REACTION: unspecified    History   Social History  . Marital Status: Married    Spouse Name: N/A  Number of Children: 3  . Years of Education: N/A   Occupational History  . 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    Social History Main Topics  . Smoking status: Never Smoker   . Smokeless tobacco: Current User    Types: Chew  . Alcohol Use: 0.5 oz/week    1 drink(s) per week     Comment: occassionally  . Drug Use: No  . Sexually Active: No   Other Topics Concern  . 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    Family History  Problem Relation Age of Onset  . Prostate cancer Father   . Cancer Father     prostate  . Colon cancer Mother   . Leukemia Mother   . Cancer Mother     colon, leukemia    Review of Systems:  As stated in the HPI and otherwise negative.   BP 126/72  Pulse 61  Ht 5\' 10"  (1.778 m)  Wt 213 lb (96.616 kg)  BMI 30.56 kg/m2  SpO2 99%  Physical Examination: General: Well  developed, well nourished, NAD HEENT: OP clear, mucus membranes moist SKIN: warm, dry. No rashes. Neuro: No focal deficits Musculoskeletal: Muscle strength 5/5 all ext Psychiatric: Mood and affect normal Neck: No JVD, no carotid bruits, no thyromegaly, no lymphadenopathy. Lungs:Clear bilaterally, no wheezes, rhonci, crackles Cardiovascular: Regular rate and rhythm. No murmurs, gallops or rubs. Abdomen:Soft. Bowel sounds present. Non-tender.  Extremities: No lower extremity edema. Pulses are 2 + in the bilateral DP/PT.  Assessment and Plan:   1. CAD: Stable. [name] is on good therapy. No changes today. Continue ASA/Plavix/beta blocker/Ace-inh/Vytorin. BP well controlled. Lipids and LFTs at next visit.   2. Dizziness: Positional. Resolved. LIkely vertigo.

## 2012-09-06 NOTE — Patient Instructions (Addendum)
Your physician wants you to follow-up in:  6 months. You will receive a reminder letter in the mail two months in advance. If you don't receive a letter, please call our office to schedule the follow-up appointment.   Your physician recommends that you return for fasting lab work in:  6 months on day of appt with Dr. Clifton James.--Lipid and Liver profiles

## 2012-12-02 ENCOUNTER — Other Ambulatory Visit: Payer: Self-pay

## 2013-06-06 ENCOUNTER — Other Ambulatory Visit (HOSPITAL_COMMUNITY): Payer: Self-pay | Admitting: Cardiology

## 2013-06-06 DIAGNOSIS — I6529 Occlusion and stenosis of unspecified carotid artery: Secondary | ICD-10-CM

## 2013-06-16 ENCOUNTER — Ambulatory Visit (HOSPITAL_COMMUNITY): Payer: Medicare PPO | Attending: Cardiovascular Disease | Admitting: Cardiology

## 2013-06-16 DIAGNOSIS — I6529 Occlusion and stenosis of unspecified carotid artery: Secondary | ICD-10-CM

## 2013-06-16 NOTE — Progress Notes (Signed)
Carotid duplex complete 

## 2013-07-05 ENCOUNTER — Other Ambulatory Visit: Payer: Self-pay | Admitting: *Deleted

## 2013-07-05 DIAGNOSIS — I2510 Atherosclerotic heart disease of native coronary artery without angina pectoris: Secondary | ICD-10-CM

## 2013-07-05 MED ORDER — RAMIPRIL 2.5 MG PO CAPS: 2.5000 mg | ORAL_CAPSULE | Freq: Two times a day (BID) | ORAL | Status: DC

## 2013-07-05 MED ORDER — METOPROLOL TARTRATE 50 MG PO TABS: 50.0000 mg | ORAL_TABLET | Freq: Two times a day (BID) | ORAL | Status: DC

## 2013-07-05 MED ORDER — EZETIMIBE-SIMVASTATIN 10-40 MG PO TABS: 1.0000 | ORAL_TABLET | Freq: Every day | ORAL | Status: DC

## 2013-07-05 MED ORDER — CLOPIDOGREL BISULFATE 75 MG PO TABS: 75.0000 mg | ORAL_TABLET | Freq: Every day | ORAL | Status: DC

## 2013-09-06 ENCOUNTER — Ambulatory Visit (INDEPENDENT_AMBULATORY_CARE_PROVIDER_SITE_OTHER): Payer: Medicare PPO | Admitting: Cardiovascular Disease

## 2013-09-06 ENCOUNTER — Encounter: Payer: Self-pay | Admitting: Cardiovascular Disease

## 2013-09-06 VITALS — BP 134/57 | HR 73 | Ht 70.0 in | Wt 216.8 lb

## 2013-09-06 DIAGNOSIS — I2510 Atherosclerotic heart disease of native coronary artery without angina pectoris: Secondary | ICD-10-CM

## 2013-09-06 DIAGNOSIS — I4949 Other premature depolarization: Secondary | ICD-10-CM

## 2013-09-06 DIAGNOSIS — I10 Essential (primary) hypertension: Secondary | ICD-10-CM

## 2013-09-06 DIAGNOSIS — I493 Ventricular premature depolarization: Secondary | ICD-10-CM

## 2013-09-06 DIAGNOSIS — I779 Disorder of arteries and arterioles, unspecified: Secondary | ICD-10-CM

## 2013-09-06 DIAGNOSIS — E785 Hyperlipidemia, unspecified: Secondary | ICD-10-CM

## 2013-09-06 DIAGNOSIS — I739 Peripheral vascular disease, unspecified: Secondary | ICD-10-CM

## 2013-09-06 LAB — HEPATIC FUNCTION PANEL
ALT: 30 U/L (ref 0–53)
AST: 25 U/L (ref 0–37)
Albumin: 4.1 g/dL (ref 3.5–5.2)
Alkaline Phosphatase: 64 U/L (ref 39–117)
Bilirubin, Direct: 0.1 mg/dL (ref 0.0–0.3)
TOTAL PROTEIN: 7 g/dL (ref 6.0–8.3)
Total Bilirubin: 1 mg/dL (ref 0.2–1.2)

## 2013-09-06 LAB — LIPID PANEL
CHOLESTEROL: 171 mg/dL (ref 0–200)
HDL: 46.6 mg/dL (ref >39.00)
LDL CALC: 92 mg/dL (ref 0–99)
NonHDL: 124.4
TRIGLYCERIDES: 163 mg/dL — AB (ref 0.0–149.0)
Total CHOL/HDL Ratio: 4
VLDL: 32.6 mg/dL (ref 0.0–40.0)

## 2013-09-06 NOTE — Patient Instructions (Signed)
Your physician wants you to follow-up in:  6 months. You will receive a reminder letter in the mail two months in advance. If you don't receive a letter, please call our office to schedule the follow-up appointment.   

## 2013-09-06 NOTE — Progress Notes (Signed)
History of Present Illness: 80 yo WM with h/o CAD, HTN, HLD, PVCs here today for cardiac follow up. He has been followed in the past by Dr. Olevia Perches. In 1998 he had PTCA of the LAD. In 2005 he had a drug-eluting stent to the LAD and then a short time later had a second drug-eluting stent to the LAD overlapping the first for what I believe was an edge tear. I last saw him in August 2014. 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 Carotid dopplers May 2015 with 40-59% RICA stenosis, 2-58% LICA stenosis.   He is here today for follow up. He has had no recent chest pain, shortness of breath or palpitations. He does describe left neck pain and left shoulder pain. This started after he moved a large refrigerator into his house last week. He has plans to see orthopedic surgery today.   Primary Care Physician: Arnette Norris  Last Lipid Profile:Lipid Panel     Component Value Date/Time   CHOL 152 12/02/2011 0809   TRIG 146.0 12/02/2011 0809   HDL 40.10 12/02/2011 0809   CHOLHDL 4 12/02/2011 0809   VLDL 29.2 12/02/2011 0809   [place] 83 12/02/2011 0809    Past Medical History  Diagnosis Date  . Impotence     of organic nature  . Prostate cancer     s/p prostatectomy  . CAD (coronary artery disease)     PTCA 1998 LAD-s/p overlapping DES in LAD in 05/2003  . HTN (hypertension)   . Hyperlipidemia   . History of colonoscopy   . Elevated glucose   . Spinal disease     lumbosacral  . Chest pain 5/5-5/11/2003    Hospital MI, CAD, INCR, CHOL, HTN    Past Surgical History  Procedure Laterality Date  . Pilonidal cystectomy  late 74s  . Angioplasty  1992  . Bone spur right foot      Bilateral  . Prostatectomy    . Laminectomy    . Left shoulder decompression    . Right carpal tunnel repair  11/18/2007    Dr. Daylene Katayama  . Coronary angioplasty  06/02/2003    PTCA, occl of vessel w/good collaterals    Current Outpatient Prescriptions    Medication Sig Dispense Refill  . aspirin (BAYER CHILDRENS ASPIRIN) 81 MG chewable tablet Chew 81 mg by mouth daily.        . calcium carbonate (OS-CAL) 600 MG TABS Take 600 mg by mouth daily.        . clopidogrel (PLAVIX) 75 MG tablet Take 1 tablet (75 mg total) by mouth daily.  90 tablet  0  . ezetimibe-simvastatin (VYTORIN) 10-40 MG per tablet Take 1 tablet by mouth at bedtime.  90 tablet  0  . Fish Oil OIL 1 tablet by Does not apply route daily.        . metoprolol (LOPRESSOR) 50 MG tablet Take 1 tablet (50 mg total) by mouth 2 (two) times daily.  180 tablet  0  . nitroGLYCERIN (NITROSTAT) 0.4 MG SL tablet Place 0.4 mg under the tongue every 5 (five) minutes as needed. For chest pain      . ramipril (ALTACE) 2.5 MG capsule Take 1 capsule (2.5 mg total) by mouth 2 (two) times daily.  180 capsule  0   No current facility-administered medications for this visit.    Allergies  Allergen Reactions  . Tetracycline  REACTION: unspecified    History   Social History  . Marital Status: Married    Spouse Name: N/A    Number of Children: 3  . Years of Education: N/A   Occupational History  . 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    Social History Main Topics  . Smoking status: Never Smoker   . Smokeless tobacco: Current User    Types: Chew  . Alcohol Use: 0.5 oz/week    1 drink(s) per week     Comment: occassionally  . Drug Use: No  . Sexual Activity: No   Other Topics Concern  . 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    Family History  Problem Relation Age of Onset  . Prostate cancer Father   . Cancer Father     prostate  . Colon cancer Mother   . Leukemia Mother   . Cancer Mother     colon, leukemia    Review of Systems:  As stated in the HPI and otherwise negative.   BP 134/57   Pulse 73  Ht 5\' 10"  (1.778 m)  Wt 216 lb 12.8 oz (98.34 kg)  BMI 31.11 kg/m2  Physical Examination: General: Well developed, well nourished, NAD HEENT: OP clear, mucus membranes moist SKIN: warm, dry. No rashes. Neuro: No focal deficits Musculoskeletal: Muscle strength 5/5 all ext Psychiatric: Mood and affect normal Neck: No JVD, no carotid bruits, no thyromegaly, no lymphadenopathy. Lungs:Clear bilaterally, no wheezes, rhonci, crackles Cardiovascular: Regular rate and rhythm. No murmurs, gallops or rubs. Abdomen:Soft. Bowel sounds present. Non-tender.  Extremities: No lower extremity edema. Pulses are 2 + in the bilateral DP/PT.  Assessment and Plan:   1. CAD: Stable. He is on good therapy. No changes today. Continue ASA/Plavix/beta blocker/Ace-inh/Vytorin.   2. HTN: BP well controlled. No changes today.   3. HLD: Continue statin. Will check lipids and LFTs today.   4. Carotid artery disease: Mild to moderate by dopplers May 2015. Repeat in one year.   5. PVCs: Continue beta blocker. No awareness of palpitations.

## 2013-09-07 ENCOUNTER — Other Ambulatory Visit: Payer: Self-pay | Admitting: *Deleted

## 2013-09-07 DIAGNOSIS — E7800 Pure hypercholesterolemia, unspecified: Secondary | ICD-10-CM

## 2013-09-07 MED ORDER — ATORVASTATIN CALCIUM 80 MG PO TABS: 80.0000 mg | ORAL_TABLET | Freq: Every day | ORAL | Status: DC

## 2013-10-06 ENCOUNTER — Ambulatory Visit (INDEPENDENT_AMBULATORY_CARE_PROVIDER_SITE_OTHER): Payer: Medicare PPO

## 2013-10-06 DIAGNOSIS — Z23 Encounter for immunization: Secondary | ICD-10-CM

## 2013-10-17 ENCOUNTER — Other Ambulatory Visit: Payer: Self-pay

## 2013-10-17 DIAGNOSIS — E7800 Pure hypercholesterolemia, unspecified: Secondary | ICD-10-CM

## 2013-10-17 MED ORDER — METOPROLOL TARTRATE 50 MG PO TABS: 50.0000 mg | ORAL_TABLET | Freq: Two times a day (BID) | ORAL | Status: DC

## 2013-10-17 MED ORDER — CLOPIDOGREL BISULFATE 75 MG PO TABS
75.0000 mg | ORAL_TABLET | Freq: Every day | ORAL | Status: DC
Start: 2013-10-17 — End: 2014-11-01

## 2013-10-17 MED ORDER — ATORVASTATIN CALCIUM 80 MG PO TABS: 80.0000 mg | ORAL_TABLET | Freq: Every day | ORAL | Status: DC

## 2013-10-17 MED ORDER — RAMIPRIL 2.5 MG PO CAPS: 2.5000 mg | ORAL_CAPSULE | Freq: Two times a day (BID) | ORAL | Status: DC

## 2013-11-09 ENCOUNTER — Ambulatory Visit (INDEPENDENT_AMBULATORY_CARE_PROVIDER_SITE_OTHER): Payer: Medicare PPO | Admitting: Family Medicine

## 2013-11-09 ENCOUNTER — Encounter: Payer: Self-pay | Admitting: Family Medicine

## 2013-11-09 VITALS — BP 120/78 | HR 70 | Temp 98.2°F | Wt 212.5 lb

## 2013-11-09 DIAGNOSIS — J069 Acute upper respiratory infection, unspecified: Secondary | ICD-10-CM

## 2013-11-09 MED ORDER — AMOXICILLIN 875 MG PO TABS: 875.0000 mg | ORAL_TABLET | Freq: Two times a day (BID) | ORAL | Status: DC

## 2013-11-09 NOTE — Patient Instructions (Signed)
Great to see you. Take Amoxicillin as directed- 1 tablet twice daily for 10 days. Ok to continue mucinex for a little longer- no more than a couple of days. Drink plenty of fluids.  Call me with an update.

## 2013-11-09 NOTE — Progress Notes (Signed)
Pre visit review using our clinic review tool, if applicable. No additional management support is needed unless otherwise documented below in the visit note. 

## 2013-11-09 NOTE — Progress Notes (Signed)
SUBJECTIVE:  [name] is a 80 y.o. male who complains of coryza, congestion, sore throat, post nasal drip, night sweats, dry cough, headache and chills for 10 days. He denies a history of anorexia and chest pain and does not a history of asthma. Patient does not smoke cigarettes.   Current Outpatient Prescriptions on File Prior to Visit  Medication Sig Dispense Refill  . aspirin (BAYER CHILDRENS ASPIRIN) 81 MG chewable tablet Chew 81 mg by mouth daily.        Marland Kitchen atorvastatin (LIPITOR) 80 MG tablet Take 1 tablet (80 mg total) by mouth daily.  90 tablet  3  . calcium carbonate (OS-CAL) 600 MG TABS Take 600 mg by mouth daily.        . clopidogrel (PLAVIX) 75 MG tablet Take 1 tablet (75 mg total) by mouth daily.  90 tablet  3  . Fish Oil OIL 1 tablet by Does not apply route daily.        . metoprolol (LOPRESSOR) 50 MG tablet Take 1 tablet (50 mg total) by mouth 2 (two) times daily.  180 tablet  3  . nitroGLYCERIN (NITROSTAT) 0.4 MG SL tablet Place 0.4 mg under the tongue every 5 (five) minutes as needed. For chest pain      . ramipril (ALTACE) 2.5 MG capsule Take 1 capsule (2.5 mg total) by mouth 2 (two) times daily.  180 capsule  3   No current facility-administered medications on file prior to visit.    Allergies  Allergen Reactions  . Tetracycline     REACTION: unspecified    Past Medical History  Diagnosis Date  . Impotence     of organic nature  . Prostate cancer     s/p prostatectomy  . CAD (coronary artery disease)     PTCA 1998 LAD-s/p overlapping DES in LAD in 05/2003  . HTN (hypertension)   . Hyperlipidemia   . History of colonoscopy   . Elevated glucose   . Spinal disease     lumbosacral  . Chest pain 5/5-5/11/2003    Hospital MI, CAD, INCR, CHOL, HTN    Past Surgical History  Procedure Laterality Date  . Pilonidal cystectomy  late 49s  . Angioplasty  1992  . Bone spur right foot      Bilateral  . Prostatectomy    . Laminectomy    . Left shoulder  decompression    . Right carpal tunnel repair  11/18/2007    Dr. Daylene Katayama  . Coronary angioplasty  06/02/2003    PTCA, occl of vessel w/good collaterals    Family History  Problem Relation Age of Onset  . Prostate cancer Father   . Cancer Father     prostate  . Colon cancer Mother   . Leukemia Mother   . Cancer Mother     colon, leukemia    History   Social History  . Marital Status: Married    Spouse Name: N/A    Number of Children: 3  . Years of Education: N/A   Occupational History  . 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    Social History Main Topics  . Smoking status: Never Smoker   . Smokeless tobacco: Current User    Types: Chew  . Alcohol Use: 0.5 oz/week    1 drink(s) per week     Comment: occassionally  . Drug Use: No  . Sexual Activity: No   Other Topics Concern  . 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    OBJECTIVE: BP 120/78  Pulse 70  Temp(Src) 98.2 F (36.8 C) (Oral)  Wt 212 lb 8 oz (96.389 kg)  SpO2 97%  He appears well, vital signs are as noted. Ears normal.  Throat and pharynx normal.  Neck supple. No adenopathy in the neck. Nose is congested. Sinuses non tender. Faint occasional wheezes, right  ASSESSMENT:  sinusitis and bronchitis  PLAN: Given duration and progression of symptoms, will treat for bacterial process with Amoxicillin.  Symptomatic therapy suggested: push fluids, rest and return office visit prn if symptoms persist or worsen. . Call or return to clinic prn if these symptoms worsen or fail to improve as anticipated.

## 2013-11-10 ENCOUNTER — Telehealth: Payer: Self-pay | Admitting: Family Medicine

## 2013-11-10 NOTE — Telephone Encounter (Signed)
error:[id number] ° °

## 2013-11-10 NOTE — Telephone Encounter (Signed)
[facility] EMAILED  °

## 2013-11-29 ENCOUNTER — Other Ambulatory Visit (INDEPENDENT_AMBULATORY_CARE_PROVIDER_SITE_OTHER): Payer: Medicare PPO

## 2013-11-29 DIAGNOSIS — E7800 Pure hypercholesterolemia, unspecified: Secondary | ICD-10-CM

## 2013-11-29 LAB — HEPATIC FUNCTION PANEL
ALK PHOS: 57 U/L (ref 39–117)
ALT: 25 U/L (ref 0–53)
AST: 21 U/L (ref 0–37)
Albumin: 3.5 g/dL (ref 3.5–5.2)
BILIRUBIN DIRECT: 0 mg/dL (ref 0.0–0.3)
BILIRUBIN TOTAL: 0.8 mg/dL (ref 0.2–1.2)
TOTAL PROTEIN: 6.6 g/dL (ref 6.0–8.3)

## 2013-11-29 LAB — LIPID PANEL
CHOLESTEROL: 130 mg/dL (ref 0–200)
HDL: 33.7 mg/dL — ABNORMAL LOW (ref >39.00)
LDL CALC: 68 mg/dL (ref 0–99)
NonHDL: 96.3
TRIGLYCERIDES: 140 mg/dL (ref 0.0–149.0)
Total CHOL/HDL Ratio: 4
VLDL: 28 mg/dL (ref 0.0–40.0)

## 2014-03-06 ENCOUNTER — Ambulatory Visit (INDEPENDENT_AMBULATORY_CARE_PROVIDER_SITE_OTHER): Payer: Medicare PPO | Admitting: Cardiovascular Disease

## 2014-03-06 ENCOUNTER — Encounter: Payer: Self-pay | Admitting: Cardiovascular Disease

## 2014-03-06 VITALS — BP 120/56 | HR 79 | Ht 70.0 in | Wt 219.1 lb

## 2014-03-06 DIAGNOSIS — I739 Peripheral vascular disease, unspecified: Secondary | ICD-10-CM

## 2014-03-06 DIAGNOSIS — I2510 Atherosclerotic heart disease of native coronary artery without angina pectoris: Secondary | ICD-10-CM

## 2014-03-06 DIAGNOSIS — I779 Disorder of arteries and arterioles, unspecified: Secondary | ICD-10-CM

## 2014-03-06 DIAGNOSIS — E785 Hyperlipidemia, unspecified: Secondary | ICD-10-CM

## 2014-03-06 DIAGNOSIS — I10 Essential (primary) hypertension: Secondary | ICD-10-CM

## 2014-03-06 NOTE — Patient Instructions (Addendum)
Your physician wants you to follow-up in: 12 months. You will receive a reminder letter in the mail two months in advance. If you don't receive a letter, please call our office to schedule the follow-up appointment.   Your physician has requested that you have a carotid duplex. This test is an ultrasound of the carotid arteries in your neck. It looks at blood flow through these arteries that supply the brain with blood. Allow one hour for this exam. There are no restrictions or special instructions. To be done after Jun 17, 2014

## 2014-03-06 NOTE — Progress Notes (Signed)
History of Present Illness: 81 yo WM with h/o CAD, HTN, HLD, PVCs here today for cardiac follow up. He has been followed in the past by Dr. Olevia Perches. In 1998 he had PTCA of the LAD. In 2005 he had a drug-eluting stent placed in the LAD and then a short time later had a second drug-eluting stent placed in the LAD overlapping the first for an edge tear. 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 Carotid dopplers May 2015 with 40-59% RICA stenosis, 4-97% LICA stenosis.   He is here today for follow up. He has had no recent chest pain, shortness of breath or palpitations. He continues to have shoulder pain. This is felt to be orthopedic related. He has been chopping wood and working on his farm without any trouble.    Primary Care Physician: Arnette Norris  Last Lipid Profile:Lipid Panel     Component Value Date/Time   CHOL 130 11/29/2013 0759   TRIG 140.0 11/29/2013 0759   HDL 33.70* 11/29/2013 0759   CHOLHDL 4 11/29/2013 0759   VLDL 28.0 11/29/2013 0759   LDLCALC 68 11/29/2013 0759    Past Medical History  Diagnosis Date  . Impotence     of organic nature  . Prostate cancer     s/p prostatectomy  . CAD (coronary artery disease)     PTCA 1998 LAD-s/p overlapping DES in LAD in 05/2003  . HTN (hypertension)   . Hyperlipidemia   . History of colonoscopy   . Elevated glucose   . Spinal disease     lumbosacral  . Chest pain 5/5-5/11/2003    Hospital MI, CAD, INCR, CHOL, HTN    Past Surgical History  Procedure Laterality Date  . Pilonidal cystectomy  late 68s  . Angioplasty  1992  . Bone spur right foot      Bilateral  . Prostatectomy    . Laminectomy    . Left shoulder decompression    . Right carpal tunnel repair  11/18/2007    Dr. Daylene Katayama  . Coronary angioplasty  06/02/2003    PTCA, occl of vessel w/good collaterals    Current Outpatient Prescriptions  Medication Sig Dispense Refill  . amoxicillin (AMOXIL) 875  MG tablet Take 1 tablet (875 mg total) by mouth 2 (two) times daily. 20 tablet 0  . aspirin (BAYER CHILDRENS ASPIRIN) 81 MG chewable tablet Chew 81 mg by mouth daily.      Marland Kitchen atorvastatin (LIPITOR) 80 MG tablet Take 1 tablet (80 mg total) by mouth daily. 90 tablet 3  . calcium carbonate (OS-CAL) 600 MG TABS Take 600 mg by mouth daily.      . clopidogrel (PLAVIX) 75 MG tablet Take 1 tablet (75 mg total) by mouth daily. 90 tablet 3  . Fish Oil OIL 1 tablet by Does not apply route daily.      . metoprolol (LOPRESSOR) 50 MG tablet Take 1 tablet (50 mg total) by mouth 2 (two) times daily. 180 tablet 3  . nitroGLYCERIN (NITROSTAT) 0.4 MG SL tablet Place 0.4 mg under the tongue every 5 (five) minutes as needed. For chest pain    . ramipril (ALTACE) 2.5 MG capsule Take 1 capsule (2.5 mg total) by mouth 2 (two) times daily. 180 capsule 3   No current facility-administered medications for this visit.    Allergies  Allergen Reactions  . Tetracycline     REACTION: unspecified  History   Social History  . Marital Status: Married    Spouse Name: N/A    Number of Children: 3  . Years of Education: N/A   Occupational History  . 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    Social History Main Topics  . Smoking status: Never Smoker   . Smokeless tobacco: Current User    Types: Chew  . Alcohol Use: 0.5 oz/week    1 drink(s) per week     Comment: occassionally  . Drug Use: No  . Sexual Activity: No   Other Topics Concern  . 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    Family History  Problem Relation Age of Onset  . Prostate cancer Father   . Cancer Father     prostate  . Colon cancer Mother   . Leukemia Mother   . Cancer Mother     colon, leukemia    Review of Systems:  As stated in the HPI and otherwise  negative.   BP 120/56 mmHg  Pulse 79  Ht 5\' 10"  (1.778 m)  Wt 219 lb 1.9 oz (99.392 kg)  BMI 31.44 kg/m2  SpO2 96%  Physical Examination: General: Well developed, well nourished, NAD HEENT: OP clear, mucus membranes moist SKIN: warm, dry. No rashes. Neuro: No focal deficits Musculoskeletal: Muscle strength 5/5 all ext Psychiatric: Mood and affect normal Neck: No JVD, no carotid bruits, no thyromegaly, no lymphadenopathy. Lungs:Clear bilaterally, no wheezes, rhonci, crackles Cardiovascular: Regular rate and rhythm. No murmurs, gallops or rubs. Abdomen:Soft. Bowel sounds present. Non-tender.  Extremities: No lower extremity edema. Pulses are 2 + in the bilateral DP/PT.  Assessment and Plan:   1. CAD: Stable. He is on good therapy. Continue ASA/Plavix/beta blocker/Ace-inh/Vytorin.   2. HTN: BP well controlled. No changes today.   3. HLD: Continue statin. Lipids well controlled.   4. Carotid artery disease: Mild to moderate by dopplers May 2015. Repeat May 2016.   5. PVCs: Continue beta blocker. No awareness of palpitations.

## 2014-05-31 IMAGING — CR DG LUMBAR SPINE COMPLETE 4+V
5 series · 5 of 5 positions shown · non-contrast
Comparison: 03/18/2007 MRI

CLINICAL DATA: Lower back pain

LUMBAR SPINE - COMPLETE 4+ VIEW

[t lumbar spine ap]
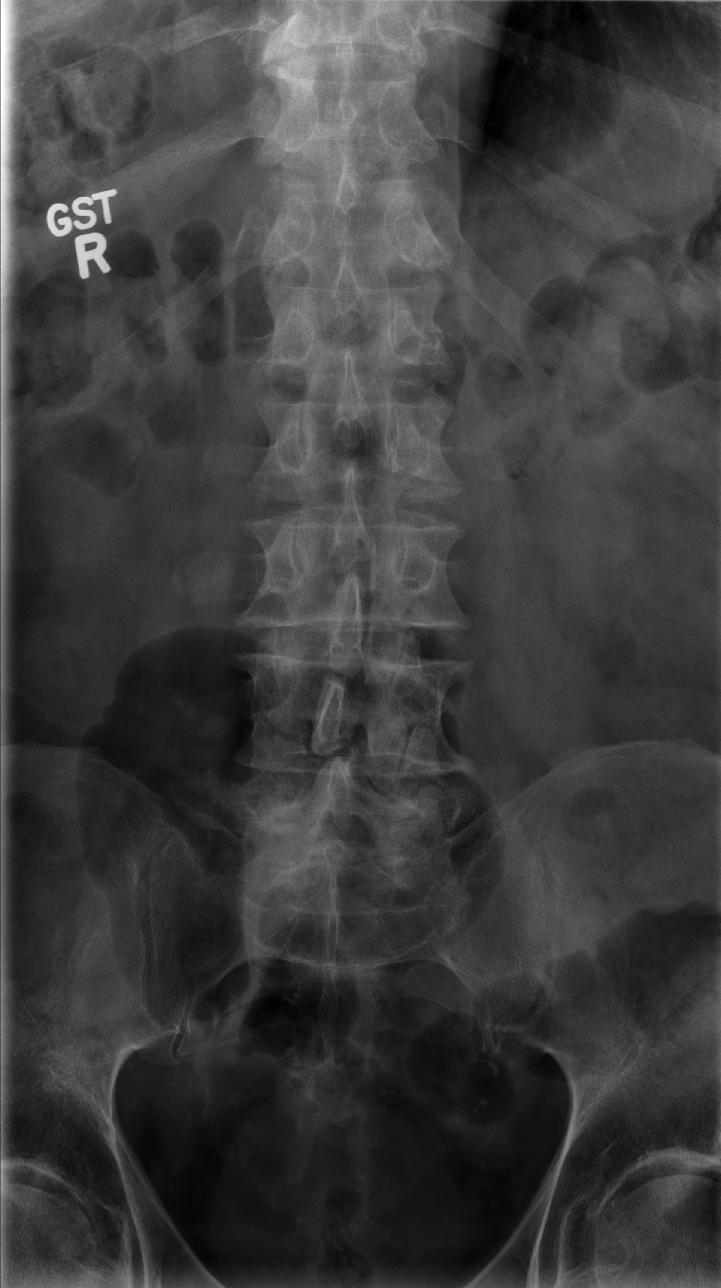

[t lumbar spine obl (1 of 2)]
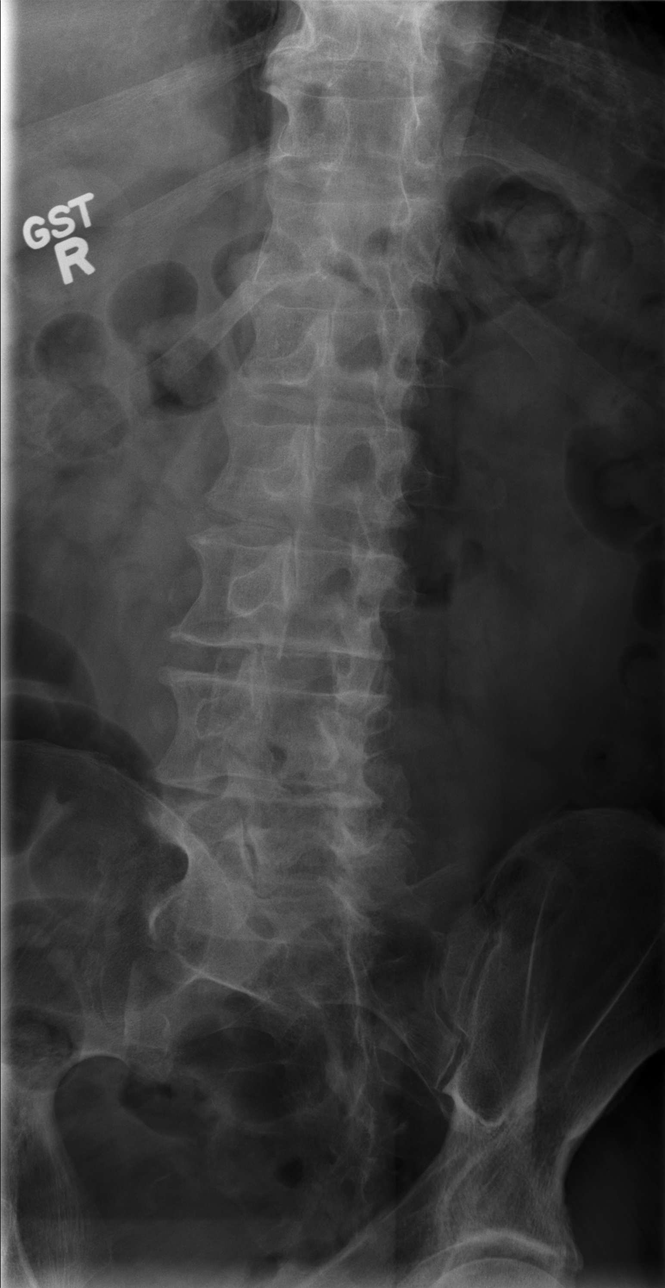

[t lumbar spine obl (2 of 2)]
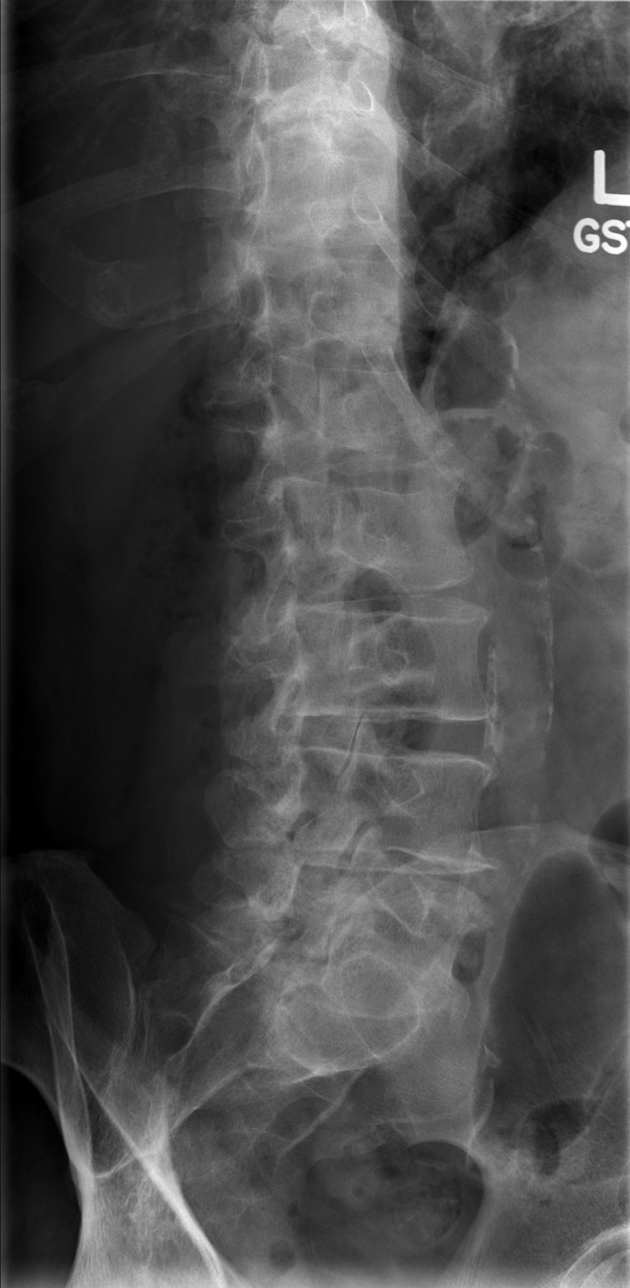

[t lumbar spine lat]
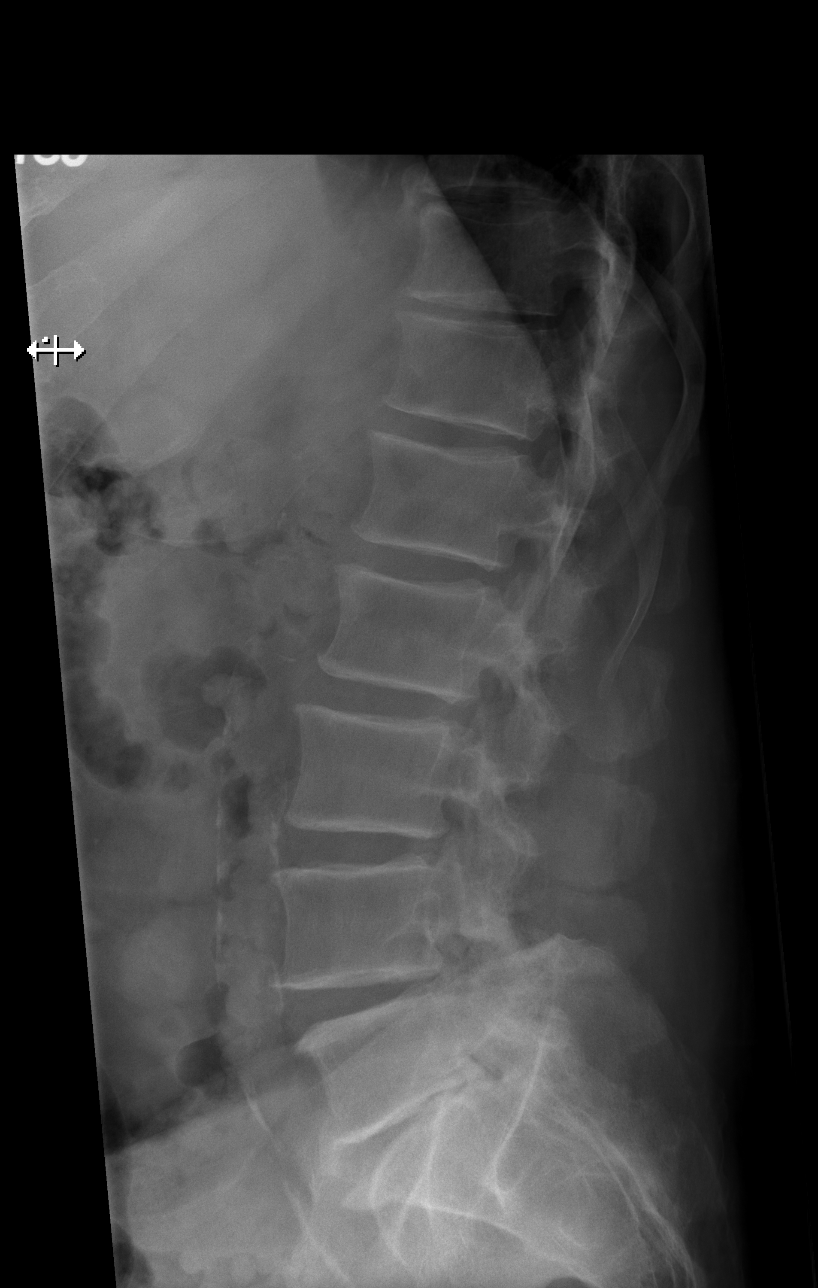

[t lumbar l-5 s-1 spot]
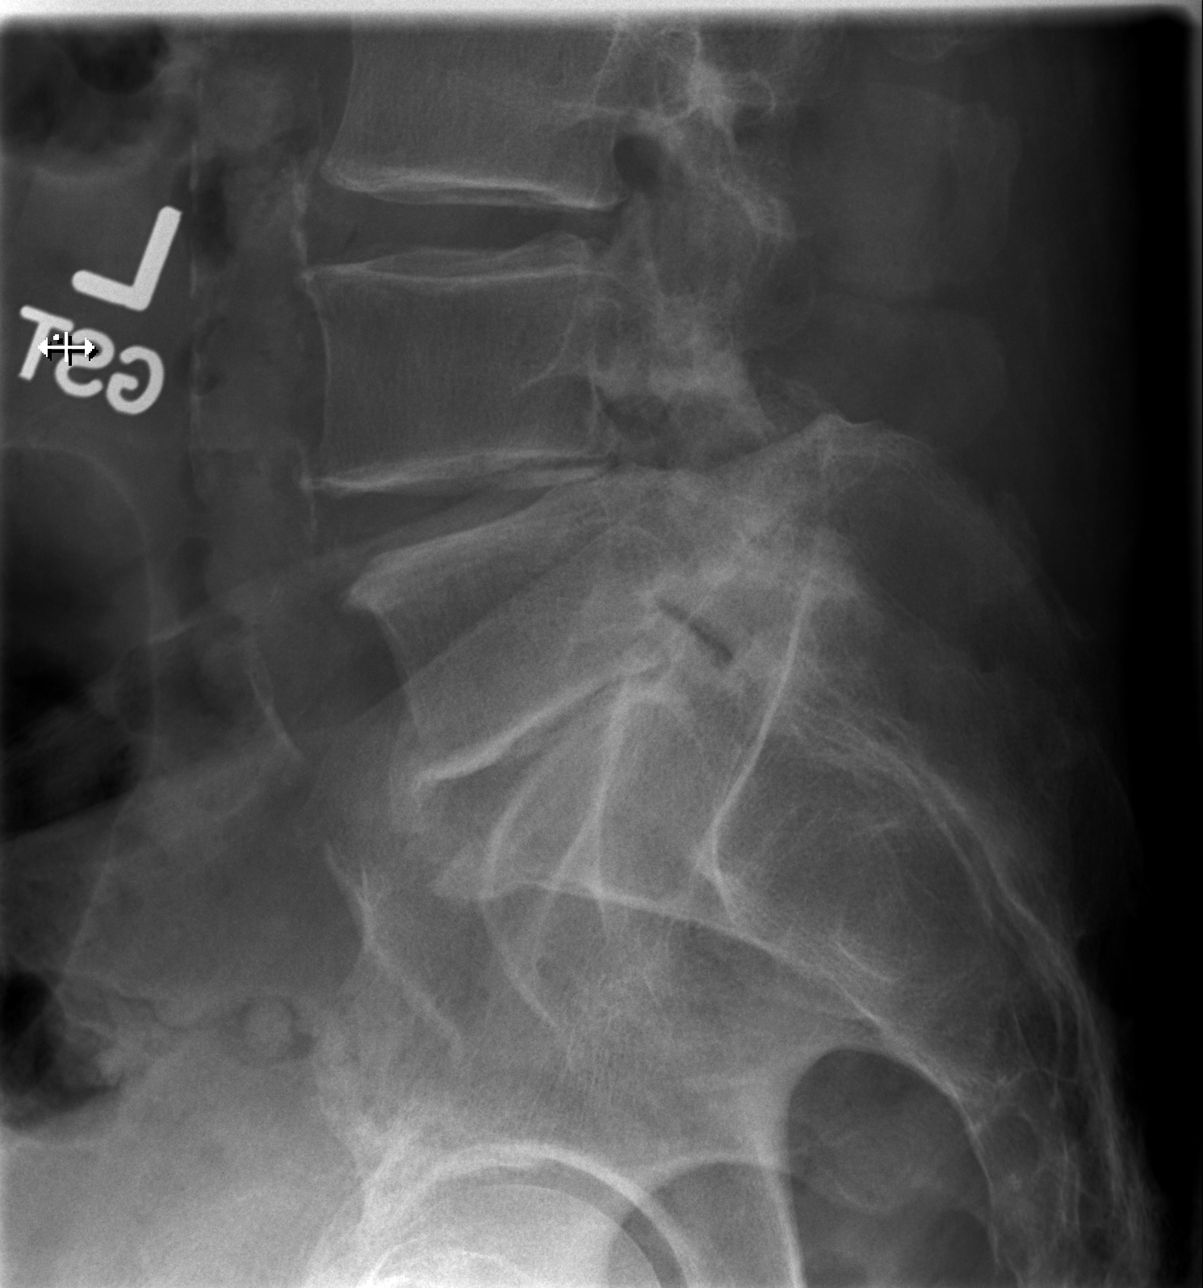

[5 of 5 positions shown; findings below may reference images not displayed]

FINDINGS: L5 S1 degenerative disc disease.  Multilevel facet
arthropathy.  Mild multilevel degenerative change and anterior
osteophyte formation.  T11-12 degenerative disc disease with height
loss.  No acute fracture or dislocation identified.  No aggressive
osseous lesion.  Aortic atherosclerosis.  Overlying soft tissues
otherwise within normal range.  Bowel gas overlies the sacrum.  SI
joints grossly intact.
IMPRESSION: Multilevel degenerative changes.  No acute osseous abnormality
identified of the lumbar spine.

## 2014-06-14 ENCOUNTER — Ambulatory Visit (INDEPENDENT_AMBULATORY_CARE_PROVIDER_SITE_OTHER)
Admission: RE | Admit: 2014-06-14 | Discharge: 2014-06-14 | Disposition: A | Discharge status: Home / Self Care | Payer: Medicare PPO | Source: Ambulatory Visit | Attending: Family Medicine | Admitting: Family Medicine

## 2014-06-14 ENCOUNTER — Encounter: Payer: Self-pay | Admitting: Family Medicine

## 2014-06-14 ENCOUNTER — Ambulatory Visit (INDEPENDENT_AMBULATORY_CARE_PROVIDER_SITE_OTHER): Payer: Medicare PPO | Admitting: Family Medicine

## 2014-06-14 VITALS — BP 140/70 | HR 69 | Temp 98.2°F | Wt 213.0 lb

## 2014-06-14 DIAGNOSIS — M5430 Sciatica, unspecified side: Secondary | ICD-10-CM

## 2014-06-14 DIAGNOSIS — M5440 Lumbago with sciatica, unspecified side: Secondary | ICD-10-CM

## 2014-06-14 DIAGNOSIS — M545 Low back pain: Secondary | ICD-10-CM | POA: Diagnosis not present

## 2014-06-14 DIAGNOSIS — M5431 Sciatica, right side: Secondary | ICD-10-CM | POA: Insufficient documentation

## 2014-06-14 DIAGNOSIS — I779 Disorder of arteries and arterioles, unspecified: Secondary | ICD-10-CM | POA: Diagnosis not present

## 2014-06-14 DIAGNOSIS — I739 Peripheral vascular disease, unspecified: Secondary | ICD-10-CM

## 2014-06-14 DIAGNOSIS — M5137 Other intervertebral disc degeneration, lumbosacral region: Secondary | ICD-10-CM | POA: Diagnosis not present

## 2014-06-14 LAB — BASIC METABOLIC PANEL
BUN: 17 mg/dL (ref 6–23)
CALCIUM: 9.4 mg/dL (ref 8.4–10.5)
CO2: 29 meq/L (ref 19–32)
CREATININE: 0.87 mg/dL (ref 0.40–1.50)
Chloride: 105 mEq/L (ref 96–112)
GFR: 89.84 mL/min (ref >60.00)
GLUCOSE: 128 mg/dL — AB (ref 70–99)
Potassium: 4.7 mEq/L (ref 3.5–5.1)
SODIUM: 138 meq/L (ref 135–145)

## 2014-06-14 NOTE — Addendum Note (Signed)
Addended by: Marchia Bond on: 06/14/2014 09:21 AM   Modules accepted: Orders

## 2014-06-14 NOTE — Progress Notes (Signed)
Subjective:   Patient ID: [name], male    DOB: 28-May-1932, 81 y.o.   MRN: [id number]  [name] is a pleasant 81 y.o. year old male who presents to clinic today with Back Pain  on 06/14/2014  HPI:  Past few months, worsening right back/hip pain with radiation of pain down right leg. Very similar symptoms to pain he had prior to his laminectomy in 2009.  No known injury.  Pain is worse when he is riding in the car for long periods of time or even riding on his lawn mower.  Alleve helps some but only for short period of time.  Contacted his neurosurgeon, Dr. Carloyn Manner, who told him he had to come see me to order an MRI.  Current Outpatient Prescriptions on File Prior to Visit  Medication Sig Dispense Refill  . aspirin (BAYER CHILDRENS ASPIRIN) 81 MG chewable tablet Chew 81 mg by mouth daily.      Marland Kitchen atorvastatin (LIPITOR) 80 MG tablet Take 1 tablet (80 mg total) by mouth daily. 90 tablet 3  . calcium carbonate (OS-CAL) 600 MG TABS Take 600 mg by mouth daily.      . clopidogrel (PLAVIX) 75 MG tablet Take 1 tablet (75 mg total) by mouth daily. 90 tablet 3  . Fish Oil OIL 1 tablet by Does not apply route daily.      . metoprolol (LOPRESSOR) 50 MG tablet Take 1 tablet (50 mg total) by mouth 2 (two) times daily. 180 tablet 3  . nitroGLYCERIN (NITROSTAT) 0.4 MG SL tablet Place 0.4 mg under the tongue every 5 (five) minutes as needed. For chest pain    . ramipril (ALTACE) 2.5 MG capsule Take 1 capsule (2.5 mg total) by mouth 2 (two) times daily. 180 capsule 3   No current facility-administered medications on file prior to visit.    Allergies  Allergen Reactions  . Tetracycline     REACTION: unspecified    Past Medical History  Diagnosis Date  . Impotence     of organic nature  . Prostate cancer     s/p prostatectomy  . CAD (coronary artery disease)     PTCA 1998 LAD-s/p overlapping DES in LAD in 05/2003  . HTN (hypertension)   . Hyperlipidemia   . History of colonoscopy   .  Elevated glucose   . Spinal disease     lumbosacral  . Chest pain 5/5-5/11/2003    Hospital MI, CAD, INCR, CHOL, HTN    Past Surgical History  Procedure Laterality Date  . Pilonidal cystectomy  late 64s  . Angioplasty  1992  . Bone spur right foot      Bilateral  . Prostatectomy    . Laminectomy    . Left shoulder decompression    . Right carpal tunnel repair  11/18/2007    Dr. Daylene Katayama  . Coronary angioplasty  06/02/2003    PTCA, occl of vessel w/good collaterals    Family History  Problem Relation Age of Onset  . Prostate cancer Father   . Cancer Father     prostate  . Colon cancer Mother   . Leukemia Mother   . Cancer Mother     colon, leukemia    History   Social History  . Marital Status: Married    Spouse Name: N/A  . Number of Children: 3  . Years of Education: N/A   Occupational History  . 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    Social History Main  Topics  . Smoking status: Never Smoker   . Smokeless tobacco: Current User    Types: Chew  . Alcohol Use: 0.5 oz/week    1 drink(s) per week     Comment: occassionally  . Drug Use: No  . Sexual Activity: No   Other Topics Concern  . 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   The PMH, PSH, Social History, Family History, Medications, and allergies have been reviewed in [facility], and have been updated if relevant.      Review of Systems  Constitutional: Negative.   Respiratory: Negative.   Cardiovascular: Negative.   Gastrointestinal: Negative.   Genitourinary: Negative.   Musculoskeletal: Positive for back pain. Negative for neck pain and neck stiffness.  Neurological: Negative.   Hematological: Negative.        Objective:    BP 140/70 mmHg  Pulse 69  Temp(Src) 98.2 F (36.8 C) (Tympanic)  Wt 213 lb (96.616 kg)   SpO2 98%   Physical Exam  Constitutional: He is oriented to person, place, and time. He appears well-developed and well-nourished. No distress.  HENT:  Head: Normocephalic.  Eyes: Conjunctivae are normal.  Cardiovascular: Normal rate.   Pulmonary/Chest: Effort normal.  Abdominal: Soft.  Musculoskeletal:       Lumbar back: He exhibits bony tenderness. He exhibits no edema, no deformity and no spasm.  +SLR, + faber right Neg SLR, neg faber left  Neurological: He is alert and oriented to person, place, and time. No cranial nerve deficit. Coordination normal.  Skin: Skin is warm and dry.  Psychiatric: He has a normal mood and affect. His behavior is normal. Judgment and thought content normal.  Vitals reviewed.         Assessment & Plan:   Back pain of lumbar region with sciatica - Plan: DG Lumbar Spine Complete, Basic metabolic panel, MR Lumbar Spine W Wo Contrast  DEGENERATIVE DISC DISEASE, LUMBAR SPINE No Follow-up on file.

## 2014-06-14 NOTE — Progress Notes (Signed)
Pre visit review using our clinic review tool, if applicable. No additional management support is needed unless otherwise documented below in the visit note. 

## 2014-06-14 NOTE — Assessment & Plan Note (Signed)
New and similar to symptoms prior to laminectomy. Known significant DDD of lumbar spine. Will proceed with MRI (order lumbar xray first). He is declining rx for steroids at this time.  The patient indicates understanding of these issues and agrees with the plan.

## 2014-06-14 NOTE — Patient Instructions (Signed)
Great to see you. I will call you with your xray results.  Someone will call you with your MRI appointment in the next day or two.

## 2014-06-19 ENCOUNTER — Other Ambulatory Visit: Payer: Self-pay | Admitting: Cardiovascular Disease

## 2014-06-19 ENCOUNTER — Ambulatory Visit (HOSPITAL_COMMUNITY): Payer: Medicare PPO | Attending: Cardiovascular Disease

## 2014-06-19 DIAGNOSIS — I6523 Occlusion and stenosis of bilateral carotid arteries: Secondary | ICD-10-CM

## 2014-06-19 DIAGNOSIS — I779 Disorder of arteries and arterioles, unspecified: Secondary | ICD-10-CM | POA: Diagnosis present

## 2014-06-29 ENCOUNTER — Ambulatory Visit
Admission: RE | Admit: 2014-06-29 | Discharge: 2014-06-29 | Disposition: A | Discharge status: Home / Self Care | Payer: Medicare PPO | Source: Ambulatory Visit | Attending: Family Medicine | Admitting: Family Medicine

## 2014-06-29 DIAGNOSIS — M5440 Lumbago with sciatica, unspecified side: Secondary | ICD-10-CM

## 2014-06-29 MED ORDER — GADOBENATE DIMEGLUMINE 529 MG/ML IV SOLN
20.0000 mL | Freq: Once | INTRAVENOUS | Status: AC | PRN
  Administered 2014-06-29: 20 mL via INTRAVENOUS

## 2014-06-30 ENCOUNTER — Telehealth: Payer: Self-pay | Admitting: Family Medicine

## 2014-06-30 DIAGNOSIS — M5440 Lumbago with sciatica, unspecified side: Secondary | ICD-10-CM

## 2014-06-30 NOTE — Telephone Encounter (Signed)
Pt needs referral to neurosurgeon Dr. Glenna Fellows placed in [facility].  He has the CD from recent MRI to take to his appointment.

## 2014-06-30 NOTE — Telephone Encounter (Signed)
Referral placed.

## 2014-08-16 ENCOUNTER — Telehealth: Payer: Self-pay | Admitting: Family Medicine

## 2014-08-16 NOTE — Telephone Encounter (Signed)
Filling out paperwork for patient to see Dr Vertell Limber at [facility], patients wife aware.

## 2014-08-16 NOTE — Telephone Encounter (Signed)
Spouse called wanting to see if you can get mr Duggin into see dr Vertell Limber in [place]. They have not heard anything from dr Carloyn Manner office  Mr Ostlund went to dr Estanislado Emms office this morning and got his results of his mri and records from 9 years ago

## 2014-09-19 DIAGNOSIS — Z6829 Body mass index (BMI) 29.0-29.9, adult: Secondary | ICD-10-CM | POA: Diagnosis not present

## 2014-09-19 DIAGNOSIS — I10 Essential (primary) hypertension: Secondary | ICD-10-CM | POA: Diagnosis not present

## 2014-09-19 DIAGNOSIS — M5416 Radiculopathy, lumbar region: Secondary | ICD-10-CM | POA: Diagnosis not present

## 2014-10-06 ENCOUNTER — Telehealth: Payer: Self-pay | Admitting: Family Medicine

## 2014-10-06 ENCOUNTER — Encounter (HOSPITAL_COMMUNITY): Payer: Self-pay | Admitting: Emergency Medicine

## 2014-10-06 ENCOUNTER — Emergency Department (HOSPITAL_COMMUNITY)
Admission: EM | Admit: 2014-10-06 | Discharge: 2014-10-06 | Disposition: A | Discharge status: Home / Self Care | Payer: Medicare PPO | Attending: Emergency Medicine | Admitting: Emergency Medicine

## 2014-10-06 DIAGNOSIS — Z7982 Long term (current) use of aspirin: Secondary | ICD-10-CM | POA: Insufficient documentation

## 2014-10-06 DIAGNOSIS — E785 Hyperlipidemia, unspecified: Secondary | ICD-10-CM | POA: Insufficient documentation

## 2014-10-06 DIAGNOSIS — Y998 Other external cause status: Secondary | ICD-10-CM | POA: Diagnosis not present

## 2014-10-06 DIAGNOSIS — I2510 Atherosclerotic heart disease of native coronary artery without angina pectoris: Secondary | ICD-10-CM | POA: Diagnosis not present

## 2014-10-06 DIAGNOSIS — S61412A Laceration without foreign body of left hand, initial encounter: Secondary | ICD-10-CM | POA: Diagnosis not present

## 2014-10-06 DIAGNOSIS — Z9189 Other specified personal risk factors, not elsewhere classified: Secondary | ICD-10-CM

## 2014-10-06 DIAGNOSIS — S60512A Abrasion of left hand, initial encounter: Secondary | ICD-10-CM | POA: Diagnosis not present

## 2014-10-06 DIAGNOSIS — Z79899 Other long term (current) drug therapy: Secondary | ICD-10-CM | POA: Insufficient documentation

## 2014-10-06 DIAGNOSIS — Y9389 Activity, other specified: Secondary | ICD-10-CM | POA: Insufficient documentation

## 2014-10-06 DIAGNOSIS — Y9289 Other specified places as the place of occurrence of the external cause: Secondary | ICD-10-CM | POA: Insufficient documentation

## 2014-10-06 DIAGNOSIS — Z23 Encounter for immunization: Secondary | ICD-10-CM | POA: Insufficient documentation

## 2014-10-06 DIAGNOSIS — Z8546 Personal history of malignant neoplasm of prostate: Secondary | ICD-10-CM | POA: Diagnosis not present

## 2014-10-06 DIAGNOSIS — Z8739 Personal history of other diseases of the musculoskeletal system and connective tissue: Secondary | ICD-10-CM | POA: Insufficient documentation

## 2014-10-06 DIAGNOSIS — Z7902 Long term (current) use of antithrombotics/antiplatelets: Secondary | ICD-10-CM | POA: Diagnosis not present

## 2014-10-06 DIAGNOSIS — Y288XXA Contact with other sharp object, undetermined intent, initial encounter: Secondary | ICD-10-CM | POA: Insufficient documentation

## 2014-10-06 DIAGNOSIS — Z7901 Long term (current) use of anticoagulants: Secondary | ICD-10-CM

## 2014-10-06 DIAGNOSIS — I10 Essential (primary) hypertension: Secondary | ICD-10-CM | POA: Diagnosis not present

## 2014-10-06 DIAGNOSIS — S6992XA Unspecified injury of left wrist, hand and finger(s), initial encounter: Secondary | ICD-10-CM | POA: Diagnosis present

## 2014-10-06 LAB — I-STAT CHEM 8, ED
BUN: 9 mg/dL (ref 6–20)
CREATININE: 0.8 mg/dL (ref 0.61–1.24)
Calcium, Ion: 1.25 mmol/L (ref 1.13–1.30)
Chloride: 103 mmol/L (ref 101–111)
Glucose, Bld: 196 mg/dL — ABNORMAL HIGH (ref 65–99)
HEMATOCRIT: 40 % (ref 39.0–52.0)
HEMOGLOBIN: 13.6 g/dL (ref 13.0–17.0)
Potassium: 4.3 mmol/L (ref 3.5–5.1)
Sodium: 140 mmol/L (ref 135–145)
TCO2: 25 mmol/L (ref 0–100)

## 2014-10-06 MED ORDER — LIDOCAINE-EPINEPHRINE (PF) 2 %-1:200000 IJ SOLN
10.0000 mL | Freq: Once | INTRAMUSCULAR | Status: DC
  Filled 2014-10-06: qty 20

## 2014-10-06 MED ORDER — SILVER NITRATE-POT NITRATE 75-25 % EX MISC
1.0000 | Freq: Once | CUTANEOUS | Status: AC
Start: 2014-10-06 — End: 2014-10-06
  Administered 2014-10-06: 1 via TOPICAL

## 2014-10-06 MED ORDER — TETANUS-DIPHTH-ACELL PERTUSSIS 5-2.5-18.5 LF-MCG/0.5 IM SUSP
0.5000 mL | Freq: Once | INTRAMUSCULAR | Status: AC
  Administered 2014-10-06: 0.5 mL via INTRAMUSCULAR
  Filled 2014-10-06: qty 0.5

## 2014-10-06 NOTE — Telephone Encounter (Signed)
Patient Name: [name]  DOB: 06-Mar-1932    Initial Comment caller states her husband cut his hand 3 days ago - it is still bleeding - he is on a blood thinner   Nurse Assessment  Nurse: Vallery Sa, RN, [place] Date/Time (Eastern Time): 10/06/2014 8:19:26 AM  Confirm and document reason for call. If symptomatic, describe symptoms. ---Caller states her husband cut his right hand on a gutter three days ago and continues to have bleeding. No fever.  Has the patient traveled out of the country within the last 30 days? ---No  Does the patient require triage? ---Yes  Related visit to physician within the last 2 weeks? ---Yes  Does the PT have any chronic conditions? (i.e. diabetes, asthma, etc.) ---Yes  List chronic conditions. ---Takes blood thinners for cardiac stents     Guidelines    Guideline Title Affirmed Question Affirmed Notes  Cuts and Lacerations [1] Bleeding AND [2] won't stop after 10 minutes of direct pressure (using correct technique)    Final Disposition User   Go to ED Now Vallery Sa, RN, [facility] - ED   Disagree/Comply: Comply

## 2014-10-06 NOTE — ED Provider Notes (Signed)
CSN: [id number]     Arrival date & time 10/06/14  0849 History   First MD Initiated Contact with Patient 10/06/14 [phone]     Chief Complaint  Patient presents with  . Extremity Laceration     (Consider location/radiation/quality/duration/timing/severity/associated sxs/prior Treatment) Patient is a 81 y.o. male presenting with hand injury. The history is provided by the patient.  Hand Injury Location:  Hand Time since incident:  2 days Injury: yes   Mechanism of injury comment:  Abrasion on gutter Hand location:  Dorsum of L hand Pain details:    Quality:  Aching   Radiates to:  Does not radiate   Severity:  Mild   Onset quality:  Gradual   Timing:  Constant   Progression:  Unchanged Chronicity:  New Foreign body present:  No foreign bodies Tetanus status: 9 years ago, updated. Prior injury to area:  No Relieved by:  Nothing Worsened by:  Nothing tried Ineffective treatments:  None tried Associated symptoms: no decreased range of motion, no fever, no numbness and no swelling   Associated symptoms comment:  Slow bleeding   Past Medical History  Diagnosis Date  . Impotence     of organic nature  . Prostate cancer     s/p prostatectomy  . CAD (coronary artery disease)     PTCA 1998 LAD-s/p overlapping DES in LAD in 05/2003  . HTN (hypertension)   . Hyperlipidemia   . History of colonoscopy   . Elevated glucose   . Spinal disease     lumbosacral  . Chest pain 5/5-5/11/2003    Hospital MI, CAD, INCR, CHOL, HTN   Past Surgical History  Procedure Laterality Date  . Pilonidal cystectomy  late 64s  . Angioplasty  1992  . Bone spur right foot      Bilateral  . Prostatectomy    . Laminectomy    . Left shoulder decompression    . Right carpal tunnel repair  11/18/2007    Dr. Daylene Katayama  . Coronary angioplasty  06/02/2003    PTCA, occl of vessel w/good collaterals   Family History  Problem Relation Age of Onset  . Prostate cancer Father   . Cancer Father     prostate    . Colon cancer Mother   . Leukemia Mother   . Cancer Mother     colon, leukemia   Social History  Substance Use Topics  . Smoking status: Never Smoker   . Smokeless tobacco: Current User    Types: Chew  . Alcohol Use: 0.5 oz/week    1 drink(s) per week     Comment: occassionally    Review of Systems  Constitutional: Negative for fever.  All other systems reviewed and are negative.     Allergies  Tetracycline  Home Medications   Prior to Admission medications   Medication Sig Start Date End Date Taking? Authorizing Provider  aspirin (BAYER CHILDRENS ASPIRIN) 81 MG chewable tablet Chew 81 mg by mouth daily.      Historical Provider, MD  atorvastatin (LIPITOR) 80 MG tablet Take 1 tablet (80 mg total) by mouth daily. 10/17/13   Burnell Blanks, MD  calcium carbonate (OS-CAL) 600 MG TABS Take 600 mg by mouth daily.      Historical Provider, MD  clopidogrel (PLAVIX) 75 MG tablet Take 1 tablet (75 mg total) by mouth daily. 10/17/13   Burnell Blanks, MD  Fish Oil OIL 1 tablet by Does not apply route daily.  Historical Provider, MD  metoprolol (LOPRESSOR) 50 MG tablet Take 1 tablet (50 mg total) by mouth 2 (two) times daily. 10/17/13   Burnell Blanks, MD  nitroGLYCERIN (NITROSTAT) 0.4 MG SL tablet Place 0.4 mg under the tongue every 5 (five) minutes as needed. For chest pain 09/13/10   Burnell Blanks, MD  ramipril (ALTACE) 2.5 MG capsule Take 1 capsule (2.5 mg total) by mouth 2 (two) times daily. 10/17/13   Burnell Blanks, MD   BP 179/77 mmHg  Pulse 78  Temp(Src) 97.8 F (36.6 C) (Oral)  Resp 17  Ht 5\' 11"  (1.803 m)  Wt 212 lb (96.163 kg)  BMI 29.58 kg/m2  SpO2 97% Physical Exam  Constitutional: He is oriented to person, place, and time. He appears well-developed and well-nourished. No distress.  HENT:  Head: Normocephalic and atraumatic.  Eyes: Conjunctivae are normal.  Neck: Neck supple. No tracheal deviation present.   Cardiovascular: Normal rate and regular rhythm.   Pulmonary/Chest: Effort normal. No respiratory distress.  Abdominal: Soft. He exhibits no distension.  Musculoskeletal:       Left hand: He exhibits laceration (abrasion over dorsal hand, oozing dark red blood). He exhibits normal range of motion, no deformity and no swelling.       Hands: Neurological: He is alert and oriented to person, place, and time.  Skin: Skin is warm and dry.  Psychiatric: He has a normal mood and affect.    ED Course  Cauterization Date/Time: 10/06/2014 10:00 AM Performed by: Leo Grosser Authorized by: Leo Grosser Consent: Verbal consent obtained. Risks and benefits: risks, benefits and alternatives were discussed Consent given by: patient Patient understanding: patient states understanding of the procedure being performed Required items: required blood products, implants, devices, and special equipment available Patient identity confirmed: verbally with patient, arm band, provided demographic data and hospital-assigned identification number Time out: Immediately prior to procedure a "time out" was called to verify the correct patient, procedure, equipment, support staff and site/side marked as required. Local anesthesia used: no Patient sedated: no Patient tolerance: Patient tolerated the procedure well with no immediate complications   (including critical care time) Labs Review Labs Reviewed  I-STAT CHEM 8, ED - Abnormal; Notable for the following:    Glucose, Bld 196 (*)    All other components within normal limits    Imaging Review No results found. I have personally reviewed and evaluated these images and lab results as part of my medical decision-making.   EKG Interpretation None      MDM   Final diagnoses:  Hand abrasion, left, initial encounter  Bleeding risk due to aspirin  On clopidogrel therapy   81 y.o. male on ASA and plavix therapy for CAD s/p stenting presents with left  dorsal hand bleeding following small cut related to cleaning gutters 2 days ago. Unable to stop with direct pressure. Cauterized with silver nitrate with good hemostasis. Wound care discussed and all questions answered. No evidence of anemia 2/2 low volume bleed. Plan to follow up with PCP as needed and return precautions discussed for worsening or new concerning symptoms.     Leo Grosser, MD 10/07/14 1425

## 2014-10-06 NOTE — Discharge Instructions (Signed)

## 2014-10-06 NOTE — ED Notes (Signed)
Cut hand on gutter 3 days ago.  Bleeding was under control until patient changed bandage yesterday.  On plavix, states he did not take any medications yesterday including plavix or any of his BP meds.  MD at bedside.  Scant bleeding from pea sized cut on dorsal side of hand.

## 2014-10-06 NOTE — Telephone Encounter (Signed)
From encounter note pt seen  10/06/14.

## 2014-10-06 NOTE — ED Notes (Signed)
Vital signs stable. 

## 2014-10-06 NOTE — Telephone Encounter (Signed)
PLEASE NOTE: All timestamps contained within this report are represented as [place] Standard Time. CONFIDENTIALTY NOTICE: This fax transmission is intended only for the addressee. It contains information that is legally privileged, confidential or otherwise protected from use or disclosure. If you are not the intended recipient, you are strictly prohibited from reviewing, disclosing, copying using or disseminating any of this information or taking any action in reliance on or regarding this information. If you have received this fax in error, please notify us immediately by telephone so that we can arrange for its return to [place]. Phone: [phone], Toll-Free: [phone], Fax: [phone] Page: 1 of 2 Call Id: [id number] [place] Patient Name: [name] Gender: Male DOB: 1932/02/26 Age: 81 Y 10 M 13 D Return Phone Number: [phone] (Primary) Address: City/State/Zip: [place] Client [place] Day - Client Client Site [place] - Day Physician Arnette Norris Contact Type Call Call Type Triage / Clinical Caller Name Gladys Relationship To Patient Spouse Appointment Disposition EMR Appointment Not Necessary Info pasted into Epic Yes Return Phone Number [phone] (Primary) Chief Complaint Cuts and Lacerations Initial Comment caller states her husband cut his hand 3 days ago - it is still bleeding - he is on a blood thinner PreDisposition Did not know what to do Nurse Assessment Nurse: Vallery Sa, RN, [place] Date/Time (Eastern Time): 10/06/2014 8:19:26 AM Confirm and document reason for call. If symptomatic, describe symptoms. ---Caller states her husband cut his right hand on a gutter three days ago and continues to have bleeding. No fever. Has the patient traveled out of the country within the last 30 days? ---No Does the patient require triage?  ---Yes Related visit to physician within the last 2 weeks? ---Yes Does the PT have any chronic conditions? (i.e. diabetes, asthma, etc.) ---Yes List chronic conditions. ---Takes blood thinners for cardiac stents Guidelines Guideline Title Affirmed Question Affirmed Notes Nurse Date/Time (Eastern Time) Cuts and Lacerations [1] Bleeding AND [2] won't stop after 10 minutes of direct pressure (using correct technique) [place], RN, Cathy 10/06/2014 8:20:49 AM Disp. Time Eilene Ghazi Time) Disposition Final User 10/06/2014 8:16:36 AM Send to Urgent Queue Byrd Hesselbach 10/06/2014 8:22:14 AM Go to ED Now Yes Vallery Sa, RN, Rosey Bath Understands: Yes PLEASE NOTE: All timestamps contained within this report are represented as [place] Standard Time. CONFIDENTIALTY NOTICE: This fax transmission is intended only for the addressee. It contains information that is legally privileged, confidential or otherwise protected from use or disclosure. If you are not the intended recipient, you are strictly prohibited from reviewing, disclosing, copying using or disseminating any of this information or taking any action in reliance on or regarding this information. If you have received this fax in error, please notify us immediately by telephone so that we can arrange for its return to [place]. Phone: [phone], Toll-Free: [phone], Fax: [phone] Page: 2 of 2 Call Id: [id number] Disagree/Comply: Comply Care Advice Given Per Guideline GO TO ED NOW: You need to be seen in the Emergency Department. Go to the ER at ___________ [place] now. Drive carefully. BLEEDING: Continue direct pressure with a sterile gauze or clean cloth until seen. CARE ADVICE given per Cuts and Lacerations (Adult) guideline. After Care Instructions Given Call Event Type User Date / Time Description Referrals [facility] - ED

## 2014-11-01 ENCOUNTER — Other Ambulatory Visit: Payer: Self-pay | Admitting: Cardiovascular Disease

## 2015-01-11 ENCOUNTER — Encounter: Payer: Self-pay | Admitting: *Deleted

## 2015-01-11 ENCOUNTER — Telehealth: Payer: Self-pay | Admitting: Cardiovascular Disease

## 2015-01-11 NOTE — Telephone Encounter (Signed)
New Message    Pt called to schedule f/u appt per recall, no appt's available w/ Dr. Angelena Form, pt declined to see a PA. Please call pt back and advise.

## 2015-01-11 NOTE — Telephone Encounter (Signed)
Appointment made for 845am Friday December 30th at 845am with Dr. Timmothy Sours

## 2015-01-26 ENCOUNTER — Ambulatory Visit (INDEPENDENT_AMBULATORY_CARE_PROVIDER_SITE_OTHER): Payer: Medicare PPO | Admitting: Cardiovascular Disease

## 2015-01-26 ENCOUNTER — Encounter: Payer: Self-pay | Admitting: Cardiovascular Disease

## 2015-01-26 VITALS — BP 138/60 | HR 67 | Ht 70.0 in | Wt 215.4 lb

## 2015-01-26 DIAGNOSIS — I10 Essential (primary) hypertension: Secondary | ICD-10-CM

## 2015-01-26 DIAGNOSIS — I2510 Atherosclerotic heart disease of native coronary artery without angina pectoris: Secondary | ICD-10-CM | POA: Diagnosis not present

## 2015-01-26 DIAGNOSIS — E785 Hyperlipidemia, unspecified: Secondary | ICD-10-CM | POA: Diagnosis not present

## 2015-01-26 DIAGNOSIS — I779 Disorder of arteries and arterioles, unspecified: Secondary | ICD-10-CM

## 2015-01-26 DIAGNOSIS — I493 Ventricular premature depolarization: Secondary | ICD-10-CM

## 2015-01-26 DIAGNOSIS — I739 Peripheral vascular disease, unspecified: Secondary | ICD-10-CM

## 2015-01-26 MED ORDER — METOPROLOL TARTRATE 50 MG PO TABS: 50.0000 mg | ORAL_TABLET | Freq: Two times a day (BID) | ORAL | Status: DC

## 2015-01-26 MED ORDER — RAMIPRIL 2.5 MG PO CAPS: 2.5000 mg | ORAL_CAPSULE | Freq: Two times a day (BID) | ORAL | Status: DC

## 2015-01-26 MED ORDER — CLOPIDOGREL BISULFATE 75 MG PO TABS: 75.0000 mg | ORAL_TABLET | Freq: Every day | ORAL | Status: DC

## 2015-01-26 MED ORDER — NITROGLYCERIN 0.4 MG SL SUBL: 0.4000 mg | SUBLINGUAL_TABLET | SUBLINGUAL | Status: DC | PRN

## 2015-01-26 MED ORDER — ATORVASTATIN CALCIUM 80 MG PO TABS: 80.0000 mg | ORAL_TABLET | Freq: Every day | ORAL | Status: DC

## 2015-01-26 NOTE — Patient Instructions (Signed)
Medication Instructions:  Your physician recommends that you continue on your current medications as directed. Please refer to the Current Medication list given to you today.   Labwork: Your physician recommends that you return for lab work next Wednesday This is fasting.  The lab opens at 7:30 AM   Testing/Procedures: none  Follow-Up: Your physician wants you to follow-up in: 12 months.  You will receive a reminder letter in the mail two months in advance. If you don't receive a letter, please call our office to schedule the follow-up appointment.   Any Other Special Instructions Will Be Listed Below (If Applicable).     If you need a refill on your cardiac medications before your next appointment, please call your pharmacy.

## 2015-01-26 NOTE — Progress Notes (Signed)
Chief Complaint  Patient presents with  . Hypertension  . Coronary Artery Disease    pt has no complaints  . Follow-up      History of Present Illness: 81 yo WM with h/o CAD, HTN, HLD, PVCs here today for cardiac follow up. He has been followed in the past by Dr. Olevia Perches. In 1998 he had PTCA of the LAD. In 2005 he had a drug-eluting stent placed in the LAD and then a short time later had a second drug-eluting stent placed in the LAD overlapping the first for an edge tear. 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 Carotid dopplers May 2016 with 40-59% RICA stenosis, [id number] LICA stenosis.   He is here today for follow up. He has had no recent chest pain, shortness of breath or palpitations. He continues to have shoulder pain. This is felt to be orthopedic related. He has been chopping wood and working on his farm without any trouble.    Primary Care Physician: Arnette Norris   Past Medical History  Diagnosis Date  . Impotence     of organic nature  . Prostate cancer [facility])     s/p prostatectomy  . CAD (coronary artery disease)     PTCA 1998 LAD-s/p overlapping DES in LAD in 05/2003  . HTN (hypertension)   . Hyperlipidemia   . History of colonoscopy   . Elevated glucose   . Spinal disease     lumbosacral  . Chest pain 5/5-5/11/2003    Hospital MI, CAD, INCR, CHOL, HTN    Past Surgical History  Procedure Laterality Date  . Pilonidal cystectomy  late 52s  . Angioplasty  1992  . Bone spur right foot      Bilateral  . Prostatectomy    . Laminectomy    . Left shoulder decompression    . Right carpal tunnel repair  11/18/2007    Dr. Daylene Katayama  . Coronary angioplasty  06/02/2003    PTCA, occl of vessel w/good collaterals    Current Outpatient Prescriptions  Medication Sig Dispense Refill  . atorvastatin (LIPITOR) 80 MG tablet Take 1 tablet (80 mg total) by mouth daily. 90 tablet 3  . calcium carbonate (OS-CAL) 600 MG  TABS Take 600 mg by mouth daily.      . clopidogrel (PLAVIX) 75 MG tablet Take 1 tablet (75 mg total) by mouth daily. 90 tablet 3  . Fish Oil OIL Take 1 capsule by mouth daily.     . metoprolol (LOPRESSOR) 50 MG tablet Take 1 tablet (50 mg total) by mouth 2 (two) times daily. 180 tablet 3  . nitroGLYCERIN (NITROSTAT) 0.4 MG SL tablet Place 1 tablet (0.4 mg total) under the tongue every 5 (five) minutes as needed. For chest pain 26 tablet 6  . ramipril (ALTACE) 2.5 MG capsule Take 1 capsule (2.5 mg total) by mouth 2 (two) times daily. 180 capsule 3   No current facility-administered medications for this visit.    Allergies  Allergen Reactions  . Tetracycline Rash    Social History   Social History  . Marital Status: Married    Spouse Name: N/A  . Number of Children: 3  . Years of Education: N/A   Occupational History  . 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    Social History Main Topics  . Smoking status: Never Smoker   . Smokeless tobacco: Current User    Types:  Chew  . Alcohol Use: 0.5 oz/week    1 drink(s) per week     Comment: occassionally  . Drug Use: No  . Sexual Activity: No   Other Topics Concern  . 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    Family History  Problem Relation Age of Onset  . Prostate cancer Father   . Cancer Father     prostate  . Colon cancer Mother   . Leukemia Mother   . Cancer Mother     colon, leukemia    Review of Systems:  As stated in the HPI and otherwise negative.   BP 138/60 mmHg  Pulse 67  Ht 5\' 10"  (1.778 m)  Wt 215 lb 6.4 oz (97.705 kg)  BMI 30.91 kg/m2  Physical Examination: General: Well developed, well nourished, NAD HEENT: OP clear, mucus membranes moist SKIN: warm, dry. No rashes. Neuro: No focal deficits Musculoskeletal: Muscle  strength 5/5 all ext Psychiatric: Mood and affect normal Neck: No JVD, no carotid bruits, no thyromegaly, no lymphadenopathy. Lungs:Clear bilaterally, no wheezes, rhonci, crackles Cardiovascular: Regular rate and rhythm. No murmurs, gallops or rubs. Abdomen:Soft. Bowel sounds present. Non-tender.  Extremities: No lower extremity edema. Pulses are 2 + in the bilateral DP/PT.  EKG:  EKG is ordered today. The ekg ordered today demonstrates NSR, rate 67 bpm. Non-specific T wave abn.   Recent Labs: 10/06/2014: BUN 9; Creatinine, Ser 0.80; Hemoglobin 13.6; Potassium 4.3; Sodium 140   Lipid Panel    Component Value Date/Time   CHOL 130 11/29/2013 0759   TRIG 140.0 11/29/2013 0759   HDL 33.70* 11/29/2013 0759   CHOLHDL 4 11/29/2013 0759   VLDL 28.0 11/29/2013 0759   LDLCALC 68 11/29/2013 0759     Wt Readings from Last 3 Encounters:  01/26/15 215 lb 6.4 oz (97.705 kg)  10/06/14 212 lb (96.163 kg)  06/14/14 213 lb (96.616 kg)     Other studies Reviewed: Additional studies/ records that were reviewed today include: . Review of the above records demonstrates:   Assessment and Plan:   1. CAD: Stable. He is on good therapy. Continue ASA/Plavix/beta blocker/Ace-inh/Lipitor.   2. HTN: BP well controlled. No changes today.   3. HLD: Continue statin. Lipids well controlled. Repeat lipids and LFTS.   4. Carotid artery disease: Mild to moderate by dopplers May 2016. Repeat May 2018.   5. PVCs: Continue beta blocker. No awareness of palpitations.   Current medicines are reviewed at length with the patient today.  The patient does not have concerns regarding medicines.  The following changes have been made:  no change  Labs/ tests ordered today include:   Orders Placed This Encounter  Procedures  . Lipid Profile  . Hepatic function panel  . EKG 12-Lead     Disposition:   FU with me in 12  months   Signed, Lauree Chandler, MD 01/26/2015 1:31 PM    [facility] [address], [place], [place]  [address] Phone: [phone]; Fax: [phone]

## 2015-01-31 ENCOUNTER — Other Ambulatory Visit (INDEPENDENT_AMBULATORY_CARE_PROVIDER_SITE_OTHER): Payer: Medicare Other | Admitting: *Deleted

## 2015-01-31 DIAGNOSIS — E785 Hyperlipidemia, unspecified: Secondary | ICD-10-CM | POA: Diagnosis not present

## 2015-01-31 LAB — HEPATIC FUNCTION PANEL
ALBUMIN: 4.1 g/dL (ref 3.6–5.1)
ALK PHOS: 66 U/L (ref 40–115)
ALT: 28 U/L (ref 9–46)
AST: 21 U/L (ref 10–35)
Bilirubin, Direct: 0.1 mg/dL (ref <=0.2)
Indirect Bilirubin: 0.7 mg/dL (ref 0.2–1.2)
TOTAL PROTEIN: 6.6 g/dL (ref 6.1–8.1)
Total Bilirubin: 0.8 mg/dL (ref 0.2–1.2)

## 2015-01-31 LAB — LIPID PANEL
CHOLESTEROL: 125 mg/dL (ref 125–200)
HDL: 35 mg/dL — AB (ref >=40)
LDL Cholesterol: 65 mg/dL (ref <130)
TRIGLYCERIDES: 126 mg/dL (ref <150)
Total CHOL/HDL Ratio: 3.6 Ratio (ref <=5.0)
VLDL: 25 mg/dL (ref <30)

## 2015-04-03 ENCOUNTER — Ambulatory Visit (INDEPENDENT_AMBULATORY_CARE_PROVIDER_SITE_OTHER): Payer: Medicare Other | Admitting: Family Medicine

## 2015-04-03 ENCOUNTER — Encounter: Payer: Self-pay | Admitting: Family Medicine

## 2015-04-03 VITALS — BP 132/64 | HR 60 | Temp 97.9°F | Ht 70.0 in | Wt 212.0 lb

## 2015-04-03 DIAGNOSIS — Z8546 Personal history of malignant neoplasm of prostate: Secondary | ICD-10-CM

## 2015-04-03 DIAGNOSIS — I25111 Atherosclerotic heart disease of native coronary artery with angina pectoris with documented spasm: Secondary | ICD-10-CM

## 2015-04-03 DIAGNOSIS — Z23 Encounter for immunization: Secondary | ICD-10-CM | POA: Diagnosis not present

## 2015-04-03 DIAGNOSIS — Z0000 Encounter for general adult medical examination without abnormal findings: Secondary | ICD-10-CM | POA: Diagnosis not present

## 2015-04-03 DIAGNOSIS — C61 Malignant neoplasm of prostate: Secondary | ICD-10-CM

## 2015-04-03 DIAGNOSIS — E782 Mixed hyperlipidemia: Secondary | ICD-10-CM | POA: Diagnosis not present

## 2015-04-03 DIAGNOSIS — I10 Essential (primary) hypertension: Secondary | ICD-10-CM | POA: Diagnosis not present

## 2015-04-03 LAB — COMPREHENSIVE METABOLIC PANEL
ALT: 25 U/L (ref 0–53)
AST: 18 U/L (ref 0–37)
Albumin: 4.4 g/dL (ref 3.5–5.2)
Alkaline Phosphatase: 74 U/L (ref 39–117)
BUN: 14 mg/dL (ref 6–23)
CHLORIDE: 107 meq/L (ref 96–112)
CO2: 29 mEq/L (ref 19–32)
Calcium: 9.8 mg/dL (ref 8.4–10.5)
Creatinine, Ser: 0.85 mg/dL (ref 0.40–1.50)
GFR: 92.1 mL/min (ref >60.00)
GLUCOSE: 99 mg/dL (ref 70–99)
POTASSIUM: 5.8 meq/L — AB (ref 3.5–5.1)
SODIUM: 142 meq/L (ref 135–145)
Total Bilirubin: 0.9 mg/dL (ref 0.2–1.2)
Total Protein: 6.4 g/dL (ref 6.0–8.3)

## 2015-04-03 LAB — PSA, MEDICARE: PSA: 0.01 ng/ml — ABNORMAL LOW (ref 0.10–4.00)

## 2015-04-03 NOTE — Progress Notes (Signed)
Subjective:   Patient ID: [name], male    DOB: 03-18-1932, 82 y.o.   MRN: [id number]  AANAY [name] is a pleasant 82 y.o. year old male who presents to clinic today with Annual Exam  and follow up of chronic medical conditions on 04/03/2015  HPI:  I have personally reviewed the Medicare Annual Wellness questionnaire and have noted 1. The patient's medical and social history 2. Their use of alcohol, tobacco or illicit drugs 3. Their current medications and supplements 4. The patient's functional ability including ADL's, fall risks, home safety risks and hearing or visual             impairment. 5. Diet and physical activities 6. Evidence for depression or mood disorders  End of life wishes discussed and updated in Social History.  Colonoscopy 01/12/12 Zoster 01/05/2007 Dtap 10/06/14 Pneumovax 12/21/02 Influenza vaccine in 10/2014 at [facility].  The roster of all physicians providing medical care to patient - is listed in the CareTeams section of the chart.  CAD- followed by Darlina Guys.  Was last seen a couple of months ago.  Note reviewed from 12/20//16. Advised continuing ASA, plavix, Betablocker, ACEI and statin.  HLD-  Lipids well controlled on Lipitor 80 mg daily.  Lab Results  Component Value Date   CHOL 125 01/31/2015   HDL 35* 01/31/2015   LDLCALC 65 01/31/2015   TRIG 126 01/31/2015   CHOLHDL 3.6 01/31/2015   Lab Results  Component Value Date   PSA 0.01* 12/02/2010   PSA 0.01* 11/09/2009   PSA 0.01* 10/26/2008   Lab Results  Component Value Date   NA 140 10/06/2014   K 4.3 10/06/2014   CL 103 10/06/2014   CO2 29 06/14/2014   Lab Results  Component Value Date   CREATININE 0.80 10/06/2014   Current Outpatient Prescriptions on File Prior to Visit  Medication Sig Dispense Refill  . atorvastatin (LIPITOR) 80 MG tablet Take 1 tablet (80 mg total) by mouth daily. 90 tablet 3  . calcium carbonate (OS-CAL) 600 MG TABS Take 600 mg by mouth daily.      .  clopidogrel (PLAVIX) 75 MG tablet Take 1 tablet (75 mg total) by mouth daily. 90 tablet 3  . Fish Oil OIL Take 1 capsule by mouth daily.     . metoprolol (LOPRESSOR) 50 MG tablet Take 1 tablet (50 mg total) by mouth 2 (two) times daily. 180 tablet 3  . nitroGLYCERIN (NITROSTAT) 0.4 MG SL tablet Place 1 tablet (0.4 mg total) under the tongue every 5 (five) minutes as needed. For chest pain 26 tablet 6  . ramipril (ALTACE) 2.5 MG capsule Take 1 capsule (2.5 mg total) by mouth 2 (two) times daily. 180 capsule 3   No current facility-administered medications on file prior to visit.    Allergies  Allergen Reactions  . Tetracycline Rash    Past Medical History  Diagnosis Date  . Impotence     of organic nature  . Prostate cancer [facility])     s/p prostatectomy  . CAD (coronary artery disease)     PTCA 1998 LAD-s/p overlapping DES in LAD in 05/2003  . HTN (hypertension)   . Hyperlipidemia   . History of colonoscopy   . Elevated glucose   . Spinal disease     lumbosacral  . Chest pain 5/5-5/11/2003    Hospital MI, CAD, INCR, CHOL, HTN    Past Surgical History  Procedure Laterality Date  . Pilonidal cystectomy  late  49s  . Angioplasty  1992  . Bone spur right foot      Bilateral  . Prostatectomy    . Laminectomy    . Left shoulder decompression    . Right carpal tunnel repair  11/18/2007    Dr. Daylene Katayama  . Coronary angioplasty  06/02/2003    PTCA, occl of vessel w/good collaterals    Family History  Problem Relation Age of Onset  . Prostate cancer Father   . Cancer Father     prostate  . Colon cancer Mother   . Leukemia Mother   . Cancer Mother     colon, leukemia    Social History   Social History  . Marital Status: Married    Spouse Name: N/A  . Number of Children: 3  . Years of Education: N/A   Occupational History  . 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    Social History Main Topics  . Smoking status: Never Smoker   . Smokeless tobacco: Current User    Types:  Chew  . Alcohol Use: 0.5 oz/week    1 drink(s) per week     Comment: occassionally  . Drug Use: No  . Sexual Activity: No   Other Topics Concern  . 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   Would desire CPR   The PMH, PSH, Social History, Family History, Medications, and allergies have been reviewed in [name], and have been updated if relevant.   Review of Systems  Constitutional: Negative.   HENT: Negative.   Eyes: Negative.   Respiratory: Negative.   Cardiovascular: Negative.   Gastrointestinal: Negative.   Endocrine: Negative.   Genitourinary: Negative.   Musculoskeletal: Negative.   Skin: Negative.   Allergic/Immunologic: Negative.   Neurological: Negative.   Hematological: Negative.   Psychiatric/Behavioral: Negative.   All other systems reviewed and are negative.      Objective:    BP 132/64 mmHg  Pulse 60  Temp(Src) 97.9 F (36.6 C) (Oral)  Ht 5\' 10"  (1.778 m)  Wt 212 lb (96.163 kg)  BMI 30.42 kg/m2  SpO2 97%  Wt Readings from Last 3 Encounters:  04/03/15 212 lb (96.163 kg)  01/26/15 215 lb 6.4 oz (97.705 kg)  10/06/14 212 lb (96.163 kg)     Physical Exam  Constitutional: He is oriented to person, place, and time. He appears well-developed and well-nourished. No distress.  HENT:  Head: Normocephalic and atraumatic.  Eyes: Conjunctivae are normal.  Neck: Normal range of motion.  Cardiovascular: Normal rate and regular rhythm.   Pulmonary/Chest: Effort normal and breath sounds normal.  Musculoskeletal: He exhibits no edema.  Neurological: He is alert and oriented to person, place, and time. No cranial nerve deficit.  Skin: Skin is warm and dry. He is not diaphoretic.  Psychiatric: He has a normal mood and affect. His behavior is normal. Judgment and thought content  normal.  Nursing note and vitals reviewed.         Assessment & Plan:   Medicare annual wellness visit, subsequent - Plan: PSA, Medicare  HYPERTENSION, BENIGN ESSENTIAL - Plan: Comprehensive metabolic panel  HYPERLIPIDEMIA, MIXED  Coronary artery disease involving native coronary artery of native heart with angina pectoris with documented spasm ([place]) No Follow-up on file.

## 2015-04-03 NOTE — Addendum Note (Signed)
Addended by: Lucille Passy on: 04/03/2015 09:45 AM   Modules accepted: Miquel Dunn

## 2015-04-03 NOTE — Assessment & Plan Note (Signed)
S/p prostatectomy. PSA today.

## 2015-04-03 NOTE — Assessment & Plan Note (Signed)
Well controlled.  No changes made. 

## 2015-04-03 NOTE — Assessment & Plan Note (Addendum)
The patients weight, height, BMI and visual acuity have been recorded in the chart.  Cognitive function assessed.   I have made referrals, counseling and provided education to the patient based review of the above and I have provided the pt with a written personalized care plan for preventive services.  Prevnar 13 given today.  Orders Placed This Encounter  Procedures  . Comprehensive metabolic panel  . PSA, Medicare

## 2015-04-03 NOTE — Assessment & Plan Note (Signed)
Well controlled on current rxs. No changes made. 

## 2015-04-03 NOTE — Assessment & Plan Note (Signed)
Stable on medical therapy. No changes made to rxs. Followed by cardiology.

## 2015-04-03 NOTE — Progress Notes (Signed)
Pre visit review using our clinic review tool, if applicable. No additional management support is needed unless otherwise documented below in the visit note. 

## 2015-04-04 ENCOUNTER — Encounter: Payer: Self-pay | Admitting: *Deleted

## 2015-04-10 ENCOUNTER — Other Ambulatory Visit (INDEPENDENT_AMBULATORY_CARE_PROVIDER_SITE_OTHER): Payer: Medicare Other

## 2015-04-10 DIAGNOSIS — E875 Hyperkalemia: Secondary | ICD-10-CM | POA: Diagnosis not present

## 2015-04-10 LAB — BASIC METABOLIC PANEL
BUN: 11 mg/dL (ref 6–23)
CO2: 28 mEq/L (ref 19–32)
Calcium: 9.3 mg/dL (ref 8.4–10.5)
Chloride: 105 mEq/L (ref 96–112)
Creatinine, Ser: 0.88 mg/dL (ref 0.40–1.50)
GFR: 88.48 mL/min (ref >60.00)
Glucose, Bld: 192 mg/dL — ABNORMAL HIGH (ref 70–99)
Potassium: 4.8 mEq/L (ref 3.5–5.1)
Sodium: 139 mEq/L (ref 135–145)

## 2015-04-10 NOTE — Addendum Note (Signed)
Addended by: Ellamae Sia on: 04/10/2015 08:31 AM   Modules accepted: Orders

## 2015-04-11 ENCOUNTER — Encounter: Payer: Self-pay | Admitting: *Deleted

## 2015-08-25 ENCOUNTER — Ambulatory Visit (HOSPITAL_COMMUNITY)
Admission: EM | Admit: 2015-08-25 | Discharge: 2015-08-25 | Disposition: A | Discharge status: Home / Self Care | Payer: Medicare Other | Attending: Emergency Medicine | Admitting: Emergency Medicine

## 2015-08-25 ENCOUNTER — Encounter (HOSPITAL_COMMUNITY): Payer: Self-pay | Admitting: *Deleted

## 2015-08-25 DIAGNOSIS — A6920 Lyme disease, unspecified: Secondary | ICD-10-CM | POA: Insufficient documentation

## 2015-08-25 DIAGNOSIS — Z79899 Other long term (current) drug therapy: Secondary | ICD-10-CM | POA: Insufficient documentation

## 2015-08-25 DIAGNOSIS — R21 Rash and other nonspecific skin eruption: Secondary | ICD-10-CM | POA: Diagnosis present

## 2015-08-25 DIAGNOSIS — Z9889 Other specified postprocedural states: Secondary | ICD-10-CM | POA: Insufficient documentation

## 2015-08-25 MED ORDER — DOXYCYCLINE HYCLATE 100 MG PO CAPS: 100.0000 mg | ORAL_CAPSULE | Freq: Two times a day (BID) | ORAL | 0 refills | Status: AC

## 2015-08-25 NOTE — ED Triage Notes (Signed)
Pt  Reports  He  Pulled  Tick  Off      2   Weeks  Ago   He  Reports     He  Developed  A   Round  Rash  With  [place]       Earlier  - he  Reports  It  Is  Bigger  Now   And  Has  Changed  Somewhat    He  denys  Ant  Fever     Body  Aches  Or  otther  Symptoms

## 2015-08-25 NOTE — ED Provider Notes (Signed)
CSN: [id number]     Arrival date & time 08/25/15  1626 History   First MD Initiated Contact with Patient 08/25/15 1850     Chief Complaint  Patient presents with  . Rash   (Consider location/radiation/quality/duration/timing/severity/associated sxs/prior Treatment) [name] is a 82 year old elderly male presents today for a rash to his left lower leg x 2 weeks from a tick bite 2 weeks ago. The tick was removed that same night.  Patient reports that the rash is getting bigger everyday. He denies pain at the rash but when he "wants to scratch the rash if he puts his hand on it". Besides the rash, he is otherwise asymptomatic without headache, myalgia, joint pain, headache, abdominal pain or nausea. He does endorses fatigue but his fatigue is indifferent than usual.     Rash  Associated symptoms: fatigue   Associated symptoms: no abdominal pain, no diarrhea, no fever, no headaches, no joint pain, no myalgias, no nausea, no shortness of breath and not vomiting     Past Medical History:  Diagnosis Date  . CAD (coronary artery disease)    PTCA 1998 LAD-s/p overlapping DES in LAD in 05/2003  . Chest pain 5/5-5/11/2003   Hospital MI, CAD, INCR, CHOL, HTN  . Elevated glucose   . History of colonoscopy   . HTN (hypertension)   . Hyperlipidemia   . Impotence    of organic nature  . Prostate cancer [facility])    s/p prostatectomy  . Spinal disease    lumbosacral   Past Surgical History:  Procedure Laterality Date  . ANGIOPLASTY  1992  . bone spur right foot     Bilateral  . CORONARY ANGIOPLASTY  06/02/2003   PTCA, occl of vessel w/good collaterals  . LAMINECTOMY    . left shoulder decompression    . pilonidal cystectomy  late 20s  . PROSTATECTOMY    . right carpal tunnel repair  11/18/2007   Dr. Daylene Katayama   Family History  Problem Relation Age of Onset  . Prostate cancer Father   . Cancer Father     prostate  . Colon cancer Mother   . Leukemia Mother   . Cancer Mother     colon,  leukemia   Social History  Substance Use Topics  . Smoking status: Never Smoker  . Smokeless tobacco: Current User    Types: Chew  . Alcohol use 0.5 oz/week    1 Standard drinks or equivalent per week     Comment: occassionally    Review of Systems  Constitutional: Positive for fatigue. Negative for chills and fever.  Respiratory: Negative for cough and shortness of breath.   Cardiovascular: Negative for chest pain and palpitations.  Gastrointestinal: Negative for abdominal pain, diarrhea, nausea and vomiting.  Musculoskeletal: Negative for arthralgias and myalgias.  Skin: Positive for rash.  Neurological: Negative for dizziness, weakness and headaches.    Allergies  Tetracycline  Home Medications   Prior to Admission medications   Medication Sig Start Date End Date Taking? Authorizing Provider  atorvastatin (LIPITOR) 80 MG tablet Take 1 tablet (80 mg total) by mouth daily. 01/26/15   Burnell Blanks, MD  calcium carbonate (OS-CAL) 600 MG TABS Take 600 mg by mouth daily.      Historical Provider, MD  clopidogrel (PLAVIX) 75 MG tablet Take 1 tablet (75 mg total) by mouth daily. 01/26/15   Burnell Blanks, MD  doxycycline (VIBRAMYCIN) 100 MG capsule Take 1 capsule (100 mg total) by mouth 2 (two)  times daily. 08/25/15 09/04/15  Barry Dienes, NP  Fish Oil OIL Take 1 capsule by mouth daily.     Historical Provider, MD  metoprolol (LOPRESSOR) 50 MG tablet Take 1 tablet (50 mg total) by mouth 2 (two) times daily. 01/26/15   Burnell Blanks, MD  nitroGLYCERIN (NITROSTAT) 0.4 MG SL tablet Place 1 tablet (0.4 mg total) under the tongue every 5 (five) minutes as needed. For chest pain 01/26/15   Burnell Blanks, MD  ramipril (ALTACE) 2.5 MG capsule Take 1 capsule (2.5 mg total) by mouth 2 (two) times daily. 01/26/15   Burnell Blanks, MD   Meds Ordered and Administered this Visit  Medications - No data to display  BP 138/65 (BP Location: Right Arm)   Pulse  74   Temp 98 F (36.7 C)   Resp 16   SpO2 97%  No data found.   Physical Exam  Constitutional: He is oriented to person, place, and time. He appears well-developed and well-nourished.  HENT:  Head: Normocephalic and atraumatic.  Eyes: Conjunctivae are normal. Pupils are equal, round, and reactive to light.  Neck: Normal range of motion. Neck supple.  Cardiovascular: Normal rate, regular rhythm and normal heart sounds.   Pulmonary/Chest: Effort normal and breath sounds normal.  Lymphadenopathy:    He has no cervical adenopathy.  Neurological: He is alert and oriented to person, place, and time.  Skin: Skin is warm and dry. Rash noted.       Urgent Care Course   Clinical Course    Procedures (including critical care time)  Labs Review [place] ANTIBODIES    Imaging Review No results found.   Visual Acuity Review  Right Eye Distance:   Left Eye Distance:   Bilateral Distance:    Right Eye Near:   Left Eye Near:    Bilateral Near:         MDM   1. Early localized Lyme disease    Prescriptions given (see above). Reviewed directions for usage and side effects. Patient states understanding and will call with questions or problems. Patient instructed to call or follow up with his/her primary care doctor if failure to improve or change in symptoms. Discharge instruction given.    Barry Dienes, NP 08/25/15 2109

## 2015-08-28 LAB — LYME, WESTERN BLOT, SERUM (REFLEXED)
IGG P28 AB.: ABSENT
IgG P18 Ab.: ABSENT
IgG P30 Ab.: ABSENT
LYME IGG WB: POSITIVE — AB
LYME IGM WB: POSITIVE — AB

## 2015-08-28 LAB — B. BURGDORFI ANTIBODIES: B BURGDORFERI AB IGG+ IGM: 1.55 {ISR} — AB (ref 0.00–0.90)

## 2015-09-02 ENCOUNTER — Encounter (HOSPITAL_COMMUNITY): Payer: Self-pay | Admitting: Emergency Medicine

## 2015-09-02 ENCOUNTER — Ambulatory Visit (HOSPITAL_COMMUNITY)
Admission: EM | Admit: 2015-09-02 | Discharge: 2015-09-02 | Disposition: A | Discharge status: Home / Self Care | Payer: Medicare Other | Attending: Emergency Medicine | Admitting: Emergency Medicine

## 2015-09-02 DIAGNOSIS — T361X5A Adverse effect of cephalosporins and other beta-lactam antibiotics, initial encounter: Principal | ICD-10-CM

## 2015-09-02 DIAGNOSIS — L270 Generalized skin eruption due to drugs and medicaments taken internally: Secondary | ICD-10-CM | POA: Diagnosis not present

## 2015-09-02 MED ORDER — HYDROXYZINE HCL 25 MG PO TABS: 25.0000 mg | ORAL_TABLET | Freq: Four times a day (QID) | ORAL | 0 refills | Status: DC

## 2015-09-02 MED ORDER — METHYLPREDNISOLONE ACETATE 80 MG/ML IJ SUSP
80.0000 mg | Freq: Once | INTRAMUSCULAR | Status: AC
  Administered 2015-09-02: 80 mg via INTRAMUSCULAR

## 2015-09-02 MED ORDER — METHYLPREDNISOLONE ACETATE 80 MG/ML IJ SUSP
INTRAMUSCULAR | Status: AC
  Filled 2015-09-02: qty 1

## 2015-09-02 NOTE — Discharge Instructions (Signed)
STOP DOXYCYCLINE AT THIS TIME  NO FURTHER NEED FOR ANTIBIOTICS.

## 2015-09-02 NOTE — ED Provider Notes (Signed)
CSN: [id number]     Arrival date & time 09/02/15  1232 History   First MD Initiated Contact with Patient 09/02/15 1301     Chief Complaint  Patient presents with  . Medication Reaction   (Consider location/radiation/quality/duration/timing/severity/associated sxs/prior Treatment) HPI Patient is an 82 year old man who was treated for Lyme disease with doxycycline. He now has a rash from the medication. He states he has been out in the sun several times also. He has no other symptoms at this time and no fever nausea vomiting no joint or arthritic pains. Past Medical History:  Diagnosis Date  . CAD (coronary artery disease)    PTCA 1998 LAD-s/p overlapping DES in LAD in 05/2003  . Chest pain 5/5-5/11/2003   Hospital MI, CAD, INCR, CHOL, HTN  . Elevated glucose   . History of colonoscopy   . HTN (hypertension)   . Hyperlipidemia   . Impotence    of organic nature  . Prostate cancer [facility])    s/p prostatectomy  . Spinal disease    lumbosacral   Past Surgical History:  Procedure Laterality Date  . ANGIOPLASTY  1992  . bone spur right foot     Bilateral  . CORONARY ANGIOPLASTY  06/02/2003   PTCA, occl of vessel w/good collaterals  . LAMINECTOMY    . left shoulder decompression    . pilonidal cystectomy  late 20s  . PROSTATECTOMY    . right carpal tunnel repair  11/18/2007   Dr. Daylene Katayama   Family History  Problem Relation Age of Onset  . Prostate cancer Father   . Cancer Father     prostate  . Colon cancer Mother   . Leukemia Mother   . Cancer Mother     colon, leukemia   Social History  Substance Use Topics  . Smoking status: Never Smoker  . Smokeless tobacco: Current User    Types: Chew  . Alcohol use 0.5 oz/week    1 Standard drinks or equivalent per week     Comment: occassionally    Review of Systems  Denies: HEADACHE, NAUSEA, ABDOMINAL PAIN, CHEST PAIN, CONGESTION, DYSURIA, SHORTNESS OF BREATH  Allergies  Tetracycline  Home Medications   Prior to  Admission medications   Medication Sig Start Date End Date Taking? Authorizing Provider  atorvastatin (LIPITOR) 80 MG tablet Take 1 tablet (80 mg total) by mouth daily. 01/26/15  Yes Burnell Blanks, MD  calcium carbonate (OS-CAL) 600 MG TABS Take 600 mg by mouth daily.     Yes Historical Provider, MD  clopidogrel (PLAVIX) 75 MG tablet Take 1 tablet (75 mg total) by mouth daily. 01/26/15  Yes Burnell Blanks, MD  doxycycline (VIBRAMYCIN) 100 MG capsule Take 1 capsule (100 mg total) by mouth 2 (two) times daily. 08/25/15 09/04/15 Yes Barry Dienes, NP  Fish Oil OIL Take 1 capsule by mouth daily.    Yes Historical Provider, MD  metoprolol (LOPRESSOR) 50 MG tablet Take 1 tablet (50 mg total) by mouth 2 (two) times daily. 01/26/15  Yes Burnell Blanks, MD  ramipril (ALTACE) 2.5 MG capsule Take 1 capsule (2.5 mg total) by mouth 2 (two) times daily. 01/26/15  Yes Burnell Blanks, MD  hydrOXYzine (ATARAX/VISTARIL) 25 MG tablet Take 1 tablet (25 mg total) by mouth every 6 (six) hours. 09/02/15   Konrad Felix, PA  nitroGLYCERIN (NITROSTAT) 0.4 MG SL tablet Place 1 tablet (0.4 mg total) under the tongue every 5 (five) minutes as needed. For chest pain 01/26/15  Burnell Blanks, MD   Meds Ordered and Administered this Visit   Medications  methylPREDNISolone acetate (DEPO-MEDROL) injection 80 mg (80 mg Intramuscular Given 09/02/15 1356)    BP 152/61 (BP Location: Left Arm)   Pulse 72   Temp 98.4 F (36.9 C) (Oral)   SpO2 98%  No data found.   Physical Exam NURSES NOTES AND VITAL SIGNS REVIEWED. CONSTITUTIONAL: Well developed, well nourished, no acute distress HEENT: normocephalic, atraumatic EYES: Conjunctiva normal NECK:normal ROM, supple, no adenopathy PULMONARY:No respiratory distress, normal effort ABDOMINAL: Soft, ND, NT BS+, No CVAT MUSCULOSKELETAL: Normal ROM of all extremities,  SKIN: warm and dry drug eruption rash noted all body. There is also a large  Bullseye lesion noted on the left calf. PSYCHIATRIC: Mood and affect, behavior are normal  Urgent Care Course   Clinical Course    Procedures (including critical care time)  Labs Review Labs Reviewed - No data to display  Imaging Review No results found.   Visual Acuity Review  Right Eye Distance:   Left Eye Distance:   Bilateral Distance:    Right Eye Near:   Left Eye Near:    Bilateral Near:        Discussed case with Dr. Michel Bickers from infectious disease. He states that patient probably will be okay to stop doxycycline at this time and to treat his drug rash symptomatically. MDM   1. Cephalosporin drug rash     Patient is reassured that there are no issues that require transfer to higher level of care at this time or additional tests. Patient is advised to continue home symptomatic treatment. Patient is advised that if there are new or worsening symptoms to attend the emergency department, contact primary care provider, or return to UC. Instructions of care provided discharged home in stable condition.    THIS NOTE WAS GENERATED USING A VOICE RECOGNITION SOFTWARE PROGRAM. ALL REASONABLE EFFORTS  WERE MADE TO PROOFREAD THIS DOCUMENT FOR ACCURACY.  I have verbally reviewed the discharge instructions with the patient. A printed AVS was given to the patient.  All questions were answered prior to discharge.      Konrad Felix, [place] 09/02/15 2120

## 2015-09-02 NOTE — ED Triage Notes (Signed)
The patient presented to the [facility] with a complaint of a possible medication reaction to doxycycline. The patient was treated for a tick bite on 08/25/2015 and reported that today he noted a rash throughout his body. He did report some sun exposure while on the doxycycline.

## 2015-09-04 ENCOUNTER — Telehealth (HOSPITAL_COMMUNITY): Payer: Self-pay | Admitting: *Deleted

## 2015-09-28 ENCOUNTER — Ambulatory Visit (INDEPENDENT_AMBULATORY_CARE_PROVIDER_SITE_OTHER): Payer: Medicare Other | Admitting: Family Medicine

## 2015-09-28 ENCOUNTER — Encounter: Payer: Self-pay | Admitting: Family Medicine

## 2015-09-28 VITALS — BP 124/64 | HR 65 | Temp 98.1°F | Wt 209.8 lb

## 2015-09-28 DIAGNOSIS — R5383 Other fatigue: Secondary | ICD-10-CM | POA: Diagnosis not present

## 2015-09-28 DIAGNOSIS — R7309 Other abnormal glucose: Secondary | ICD-10-CM | POA: Diagnosis not present

## 2015-09-28 DIAGNOSIS — A6920 Lyme disease, unspecified: Secondary | ICD-10-CM | POA: Diagnosis not present

## 2015-09-28 LAB — COMPREHENSIVE METABOLIC PANEL
ALBUMIN: 4.3 g/dL (ref 3.5–5.2)
ALK PHOS: 90 U/L (ref 39–117)
ALT: 24 U/L (ref 0–53)
AST: 17 U/L (ref 0–37)
BUN: 12 mg/dL (ref 6–23)
CO2: 29 mEq/L (ref 19–32)
CREATININE: 0.84 mg/dL (ref 0.40–1.50)
Calcium: 9.6 mg/dL (ref 8.4–10.5)
Chloride: 104 mEq/L (ref 96–112)
GFR: 93.25 mL/min (ref >60.00)
GLUCOSE: 98 mg/dL (ref 70–99)
POTASSIUM: 4.5 meq/L (ref 3.5–5.1)
SODIUM: 138 meq/L (ref 135–145)
TOTAL PROTEIN: 6.9 g/dL (ref 6.0–8.3)
Total Bilirubin: 0.7 mg/dL (ref 0.2–1.2)

## 2015-09-28 LAB — CBC WITH DIFFERENTIAL/PLATELET
BASOS ABS: 0 10*3/uL (ref 0.0–0.1)
Basophils Relative: 0.5 % (ref 0.0–3.0)
EOS ABS: 0.3 10*3/uL (ref 0.0–0.7)
Eosinophils Relative: 3.5 % (ref 0.0–5.0)
HCT: 39.6 % (ref 39.0–52.0)
HEMOGLOBIN: 13.4 g/dL (ref 13.0–17.0)
LYMPHS ABS: 2.1 10*3/uL (ref 0.7–4.0)
Lymphocytes Relative: 27.7 % (ref 12.0–46.0)
MCHC: 33.8 g/dL (ref 30.0–36.0)
MCV: 92.1 fl (ref 78.0–100.0)
Monocytes Absolute: 0.8 10*3/uL (ref 0.1–1.0)
Monocytes Relative: 10.3 % (ref 3.0–12.0)
NEUTROS PCT: 58 % (ref 43.0–77.0)
Neutro Abs: 4.3 10*3/uL (ref 1.4–7.7)
Platelets: 181 10*3/uL (ref 150.0–400.0)
RBC: 4.3 Mil/uL (ref 4.22–5.81)
RDW: 13.1 % (ref 11.5–15.5)
WBC: 7.5 10*3/uL (ref 4.0–10.5)

## 2015-09-28 LAB — HEMOGLOBIN A1C: Hgb A1c MFr Bld: 6.4 % (ref 4.6–6.5)

## 2015-09-28 MED ORDER — AMOXICILLIN 500 MG PO CAPS: 500.0000 mg | ORAL_CAPSULE | Freq: Three times a day (TID) | ORAL | 0 refills | Status: AC

## 2015-09-28 NOTE — Patient Instructions (Signed)
Try to limit Tylenol PM use to no more than 1-2 times per week  Try melatonin for sleep- give it a two week trial

## 2015-09-28 NOTE — Progress Notes (Signed)
Pre visit review using our clinic review tool, if applicable. No additional management support is needed unless otherwise documented below in the visit note. 

## 2015-09-28 NOTE — Progress Notes (Signed)
Subjective:    Patient ID: [name], male    DOB: 03-22-1932, 82 y.o.   MRN: [id number]  HPI This is a pleasant 82 yo male who presents today for follow up of Lyme Disease. He was seen in UC 08/25/15 with rash (now resolved), was treated with doxycycline which he was able to tolerate for 6 days until he stopped due to drug reaction rash (now resolved). He doesn't feel like he has the energy he used to and has had difficulty sleeping which is unusual for him. He has had a mild headache which started prior to lyme diagnosis- thinks he needs new eye exam for glasses. No arthralgias/myalgias. No fever. He admits to being a little worried about diagnosis and has heard a many stories about people with poor outcomes related to lyme disease diagnosis.   Past Medical History:  Diagnosis Date  . CAD (coronary artery disease)    PTCA 1998 LAD-s/p overlapping DES in LAD in 05/2003  . Chest pain 5/5-5/11/2003   Hospital MI, CAD, INCR, CHOL, HTN  . Elevated glucose   . History of colonoscopy   . HTN (hypertension)   . Hyperlipidemia   . Impotence    of organic nature  . Prostate cancer [facility])    s/p prostatectomy  . Spinal disease    lumbosacral   Past Surgical History:  Procedure Laterality Date  . ANGIOPLASTY  1992  . bone spur right foot     Bilateral  . CORONARY ANGIOPLASTY  06/02/2003   PTCA, occl of vessel w/good collaterals  . LAMINECTOMY    . left shoulder decompression    . pilonidal cystectomy  late 20s  . PROSTATECTOMY    . right carpal tunnel repair  11/18/2007   Dr. Daylene Katayama   Family History  Problem Relation Age of Onset  . Prostate cancer Father   . Cancer Father     prostate  . Colon cancer Mother   . Leukemia Mother   . Cancer Mother     colon, leukemia   Social History  Substance Use Topics  . Smoking status: Never Smoker  . Smokeless tobacco: Current User    Types: Chew  . Alcohol use 0.5 oz/week    1 Standard drinks or equivalent per week     Comment:  occassionally      Review of Systems Per HPI    Objective:   Physical Exam Physical Exam  Vitals reviewed. Constitutional: Oriented to person, place, and time. Appears well-developed and well-nourished.  HENT:  Head: Normocephalic and atraumatic.  Eyes: Conjunctivae are normal.  Neck: Normal range of motion. Neck supple.  Cardiovascular: Normal rate.   Pulmonary/Chest: Effort normal.  Musculoskeletal: Normal range of motion.  Neurological: Alert and oriented to person, place, and time.  Skin: Skin is warm and dry.  Psychiatric: Normal mood and affect. Behavior is normal. Judgment and thought content normal.         BP 124/64   Pulse 65   Temp 98.1 F (36.7 C) (Oral)   Wt 209 lb 12 oz (95.1 kg)   SpO2 96%   BMI 30.10 kg/m   Assessment & Plan:  1. Lyme disease - discussed diagnosis and treatment with patient; since he was unable to complete doxy course, will have him complete amoxicillin 14 day course - amoxicillin (AMOXIL) 500 MG capsule; Take 1 capsule (500 mg total) by mouth 3 (three) times daily.  Dispense: 42 capsule; Refill: 0 - Comprehensive metabolic panel  2.  Other fatigue - CBC with Differential/Platelet - Comprehensive metabolic panel - Hemoglobin A1c  3. Elevated glucose- noted from labs 3/17 - Comprehensive metabolic panel - Hemoglobin A1c  - follow up with Dr. Deborra Medina in 4 weeks, sooner if increased fatigue, new rash, fevers.  Randy Reamer, FNP-BC  [facility]  09/28/2015 9:08 AM

## 2016-02-05 ENCOUNTER — Other Ambulatory Visit: Payer: Self-pay | Admitting: Cardiovascular Disease

## 2016-02-05 DIAGNOSIS — E785 Hyperlipidemia, unspecified: Secondary | ICD-10-CM

## 2016-02-05 DIAGNOSIS — I2510 Atherosclerotic heart disease of native coronary artery without angina pectoris: Secondary | ICD-10-CM

## 2016-02-05 DIAGNOSIS — I10 Essential (primary) hypertension: Secondary | ICD-10-CM

## 2016-02-05 NOTE — Telephone Encounter (Signed)
Rx refill sent to pharmacy. 

## 2016-03-14 ENCOUNTER — Encounter: Payer: Self-pay | Admitting: Cardiovascular Disease

## 2016-03-23 NOTE — Progress Notes (Signed)
Chief Complaint  Patient presents with  . Coronary Artery Disease     History of Present Illness: 83 yo WM with h/o CAD, HTN, HLD, PVCs here today for cardiac follow up. In 1998 he had PTCA of the LAD. In 2005 he had a drug-eluting stent placed in the LAD and then a short time later had a second drug-eluting stent placed in the LAD overlapping the first for an edge tear. Echo August 2012 with LVEF=55-60%. Carotid dopplers May 2015 with 40-59% RICA stenosis, [id number] LICA stenosis. 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  He is here today for follow up. He has had no recent chest pain, shortness of breath or palpitations. He is feeling well. Recent bout with Lyme disease.   Primary Care Physician: Arnette Norris, MD   Past Medical History:  Diagnosis Date  . CAD (coronary artery disease)    PTCA 1998 LAD-s/p overlapping DES in LAD in 05/2003  . Chest pain 5/5-5/11/2003   Hospital MI, CAD, INCR, CHOL, HTN  . Elevated glucose   . History of colonoscopy   . HTN (hypertension)   . Hyperlipidemia   . Impotence    of organic nature  . Prostate cancer [facility])    s/p prostatectomy  . Spinal disease    lumbosacral    Past Surgical History:  Procedure Laterality Date  . ANGIOPLASTY  1992  . bone spur right foot     Bilateral  . CORONARY ANGIOPLASTY  06/02/2003   PTCA, occl of vessel w/good collaterals  . LAMINECTOMY    . left shoulder decompression    . pilonidal cystectomy  late 20s  . PROSTATECTOMY    . right carpal tunnel repair  11/18/2007   Dr. Daylene Katayama    Current Outpatient Prescriptions  Medication Sig Dispense Refill  . atorvastatin (LIPITOR) 80 MG tablet TAKE 1 TABLET BY MOUTH DAILY 90 tablet 0  . calcium carbonate (OS-CAL) 600 MG TABS Take 600 mg by mouth daily.      . clopidogrel (PLAVIX) 75 MG tablet TAKE 1 TABLET BY MOUTH DAILY 90 tablet 0  . Fish Oil OIL Take 1 capsule by mouth daily.     . metoprolol  (LOPRESSOR) 50 MG tablet TAKE 1 TABLET BY MOUTH TWICE A DAY 180 tablet 0  . nitroGLYCERIN (NITROSTAT) 0.4 MG SL tablet Place 1 tablet (0.4 mg total) under the tongue every 5 (five) minutes as needed. For chest pain 26 tablet 6  . ramipril (ALTACE) 2.5 MG capsule TAKE ONE (1) CAPSULE BY MOUTH 2 TIMES DAILY 180 capsule 0   No current facility-administered medications for this visit.     Allergies  Allergen Reactions  . Doxycycline Rash  . Tetracycline Rash    Social History   Social History  . Marital status: Married    Spouse name: N/A  . Number of children: 3  . Years of education: N/A   Occupational History  . 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    Social History Main Topics  . Smoking status: Never Smoker  . Smokeless tobacco: Current User    Types: Chew  . Alcohol use 0.5 oz/week    1 Standard drinks or equivalent per week     Comment: occassionally  . Drug use: No  . Sexual activity: No   Other Topics Concern  . 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   Would desire CPR    Family History  Problem Relation Age of Onset  . Prostate cancer Father   . Cancer Father     prostate  . Colon cancer Mother   . Leukemia Mother   . Cancer Mother     colon, leukemia    Review of Systems:  As stated in the HPI and otherwise negative.   BP 114/70   Pulse 60   Ht 5\' 10"  (1.778 m)   Wt 212 lb (96.2 kg)   BMI 30.42 kg/m   Physical Examination: General: Well developed, well nourished, NAD  HEENT: OP clear, mucus membranes moist  SKIN: warm, dry. No rashes. Neuro: No focal deficits  Musculoskeletal: Muscle strength 5/5 all ext  Psychiatric: Mood and affect normal  Neck: No JVD, no carotid bruits, no thyromegaly, no lymphadenopathy.  Lungs:Clear bilaterally, no wheezes, rhonci,  crackles Cardiovascular: Regular rate and rhythm. No murmurs, gallops or rubs. Abdomen:Soft. Bowel sounds present. Non-tender.  Extremities: No lower extremity edema. Pulses are 2 + in the bilateral DP/PT.  EKG:  EKG is NSR, rate ordered today. The ekg ordered today demonstrates   Recent Labs: 09/28/2015: ALT 24; BUN 12; Creatinine, Ser 0.84; Hemoglobin 13.4; Platelets 181.0; Potassium 4.5; Sodium 138   Lipid Panel    Component Value Date/Time   CHOL 125 01/31/2015 0748   TRIG 126 01/31/2015 0748   HDL 35 (L) 01/31/2015 0748   CHOLHDL 3.6 01/31/2015 0748   VLDL 25 01/31/2015 0748   LDLCALC 65 01/31/2015 0748     Wt Readings from Last 3 Encounters:  03/24/16 212 lb (96.2 kg)  09/28/15 209 lb 12 oz (95.1 kg)  04/03/15 212 lb (96.2 kg)     Other studies Reviewed: Additional studies/ records that were reviewed today include: . Review of the above records demonstrates:   Assessment and Plan:   1. CAD: Stable. He is on good therapy. Continue ASA/Plavix/beta blocker/Ace-inh/Lipitor. Will arrange echo to assess LV function.   2. HTN: BP well controlled. No changes today.   3. HLD: Continue statin. Lipids well controlled. Repeat lipids and LFTS now  4. Carotid artery disease: Mild to moderate by dopplers May 2015 Repeat carotid artery doppler now.   5. PVCs: Continue beta blocker. No awareness of palpitations.   Current medicines are reviewed at length with the patient today.  The patient does not have concerns regarding medicines.  The following changes have been made:  no change  Labs/ tests ordered today include:   Orders Placed This Encounter  Procedures  . Lipid Profile  . Hepatic function panel  . EKG 12-Lead  . ECHOCARDIOGRAM COMPLETE     Disposition:   FU with me in 12  months   Signed, Lauree Chandler, MD 03/24/2016 9:46 AM    [facility] [place], [place], [place]  [address] Phone: [phone]; Fax: [phone]

## 2016-03-24 ENCOUNTER — Encounter: Payer: Self-pay | Admitting: Cardiovascular Disease

## 2016-03-24 ENCOUNTER — Ambulatory Visit (INDEPENDENT_AMBULATORY_CARE_PROVIDER_SITE_OTHER): Payer: Medicare Other | Admitting: Cardiovascular Disease

## 2016-03-24 VITALS — BP 114/70 | HR 60 | Ht 70.0 in | Wt 212.0 lb

## 2016-03-24 DIAGNOSIS — I493 Ventricular premature depolarization: Secondary | ICD-10-CM | POA: Diagnosis not present

## 2016-03-24 DIAGNOSIS — I2510 Atherosclerotic heart disease of native coronary artery without angina pectoris: Secondary | ICD-10-CM

## 2016-03-24 DIAGNOSIS — E7800 Pure hypercholesterolemia, unspecified: Secondary | ICD-10-CM | POA: Diagnosis not present

## 2016-03-24 DIAGNOSIS — I779 Disorder of arteries and arterioles, unspecified: Secondary | ICD-10-CM | POA: Diagnosis not present

## 2016-03-24 DIAGNOSIS — I10 Essential (primary) hypertension: Secondary | ICD-10-CM

## 2016-03-24 DIAGNOSIS — I739 Peripheral vascular disease, unspecified: Secondary | ICD-10-CM

## 2016-03-24 NOTE — Patient Instructions (Signed)
Medication Instructions:  Your physician recommends that you continue on your current medications as directed. Please refer to the Current Medication list given to you today.   Labwork: Your physician recommends that you return for lab work on day of echo.  This will be fasting.    Testing/Procedures: Your physician has requested that you have an echocardiogram. Echocardiography is a painless test that uses sound waves to create images of your heart. It provides your doctor with information about the size and shape of your heart and how well your heart's chambers and valves are working. This procedure takes approximately one hour. There are no restrictions for this procedure.  Your physician has requested that you have a carotid duplex. This test is an ultrasound of the carotid arteries in your neck. It looks at blood flow through these arteries that supply the brain with blood. Allow one hour for this exam. There are no restrictions or special instructions.       Follow-Up: Your physician recommends that you schedule a follow-up appointment in: 12 months.  Please call our office in about 9 months to schedule this appointment.     Any Other Special Instructions Will Be Listed Below (If Applicable).     If you need a refill on your cardiac medications before your next appointment, please call your pharmacy.

## 2016-04-02 ENCOUNTER — Ambulatory Visit (HOSPITAL_COMMUNITY)
Admission: RE | Admit: 2016-04-02 | Discharge: 2016-04-02 | Disposition: A | Discharge status: Home / Self Care | Payer: Medicare Other | Source: Ambulatory Visit | Attending: Cardiovascular Disease | Admitting: Cardiovascular Disease

## 2016-04-02 DIAGNOSIS — I2510 Atherosclerotic heart disease of native coronary artery without angina pectoris: Secondary | ICD-10-CM | POA: Insufficient documentation

## 2016-04-02 DIAGNOSIS — I10 Essential (primary) hypertension: Secondary | ICD-10-CM | POA: Insufficient documentation

## 2016-04-02 DIAGNOSIS — E785 Hyperlipidemia, unspecified: Secondary | ICD-10-CM | POA: Diagnosis not present

## 2016-04-02 DIAGNOSIS — I779 Disorder of arteries and arterioles, unspecified: Secondary | ICD-10-CM

## 2016-04-02 DIAGNOSIS — I6523 Occlusion and stenosis of bilateral carotid arteries: Secondary | ICD-10-CM

## 2016-04-02 DIAGNOSIS — I739 Peripheral vascular disease, unspecified: Secondary | ICD-10-CM

## 2016-04-02 DIAGNOSIS — Z720 Tobacco use: Secondary | ICD-10-CM | POA: Diagnosis not present

## 2016-04-08 ENCOUNTER — Ambulatory Visit (HOSPITAL_COMMUNITY): Payer: Medicare Other

## 2016-04-08 ENCOUNTER — Other Ambulatory Visit: Payer: Medicare Other

## 2016-04-21 ENCOUNTER — Encounter: Payer: Self-pay | Admitting: Family Medicine

## 2016-04-21 ENCOUNTER — Ambulatory Visit (INDEPENDENT_AMBULATORY_CARE_PROVIDER_SITE_OTHER): Payer: Medicare Other | Admitting: Family Medicine

## 2016-04-21 VITALS — BP 146/82 | HR 58 | Temp 98.1°F | Ht 71.0 in | Wt 231.0 lb

## 2016-04-21 DIAGNOSIS — J069 Acute upper respiratory infection, unspecified: Secondary | ICD-10-CM | POA: Diagnosis not present

## 2016-04-21 MED ORDER — AMOXICILLIN 875 MG PO TABS: 875.0000 mg | ORAL_TABLET | Freq: Two times a day (BID) | ORAL | 0 refills | Status: DC

## 2016-04-21 NOTE — Progress Notes (Signed)
SUBJECTIVE:  [name] is a 83 y.o. male who complains of productive cough, headache and bilateral ear pain for 8 days. He denies a history of anorexia and chest pain and denies a history of asthma. Patient denies smoke cigarettes.   Current Outpatient Prescriptions on File Prior to Visit  Medication Sig Dispense Refill  . atorvastatin (LIPITOR) 80 MG tablet TAKE 1 TABLET BY MOUTH DAILY 90 tablet 0  . calcium carbonate (OS-CAL) 600 MG TABS Take 600 mg by mouth daily.      . clopidogrel (PLAVIX) 75 MG tablet TAKE 1 TABLET BY MOUTH DAILY 90 tablet 0  . Fish Oil OIL Take 1 capsule by mouth daily.     . metoprolol (LOPRESSOR) 50 MG tablet TAKE 1 TABLET BY MOUTH TWICE A DAY 180 tablet 0  . nitroGLYCERIN (NITROSTAT) 0.4 MG SL tablet Place 1 tablet (0.4 mg total) under the tongue every 5 (five) minutes as needed. For chest pain 26 tablet 6  . ramipril (ALTACE) 2.5 MG capsule TAKE ONE (1) CAPSULE BY MOUTH 2 TIMES DAILY 180 capsule 0   No current facility-administered medications on file prior to visit.     Allergies  Allergen Reactions  . Doxycycline Rash  . Tetracycline Rash    Past Medical History:  Diagnosis Date  . CAD (coronary artery disease)    PTCA 1998 LAD-s/p overlapping DES in LAD in 05/2003  . Chest pain 5/5-5/11/2003   Hospital MI, CAD, INCR, CHOL, HTN  . Elevated glucose   . History of colonoscopy   . HTN (hypertension)   . Hyperlipidemia   . Impotence    of organic nature  . Prostate cancer [facility])    s/p prostatectomy  . Spinal disease    lumbosacral    Past Surgical History:  Procedure Laterality Date  . ANGIOPLASTY  1992  . bone spur right foot     Bilateral  . CORONARY ANGIOPLASTY  06/02/2003   PTCA, occl of vessel w/good collaterals  . LAMINECTOMY    . left shoulder decompression    . pilonidal cystectomy  late 20s  . PROSTATECTOMY    . right carpal tunnel repair  11/18/2007   Dr. Daylene Katayama    Family History  Problem Relation Age of Onset  . Prostate  cancer Father   . Cancer Father     prostate  . Colon cancer Mother   . Leukemia Mother   . Cancer Mother     colon, leukemia    Social History   Social History  . Marital status: Married    Spouse name: N/A  . Number of children: 3  . Years of education: N/A   Occupational History  . 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    Social History Main Topics  . Smoking status: Never Smoker  . Smokeless tobacco: Current User    Types: Chew  . Alcohol use 0.5 oz/week    1 Standard drinks or equivalent per week     Comment: occassionally  . Drug use: No  . Sexual activity: No   Other Topics Concern  . 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   Would desire CPR   The PMH, PSH, Social History, Family History, Medications, and allergies have been reviewed in [facility], and have  been updated if relevant.  OBJECTIVE:  BP (!) 146/82   Pulse (!) 58   Temp 98.1 F (36.7 C)   Ht 5\' 11"  (1.803 m)   Wt 231 lb (104.8 kg)   SpO2 98%   BMI 32.22 kg/m   He appears well, vital signs are as noted. Ears- TM bulding left.  Throat and pharynx normal.  Neck supple. No adenopathy in the neck. Nose is congested. Sinuses tender. The chest is clear, without wheezes or rales.  ASSESSMENT:  Sinusitis/otitis  PLAN: Given duration and progression of symptoms, will treat for bacterial sinusitis with amoxicillin.  Symptomatic therapy suggested: push fluids, rest and return office visit prn if symptoms persist or worsen. Lack of antibiotic effectiveness discussed with him. Call or return to clinic prn if these symptoms worsen or fail to improve as anticipated.

## 2016-04-21 NOTE — Patient Instructions (Signed)
Great to see you. Please take amoxicillin as directed- 1 tablet twice daily for 10 days. Continue taking as robitussin and mucinex.

## 2016-04-24 ENCOUNTER — Other Ambulatory Visit: Payer: Self-pay

## 2016-04-24 ENCOUNTER — Other Ambulatory Visit: Payer: Medicare Other | Admitting: *Deleted

## 2016-04-24 ENCOUNTER — Ambulatory Visit (HOSPITAL_COMMUNITY): Payer: Medicare Other | Attending: Cardiovascular Disease

## 2016-04-24 DIAGNOSIS — I2510 Atherosclerotic heart disease of native coronary artery without angina pectoris: Secondary | ICD-10-CM | POA: Diagnosis not present

## 2016-04-24 DIAGNOSIS — I071 Rheumatic tricuspid insufficiency: Secondary | ICD-10-CM | POA: Diagnosis not present

## 2016-04-24 DIAGNOSIS — I252 Old myocardial infarction: Secondary | ICD-10-CM | POA: Diagnosis not present

## 2016-04-24 DIAGNOSIS — I10 Essential (primary) hypertension: Secondary | ICD-10-CM | POA: Insufficient documentation

## 2016-04-24 DIAGNOSIS — E785 Hyperlipidemia, unspecified: Secondary | ICD-10-CM | POA: Diagnosis not present

## 2016-04-24 DIAGNOSIS — Z955 Presence of coronary angioplasty implant and graft: Secondary | ICD-10-CM | POA: Diagnosis not present

## 2016-04-24 DIAGNOSIS — I340 Nonrheumatic mitral (valve) insufficiency: Secondary | ICD-10-CM | POA: Diagnosis not present

## 2016-04-24 DIAGNOSIS — I371 Nonrheumatic pulmonary valve insufficiency: Secondary | ICD-10-CM | POA: Insufficient documentation

## 2016-04-24 NOTE — Addendum Note (Signed)
Addended by: Eulis Foster on: 04/24/2016 07:34 AM   Modules accepted: Orders

## 2016-04-25 ENCOUNTER — Other Ambulatory Visit: Payer: Self-pay | Admitting: *Deleted

## 2016-04-25 DIAGNOSIS — E7849 Other hyperlipidemia: Secondary | ICD-10-CM

## 2016-04-25 DIAGNOSIS — I2510 Atherosclerotic heart disease of native coronary artery without angina pectoris: Secondary | ICD-10-CM

## 2016-04-25 DIAGNOSIS — I10 Essential (primary) hypertension: Secondary | ICD-10-CM

## 2016-04-25 LAB — HEPATIC FUNCTION PANEL
ALBUMIN: 4.2 g/dL (ref 3.5–4.7)
ALT: 22 IU/L (ref 0–44)
AST: 17 IU/L (ref 0–40)
Alkaline Phosphatase: 88 IU/L (ref 39–117)
BILIRUBIN TOTAL: 0.5 mg/dL (ref 0.0–1.2)
Bilirubin, Direct: 0.15 mg/dL (ref 0.00–0.40)
TOTAL PROTEIN: 6.4 g/dL (ref 6.0–8.5)

## 2016-04-25 LAB — LIPID PANEL
CHOL/HDL RATIO: 3.9 ratio (ref 0.0–5.0)
Cholesterol, Total: 120 mg/dL (ref 100–199)
HDL: 31 mg/dL — ABNORMAL LOW (ref >39)
LDL CALC: 67 mg/dL (ref 0–99)
Triglycerides: 110 mg/dL (ref 0–149)
VLDL Cholesterol Cal: 22 mg/dL (ref 5–40)

## 2016-04-25 MED ORDER — METOPROLOL TARTRATE 50 MG PO TABS: 50.0000 mg | ORAL_TABLET | Freq: Two times a day (BID) | ORAL | 3 refills | Status: DC

## 2016-04-25 MED ORDER — CLOPIDOGREL BISULFATE 75 MG PO TABS: 75.0000 mg | ORAL_TABLET | Freq: Every day | ORAL | 3 refills | Status: DC

## 2016-04-25 MED ORDER — RAMIPRIL 2.5 MG PO CAPS
ORAL_CAPSULE | ORAL | 3 refills | Status: DC
Start: 2016-04-25 — End: 2017-05-11

## 2016-04-25 MED ORDER — ATORVASTATIN CALCIUM 80 MG PO TABS: 80.0000 mg | ORAL_TABLET | Freq: Every day | ORAL | 3 refills | Status: DC

## 2016-04-25 MED ORDER — NITROGLYCERIN 0.4 MG SL SUBL: 0.4000 mg | SUBLINGUAL_TABLET | SUBLINGUAL | 6 refills | Status: AC | PRN

## 2016-06-03 ENCOUNTER — Encounter: Payer: Self-pay | Admitting: Family Medicine

## 2016-06-03 ENCOUNTER — Ambulatory Visit (INDEPENDENT_AMBULATORY_CARE_PROVIDER_SITE_OTHER): Payer: Medicare Other | Admitting: Family Medicine

## 2016-06-03 DIAGNOSIS — H6690 Otitis media, unspecified, unspecified ear: Secondary | ICD-10-CM | POA: Diagnosis not present

## 2016-06-03 MED ORDER — AMOXICILLIN-POT CLAVULANATE 875-125 MG PO TABS: 1.0000 | ORAL_TABLET | Freq: Two times a day (BID) | ORAL | 0 refills | Status: DC

## 2016-06-03 NOTE — Progress Notes (Signed)
About 2 months ago he was loading corn in a grain bin.  He had sig dust exposure.  He had cough and head congestion at that point.  Saw PCP at that point, started on amoxil at that point.  He was getting some better.  Was recently in [place] on vacation.  Then cough got worse again, intermittently on the trip.  Travelled back yesterday.  Some cough and congestion on the plane yesterday.  "My head feels like it is going to pop."  ST.  Runny nose, not severe.  Still with some cough.  No known fevers.  Not SOB.  He has been getting worse over the last 4-5 days.  Hearing is muffled.  No vomiting or diarrhea.    Meds, vitals, and allergies reviewed.   ROS: Per HPI unless specifically indicated in ROS section   GEN: nad, alert and oriented HEENT: mucous membranes moist, R tm w/o erythema, L TM red, nasal exam w/o erythema, clear discharge noted,  OP with cobblestoning, sinuses not ttp NECK: supple w/o LA CV: rrr.   PULM: ctab, no inc wob EXT: no edema

## 2016-06-03 NOTE — Assessment & Plan Note (Signed)
L AOM.  D/w pt.  augmentin, rest and fluids, see AVS.  He agrees.  Nontoxic.

## 2016-06-03 NOTE — Progress Notes (Signed)
Pre visit review using our clinic review tool, if applicable. No additional management support is needed unless otherwise documented below in the visit note. 

## 2016-06-03 NOTE — Patient Instructions (Signed)
Rest and fluids, tylenol as needed for pain, start augmentin.  Update [place] as needed.  Take care.  Glad to see you.

## 2016-06-12 ENCOUNTER — Encounter: Payer: Self-pay | Admitting: Family Medicine

## 2016-06-12 ENCOUNTER — Ambulatory Visit (INDEPENDENT_AMBULATORY_CARE_PROVIDER_SITE_OTHER): Payer: Medicare Other | Admitting: Family Medicine

## 2016-06-12 DIAGNOSIS — H6690 Otitis media, unspecified, unspecified ear: Secondary | ICD-10-CM

## 2016-06-12 MED ORDER — PREDNISONE 20 MG PO TABS: 20.0000 mg | ORAL_TABLET | Freq: Every day | ORAL | 0 refills | Status: DC

## 2016-06-12 MED ORDER — AMOXICILLIN-POT CLAVULANATE 875-125 MG PO TABS: 1.0000 | ORAL_TABLET | Freq: Two times a day (BID) | ORAL | 0 refills | Status: DC

## 2016-06-12 NOTE — Patient Instructions (Addendum)
The spots on your back are seborrheic keratoses and they are not skin cancer.  They are age related and common.  Usually we just leave them alone.  Continue augmentin, add on prednisone for 5 days.  If still not better after the prednisone, then start using flonase. You can get that OTC.  2 sprays per nostril per day.  Update [place] as needed.  Take care.  Glad to see you.

## 2016-06-12 NOTE — Progress Notes (Signed)
Seen about 10 days ago.  URI sx at that point.  L TM was red and he was stated on augmentin.  Still with intermittent cough, but not as severe as prev.  Has one dose of augmentin left.  Still with stopped up head and dec in hearing B.  No fevers.  No vomiting.  He isn't worse but not better than last week.    Skin lesion on the back.  Not itchy.  Not painful.  Wife noticed it.    Meds, vitals, and allergies reviewed.   ROS: Per HPI unless specifically indicated in ROS section   GEN: nad, alert and oriented HEENT: mucous membranes moist, R TM not red, L tm w/ less erythema than prev, no B TM movement on valsalva, nasal exam w/o erythema, clear discharge noted,  OP with cobblestoning, sinuses not ttp x4 NECK: supple w/o LA CV: rrr.   PULM: ctab, no inc wob EXT: no edema SKIN: no acute rash, benign SKs noted.

## 2016-06-12 NOTE — Assessment & Plan Note (Signed)
With ETD.  L TM erythema improved, continue augmentin, add on prednisone with routine cautions, can transition to flonase if needed, update [place] needed.  occ cough but lungs CTAB.  He agrees.

## 2016-07-04 ENCOUNTER — Telehealth: Payer: Self-pay | Admitting: Family Medicine

## 2016-07-04 DIAGNOSIS — H6504 Acute serous otitis media, recurrent, right ear: Secondary | ICD-10-CM

## 2016-07-04 NOTE — Telephone Encounter (Signed)
Referral placed.

## 2016-07-04 NOTE — Telephone Encounter (Signed)
Wife called - she would like for husband to have referral to Dr Thornell Mule, since pt has had 2 ear infections recently. Please call [phone]

## 2016-07-28 NOTE — Progress Notes (Addendum)
PCP notes:   Health maintenance: No gaps identified.   Abnormal screenings:  Hearing-failed. He has upcoming appointment w/ ENT in August for recurrent ear infections.   Patient concerns: None.   Nurse concerns: None.   Next PCP appt: 08/12/16 @ 8:45am  I reviewed health advisor's note, was available for consultation on the day of service listed in this note, and agree with documentation and plan. Elsie Stain, MD.

## 2016-07-28 NOTE — Progress Notes (Signed)
Subjective:   [name] is a 83 y.o. male who presents for Medicare Annual/Subsequent preventive examination.  Review of Systems:  No ROS.  Medicare Wellness Visit. Additional risk factors are reflected in the social history.  Cardiac Risk Factors include: advanced age (>46men, >95 women);dyslipidemia;hypertension;male gender;obesity (BMI >30kg/m2)     Objective:    Vitals: BP (!) 146/68   Pulse 68   Resp 16   Ht 5' 9.5" (1.765 m)   Wt 210 lb (95.3 kg)   SpO2 98%   BMI 30.57 kg/m   Body mass index is 30.57 kg/m.  Tobacco History  Smoking Status  . Never Smoker  Smokeless Tobacco  . Current User  . Types: Chew     Ready to quit: Not Answered Counseling given: Not Answered   Past Medical History:  Diagnosis Date  . CAD (coronary artery disease)    PTCA 1998 LAD-s/p overlapping DES in LAD in 05/2003  . Chest pain 5/5-5/11/2003   Hospital MI, CAD, INCR, CHOL, HTN  . Elevated glucose   . History of colonoscopy   . HTN (hypertension)   . Hyperlipidemia   . Impotence    of organic nature  . Otitis media   . Prostate cancer [facility])    s/p prostatectomy  . Spinal disease    lumbosacral   Past Surgical History:  Procedure Laterality Date  . ANGIOPLASTY  1992  . bone spur right foot     Bilateral  . CORONARY ANGIOPLASTY  06/02/2003   PTCA, occl of vessel w/good collaterals  . LAMINECTOMY    . left shoulder decompression    . pilonidal cystectomy  late 20s  . PROSTATECTOMY    . right carpal tunnel repair  11/18/2007   Dr. Daylene Katayama   Family History  Problem Relation Age of Onset  . Prostate cancer Father   . Cancer Father        prostate  . Colon cancer Mother   . Leukemia Mother   . Cancer Mother        colon, leukemia   History  Sexual Activity  . Sexual activity: No    Outpatient Encounter Prescriptions as of 08/05/2016  Medication Sig  . atorvastatin (LIPITOR) 80 MG tablet Take 1 tablet (80 mg total) by mouth daily.  . calcium carbonate  (OS-CAL) 600 MG TABS Take 600 mg by mouth daily.    . clopidogrel (PLAVIX) 75 MG tablet Take 1 tablet (75 mg total) by mouth daily.  . Fish Oil OIL Take 1 capsule by mouth daily.   . metoprolol (LOPRESSOR) 50 MG tablet Take 1 tablet (50 mg total) by mouth 2 (two) times daily.  . nitroGLYCERIN (NITROSTAT) 0.4 MG SL tablet Place 1 tablet (0.4 mg total) under the tongue every 5 (five) minutes as needed. For chest pain  . ramipril (ALTACE) 2.5 MG capsule TAKE ONE (1) CAPSULE BY MOUTH 2 TIMES DAILY  . [DISCONTINUED] amoxicillin-clavulanate (AUGMENTIN) 875-125 MG tablet Take 1 tablet by mouth 2 (two) times daily. (Patient not taking: Reported on 08/05/2016)  . [DISCONTINUED] predniSONE (DELTASONE) 20 MG tablet Take 1 tablet (20 mg total) by mouth daily with breakfast. (Patient not taking: Reported on 08/05/2016)   No facility-administered encounter medications on file as of 08/05/2016.     Activities of Daily Living In your present state of health, do you have any difficulty performing the following activities: 08/05/2016  Hearing? Y  Vision? N  Difficulty concentrating or making decisions? N  Walking or climbing  stairs? N  Dressing or bathing? N  Doing errands, shopping? N  Preparing Food and eating ? N  Using the Toilet? N  In the past six months, have you accidently leaked urine? N  Do you have problems with loss of bowel control? N  Managing your Medications? N  Managing your Finances? N  Housekeeping or managing your Housekeeping? N  Some recent data might be hidden    Patient Care Team: Lucille Passy, MD as PCP - General (Family Medicine) Burnell Blanks, MD as Consulting Physician (Cardiology) Sharyne Peach, MD as Consulting Physician (Ophthalmology) Vicie Mutters, MD as Consulting Physician (Otolaryngology)   Assessment:    Physical assessment deferred to PCP.  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      Fall Risk Fall Risk  08/05/2016 04/03/2015 04/03/2015  Falls in the past year? No No No   Depression Screen PHQ 2/9 Scores 08/05/2016 04/03/2015 04/03/2015  PHQ - 2 Score 0 0 0    Cognitive Function MMSE - Mini Mental State Exam 08/05/2016  Orientation to time 5  Orientation to Place 5  Registration 3  Attention/ Calculation 0  Recall 3  Language- name 2 objects 0  Language- repeat 1  Language- follow 3 step command 3  Language- read & follow direction 0  Write a sentence 0  Copy design 0  Total score 20       PLEASE NOTE: A Mini-Cog screen was completed. Maximum score is 20. A value of 0 denotes this part of Folstein MMSE was not completed or the patient failed this part of the Mini-Cog screening.   Mini-Cog Screening Orientation to Time - Max 5 pts Orientation to Place - Max 5 pts Registration - Max 3 pts Recall - Max 3 pts Language Repeat - Max 1 pts Language Follow 3 Step Command - Max 3 pts  Immunization History  Administered Date(s) Administered  . Influenza Split 10/18/2010  . Influenza Whole 11/13/2005, 10/28/2007, 11/02/2008, 11/21/2009  . Influenza,inj,Quad PF,36+ Mos 10/06/2013  . Pneumococcal Conjugate-13 04/03/2015  . Pneumococcal Polysaccharide-23 12/21/2002  . Td 04/28/1998, 05/02/2008  . Tdap 10/06/2014  . Zoster 01/05/2007   Screening Tests Health Maintenance  Topic Date Due  . INFLUENZA VACCINE  08/27/2016  . TETANUS/TDAP  10/05/2024  . PNA vac Low Risk Adult  Completed      Plan:    Follow-up w/ PCP as scheduled.  I have personally reviewed and noted the following in the patient's chart:   . Medical and social history . Use of alcohol, tobacco or illicit drugs  . Current medications and  supplements . Functional ability and status . Nutritional status . Physical activity . Advanced directives . List of other physicians . Vitals . Screenings to include cognitive, depression, and falls . Referrals and appointments  In addition, I have reviewed and discussed with patient certain preventive protocols, quality metrics, and best practice recommendations. A written personalized care plan for preventive services as well as general preventive health recommendations were provided to patient.     Dorrene German, RN  08/05/2016

## 2016-08-01 ENCOUNTER — Other Ambulatory Visit: Payer: Self-pay | Admitting: Family Medicine

## 2016-08-01 DIAGNOSIS — I10 Essential (primary) hypertension: Secondary | ICD-10-CM

## 2016-08-01 DIAGNOSIS — Z0000 Encounter for general adult medical examination without abnormal findings: Secondary | ICD-10-CM

## 2016-08-01 DIAGNOSIS — E782 Mixed hyperlipidemia: Secondary | ICD-10-CM

## 2016-08-05 ENCOUNTER — Ambulatory Visit (INDEPENDENT_AMBULATORY_CARE_PROVIDER_SITE_OTHER): Payer: Medicare Other

## 2016-08-05 ENCOUNTER — Other Ambulatory Visit (INDEPENDENT_AMBULATORY_CARE_PROVIDER_SITE_OTHER): Payer: Medicare Other

## 2016-08-05 VITALS — BP 146/68 | HR 68 | Resp 16 | Ht 69.5 in | Wt 210.0 lb

## 2016-08-05 DIAGNOSIS — Z0000 Encounter for general adult medical examination without abnormal findings: Secondary | ICD-10-CM

## 2016-08-05 DIAGNOSIS — E782 Mixed hyperlipidemia: Secondary | ICD-10-CM | POA: Diagnosis not present

## 2016-08-05 LAB — COMPREHENSIVE METABOLIC PANEL
ALK PHOS: 74 U/L (ref 39–117)
ALT: 24 U/L (ref 0–53)
AST: 18 U/L (ref 0–37)
Albumin: 4.3 g/dL (ref 3.5–5.2)
BUN: 11 mg/dL (ref 6–23)
CALCIUM: 9.8 mg/dL (ref 8.4–10.5)
CO2: 28 mEq/L (ref 19–32)
Chloride: 105 mEq/L (ref 96–112)
Creatinine, Ser: 0.87 mg/dL (ref 0.40–1.50)
GFR: 89.36 mL/min (ref >60.00)
GLUCOSE: 132 mg/dL — AB (ref 70–99)
POTASSIUM: 5.1 meq/L (ref 3.5–5.1)
Sodium: 141 mEq/L (ref 135–145)
TOTAL PROTEIN: 6.7 g/dL (ref 6.0–8.3)
Total Bilirubin: 0.9 mg/dL (ref 0.2–1.2)

## 2016-08-05 LAB — LIPID PANEL
CHOL/HDL RATIO: 3
Cholesterol: 142 mg/dL (ref 0–200)
HDL: 46.3 mg/dL (ref >39.00)
LDL Cholesterol: 73 mg/dL (ref 0–99)
NonHDL: 95.59
TRIGLYCERIDES: 112 mg/dL (ref 0.0–149.0)
VLDL: 22.4 mg/dL (ref 0.0–40.0)

## 2016-08-05 LAB — PSA: PSA: 0.02 ng/mL — ABNORMAL LOW (ref 0.10–4.00)

## 2016-08-05 NOTE — Patient Instructions (Signed)
[name] , Thank you for taking time to come for your Medicare Wellness Visit. I appreciate your ongoing commitment to your health goals. Please review the following plan we discussed and let me know if I can assist you in the future.   These are the goals we discussed: Goals    . 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       This is a list of the screening recommended for you and due dates:  Health Maintenance  Topic Date Due  . Flu Shot  08/27/2016  . Tetanus Vaccine  10/05/2024  . Pneumonia vaccines  Completed  ' Preventive Care for Adults  A healthy lifestyle and preventive care can promote health and wellness. Preventive health guidelines for adults include the following key practices.  . A routine yearly physical is a good way to check with your health care provider about your health and preventive screening. It is a chance to share any concerns and updates on your health and to receive a thorough exam.  . Visit your dentist for a routine exam and preventive care every 6 months. Brush your teeth twice a day and floss once a day. Good oral hygiene prevents tooth decay and gum disease.  . The frequency of eye exams is based on your age, health, family medical history, use  of contact lenses, and other factors. Follow your health care provider's ecommendations for frequency of eye exams.  . Eat a healthy diet. Foods like vegetables, fruits, whole grains, low-fat dairy products, and lean protein foods contain the nutrients you need without too many calories. Decrease your intake of foods high in solid fats, added sugars, and salt. Eat the right amount of calories for you. Get information about a proper diet from your health care provider, if necessary.  . Regular physical exercise is one of the most important things you can do for your health. Most  adults should get at least 150 minutes of moderate-intensity exercise (any activity that increases your heart rate and causes you to sweat) each week. In addition, most adults need muscle-strengthening exercises on 2 or more days a week.  Silver Sneakers may be a benefit available to you. To determine eligibility, you may visit the website: www.silversneakers.com or contact program at 786-675-7979 Mon-Fri between 8AM-8PM.   . Maintain a healthy weight. The body mass index (BMI) is a screening tool to identify possible weight problems. It provides an estimate of body fat based on height and weight. Your health care provider can find your BMI and can help you achieve or maintain a healthy weight.   For adults 20 years and older: ? A BMI below 18.5 is considered underweight. ? A BMI of 18.5 to 24.9 is normal. ? A BMI of 25 to 29.9 is considered overweight. ? A BMI of 30 and above is considered obese.   . Maintain normal blood lipids and cholesterol levels by exercising and minimizing your intake of saturated fat. Eat a balanced diet with plenty of fruit and vegetables. Blood tests for lipids and cholesterol should begin at age 52 and be repeated every 5 years. If your lipid or cholesterol levels are high, you are over 50, or you are at high risk for heart disease, you may need your cholesterol levels checked more frequently. Ongoing high lipid and cholesterol levels  should be treated with medicines if diet and exercise are not working.  . If you smoke, find out from your health care provider how to quit. If you do not use tobacco, please do not start.  . If you choose to drink alcohol, please do not consume more than 2 drinks per day. One drink is considered to be 12 ounces (355 mL) of beer, 5 ounces (148 mL) of wine, or 1.5 ounces (44 mL) of liquor.  . If you are 18-19 years old, ask your health care provider if you should take aspirin to prevent strokes.  . Use sunscreen. Apply sunscreen  liberally and repeatedly throughout the day. You should seek shade when your shadow is shorter than you. Protect yourself by wearing long sleeves, pants, a wide-brimmed hat, and sunglasses year round, whenever you are outdoors.  . Once a month, do a whole body skin exam, using a mirror to look at the skin on your back. Tell your health care provider of new moles, moles that have irregular borders, moles that are larger than a pencil eraser, or moles that have changed in shape or color.

## 2016-08-12 ENCOUNTER — Encounter: Payer: Self-pay | Admitting: Family Medicine

## 2016-08-12 ENCOUNTER — Ambulatory Visit (INDEPENDENT_AMBULATORY_CARE_PROVIDER_SITE_OTHER): Payer: Medicare Other | Admitting: Family Medicine

## 2016-08-12 VITALS — BP 140/60 | HR 65 | Temp 98.3°F | Ht 70.0 in | Wt 210.0 lb

## 2016-08-12 DIAGNOSIS — H6980 Other specified disorders of Eustachian tube, unspecified ear: Secondary | ICD-10-CM | POA: Diagnosis not present

## 2016-08-12 DIAGNOSIS — Z7189 Other specified counseling: Secondary | ICD-10-CM

## 2016-08-12 DIAGNOSIS — R739 Hyperglycemia, unspecified: Secondary | ICD-10-CM | POA: Diagnosis not present

## 2016-08-12 DIAGNOSIS — I10 Essential (primary) hypertension: Secondary | ICD-10-CM

## 2016-08-12 DIAGNOSIS — E782 Mixed hyperlipidemia: Secondary | ICD-10-CM | POA: Diagnosis not present

## 2016-08-12 DIAGNOSIS — Z0000 Encounter for general adult medical examination without abnormal findings: Secondary | ICD-10-CM

## 2016-08-12 NOTE — Progress Notes (Signed)
Abnormal screenings:  Hearing-failed. He has upcoming appointment w/ ENT.  He hasn't tried flonase yet.  He says he can pop his ears with valsalva but that doesn't seem to help.  Shingles shot d/w pt.  See AVS.   Colonoscopy deferred given age, d/w pt.  PSA still low, d/w pt. Living will d/w pt.  Wife designated if patient were incapacitated.   Precontemplative about quitting chew tobacco.  D/w pt.    Hyperglycemia. He had been eating more corn recently, 2 ears a night.  Labs d/w pt. Had been on prednisone earlier.    Hypertension:    Using medication without problems or lightheadedness: yes Chest pain with exertion:no Edema:no Short of breath:no No NTG use.   Elevated Cholesterol: Using medications without problems:yes Muscle aches: no Diet compliance: encouraged.  Exercise:yes, farming.  Carotid exam repeated 2018.  D/w pt.    Meds, vitals, and allergies reviewed.   PMH and SH reviewed  ROS: Per HPI unless specifically indicated in ROS section   GEN: nad, alert and oriented HEENT: mucous membranes moist, TM w/o erythema but no movement of L TM with valsalva.  NECK: supple w/o LA CV: rrr. PULM: ctab, no inc wob ABD: soft, +bs EXT: no edema SKIN: no acute rash

## 2016-08-12 NOTE — Patient Instructions (Addendum)
Check with your insurance to see if they will cover the shingrix shot. Cut back on sweets and corn.  Recheck labs in about 3 months, nonfasting.  Get a lab visit on the way out.  Don't change your meds for now.  Take care.  Glad to see you.

## 2016-08-13 DIAGNOSIS — Z0000 Encounter for general adult medical examination without abnormal findings: Secondary | ICD-10-CM | POA: Insufficient documentation

## 2016-08-13 DIAGNOSIS — E119 Type 2 diabetes mellitus without complications: Secondary | ICD-10-CM | POA: Insufficient documentation

## 2016-08-13 DIAGNOSIS — Z8639 Personal history of other endocrine, nutritional and metabolic disease: Secondary | ICD-10-CM | POA: Insufficient documentation

## 2016-08-13 DIAGNOSIS — H6980 Other specified disorders of Eustachian tube, unspecified ear: Secondary | ICD-10-CM | POA: Insufficient documentation

## 2016-08-13 DIAGNOSIS — Z7189 Other specified counseling: Secondary | ICD-10-CM

## 2016-08-13 DIAGNOSIS — G8929 Other chronic pain: Secondary | ICD-10-CM | POA: Insufficient documentation

## 2016-08-13 NOTE — Assessment & Plan Note (Signed)
Reasonable control. Labs discussed with patient. No nitroglycerin use. Continue as is. He agrees. >25 minutes spent in face to face time with patient, >50% spent in counselling or coordination of care.

## 2016-08-13 NOTE — Assessment & Plan Note (Signed)
He'll f/u with ENT.  He still has at least a component of ETD on the exam today.  D/w pt.

## 2016-08-13 NOTE — Assessment & Plan Note (Signed)
Hearing-failed. He has upcoming appointment w/ ENT.  He hasn't tried flonase yet.  He says he can pop his ears with valsalva but that doesn't seem to help.  Shingles shot d/w pt.  See AVS.   Colonoscopy deferred given age, d/w pt.  PSA still low, d/w pt. Living will d/w pt.  Wife designated if patient were incapacitated.   Precontemplative about quitting chew tobacco.  D/w pt.

## 2016-08-13 NOTE — Assessment & Plan Note (Signed)
He'll cut back on carbs and recheck labs in about 3 months. Discussed with patient. He agrees.

## 2016-08-13 NOTE — Assessment & Plan Note (Signed)
Labs discussed with patient. Able to tolerate statin. Continue as is, with work on diet and exercise. See above.

## 2016-08-13 NOTE — Assessment & Plan Note (Signed)
Living will d/w pt.  Wife designated if patient were incapacitated.   ?

## 2016-11-17 ENCOUNTER — Other Ambulatory Visit: Payer: Self-pay | Admitting: *Deleted

## 2016-11-17 DIAGNOSIS — I6523 Occlusion and stenosis of bilateral carotid arteries: Secondary | ICD-10-CM

## 2016-11-28 ENCOUNTER — Other Ambulatory Visit (INDEPENDENT_AMBULATORY_CARE_PROVIDER_SITE_OTHER): Payer: Medicare Other

## 2016-11-28 DIAGNOSIS — R739 Hyperglycemia, unspecified: Secondary | ICD-10-CM | POA: Diagnosis not present

## 2016-11-28 LAB — HEMOGLOBIN A1C: HEMOGLOBIN A1C: 6.3 % (ref 4.6–6.5)

## 2017-05-06 ENCOUNTER — Encounter: Payer: Self-pay | Admitting: Cardiovascular Disease

## 2017-05-11 ENCOUNTER — Encounter: Payer: Self-pay | Admitting: Cardiovascular Disease

## 2017-05-11 ENCOUNTER — Ambulatory Visit: Payer: Medicare Other | Admitting: Cardiovascular Disease

## 2017-05-11 VITALS — BP 136/66 | HR 75 | Ht 70.0 in | Wt 210.8 lb

## 2017-05-11 DIAGNOSIS — I493 Ventricular premature depolarization: Secondary | ICD-10-CM

## 2017-05-11 DIAGNOSIS — E7849 Other hyperlipidemia: Secondary | ICD-10-CM | POA: Diagnosis not present

## 2017-05-11 DIAGNOSIS — I6523 Occlusion and stenosis of bilateral carotid arteries: Secondary | ICD-10-CM

## 2017-05-11 DIAGNOSIS — I2510 Atherosclerotic heart disease of native coronary artery without angina pectoris: Secondary | ICD-10-CM

## 2017-05-11 DIAGNOSIS — I10 Essential (primary) hypertension: Secondary | ICD-10-CM | POA: Diagnosis not present

## 2017-05-11 MED ORDER — ATORVASTATIN CALCIUM 80 MG PO TABS: 80.0000 mg | ORAL_TABLET | Freq: Every day | ORAL | 3 refills | Status: DC

## 2017-05-11 MED ORDER — CLOPIDOGREL BISULFATE 75 MG PO TABS: 75.0000 mg | ORAL_TABLET | Freq: Every day | ORAL | 3 refills | Status: DC

## 2017-05-11 MED ORDER — RAMIPRIL 2.5 MG PO CAPS: ORAL_CAPSULE | ORAL | 3 refills | Status: DC

## 2017-05-11 MED ORDER — METOPROLOL TARTRATE 50 MG PO TABS: 50.0000 mg | ORAL_TABLET | Freq: Two times a day (BID) | ORAL | 3 refills | Status: DC

## 2017-05-11 NOTE — Progress Notes (Signed)
Chief Complaint  Patient presents with  . Coronary Artery Disease   History of Present Illness: 84 yo male with history of CAD, HTN, HLD, PVCs here today for cardiac follow up. In 1998 he had PTCA of the LAD. In 2005 he had a drug-eluting stent placed in the LAD and then a short time later had a second drug-eluting stent placed in the LAD overlapping the first for an edge tear. Echo August 2012 with LVEF=55-60%. He has moderate carotid artery disease. 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 He had Lyme disease in 2018. Echo March 2018 with normal LV systolic and diastolic function, mild MR.   He is here today for follow up. The patient denies any chest pain, dyspnea, palpitations, lower extremity edema, orthopnea, PND, dizziness, near syncope or syncope.   Primary Care Physician: Tonia Ghent, MD   Past Medical History:  Diagnosis Date  . CAD (coronary artery disease)    PTCA 1998 LAD-s/p overlapping DES in LAD in 05/2003  . Chest pain 5/5-5/11/2003   Hospital MI, CAD, INCR, CHOL, HTN  . Elevated glucose   . History of colonoscopy   . HTN (hypertension)   . Hyperlipidemia   . Impotence    of organic nature  . Otitis media   . Prostate cancer [facility])    s/p prostatectomy  . Spinal disease    lumbosacral    Past Surgical History:  Procedure Laterality Date  . ANGIOPLASTY  1992  . bone spur right foot     Bilateral  . CORONARY ANGIOPLASTY  06/02/2003   PTCA, occl of vessel w/good collaterals  . LAMINECTOMY    . left shoulder decompression    . pilonidal cystectomy  late 20s  . PROSTATECTOMY    . right carpal tunnel repair  11/18/2007   Dr. Daylene Katayama    Current Outpatient Medications  Medication Sig Dispense Refill  . atorvastatin (LIPITOR) 80 MG tablet Take 1 tablet (80 mg total) by mouth daily. 90 tablet 3  . calcium carbonate (OS-CAL) 600 MG TABS Take 600 mg by mouth daily.      . clopidogrel  (PLAVIX) 75 MG tablet Take 1 tablet (75 mg total) by mouth daily. 90 tablet 3  . Fish Oil OIL Take 1 capsule by mouth daily.     . metoprolol (LOPRESSOR) 50 MG tablet Take 1 tablet (50 mg total) by mouth 2 (two) times daily. 180 tablet 3  . nitroGLYCERIN (NITROSTAT) 0.4 MG SL tablet Place 1 tablet (0.4 mg total) under the tongue every 5 (five) minutes as needed. For chest pain 25 tablet 6  . ramipril (ALTACE) 2.5 MG capsule TAKE ONE (1) CAPSULE BY MOUTH 2 TIMES DAILY 180 capsule 3   No current facility-administered medications for this visit.     Allergies  Allergen Reactions  . Doxycycline Rash  . Tetracycline Rash    Social History   Socioeconomic History  . Marital status: Married    Spouse name: Not on file  . Number of children: 3  . Years of education: Not on file  . Highest education level: Not on file  Occupational History  . 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  Social Needs  . Financial resource strain: Not on file  . Food insecurity:    Worry: Not on file    Inability: Not on file  . Transportation needs:    Medical: Not  on file    Non-medical: Not on file  Tobacco Use  . Smoking status: Never Smoker  . Smokeless tobacco: Current User    Types: Chew  Substance and Sexual Activity  . Alcohol use: Yes    Alcohol/week: 0.5 oz    Types: 1 Standard drinks or equivalent per week    Comment: occassionally  . Drug use: No  . Sexual activity: Never  Lifestyle  . Physical activity:    Days per week: Not on file    Minutes per session: Not on file  . Stress: Not on file  Relationships  . Social connections:    Talks on phone: Not on file    Gets together: Not on file    Attends religious service: Not on file    Active member of club or organization: Not on file    Attends meetings of clubs or organizations: Not on file    Relationship status: Not on file  . Intimate partner violence:    Fear of current or ex partner: Not on file    Emotionally  abused: Not on file    Physically abused: Not on file    Forced sexual activity: Not on file  Other Topics Concern  . 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    2 sons, 1 daughter,    6 grandkids    Family History  Problem Relation Age of Onset  . Prostate cancer Father   . Cancer Father        prostate  . Colon cancer Mother   . Leukemia Mother   . Cancer Mother        colon, leukemia    Review of Systems:  As stated in the HPI and otherwise negative.   BP 136/66   Pulse 75   Ht 5\' 10"  (1.778 m)   Wt 210 lb 12.8 oz (95.6 kg)   SpO2 97%   BMI 30.25 kg/m   Physical Examination:  General: Well developed, well nourished, NAD  HEENT: OP clear, mucus membranes moist  SKIN: warm, dry. No rashes. Neuro: No focal deficits  Musculoskeletal: Muscle strength 5/5 all ext  Psychiatric: Mood and affect normal  Neck: No JVD, no carotid bruits, no thyromegaly, no lymphadenopathy.  Lungs:Clear bilaterally, no wheezes, rhonci, crackles Cardiovascular: Regular rate and rhythm. No murmurs, gallops or rubs. Abdomen:Soft. Bowel sounds present. Non-tender.  Extremities: No lower extremity edema. Pulses are 2 + in the bilateral DP/PT.  Echo March 2018: - Left ventricle: The cavity size was normal. Systolic function was   normal. The estimated ejection fraction was in the range of 60%   to 65%. Wall motion was normal; there were no regional wall   motion abnormalities. Left ventricular diastolic function   parameters were normal. - Mitral valve: There was mild regurgitation. - Atrial septum: No defect or patent foramen ovale was identified.  EKG:  EKG is ordered today The ekg ordered today demonstrates NSR, rate 75 bpm.   Recent Labs: 08/05/2016: ALT 24; BUN 11; Creatinine, Ser 0.87; Potassium 5.1; Sodium 141   Lipid Panel    Component Value Date/Time   CHOL  142 08/05/2016 1009   CHOL 120 04/24/2016 0734   TRIG 112.0 08/05/2016 1009   HDL 46.30 08/05/2016 1009   HDL 31 (L) 04/24/2016 0734   CHOLHDL 3 08/05/2016 1009  VLDL 22.4 08/05/2016 1009   LDLCALC 73 08/05/2016 1009   LDLCALC 67 04/24/2016 0734     Wt Readings from Last 3 Encounters:  05/11/17 210 lb 12.8 oz (95.6 kg)  08/12/16 210 lb (95.3 kg)  08/05/16 210 lb (95.3 kg)     Other studies Reviewed: Additional studies/ records that were reviewed today include: . Review of the above records demonstrates:   Assessment and Plan:   1. CAD without angina: No chest pain. LV function normal by echo in 2018. Will continue Plavix, beta blocker and statin.   2. HTN: BP is controlled. No changes today.   3. HLD: LDL is at goal. Continue statin.   4. Carotid artery disease: Stable mild to moderate carotid disease by dopplers march 2018. Repeat spring 2020.   5. PVCs: No palpitations. Continue beta blocker.   Current medicines are reviewed at length with the patient today.  The patient does not have concerns regarding medicines.  The following changes have been made:  no change  Labs/ tests ordered today include:   No orders of the defined types were placed in this encounter.    Disposition:   FU with me in 12  months   Signed, Lauree Chandler, MD 05/11/2017 3:49 PM    [address], [place], [place]  [address] Phone: [phone]; Fax: [phone]

## 2017-05-11 NOTE — Patient Instructions (Signed)

## 2017-07-10 ENCOUNTER — Ambulatory Visit: Payer: Medicare Other | Admitting: Family Medicine

## 2017-07-10 ENCOUNTER — Encounter: Payer: Self-pay | Admitting: Family Medicine

## 2017-07-10 VITALS — BP 144/64 | HR 67 | Temp 98.6°F | Ht 70.0 in | Wt 211.5 lb

## 2017-07-10 DIAGNOSIS — M5431 Sciatica, right side: Secondary | ICD-10-CM

## 2017-07-10 LAB — BASIC METABOLIC PANEL
BUN: 15 mg/dL (ref 6–23)
CHLORIDE: 105 meq/L (ref 96–112)
CO2: 30 mEq/L (ref 19–32)
Calcium: 9.8 mg/dL (ref 8.4–10.5)
Creatinine, Ser: 0.9 mg/dL (ref 0.40–1.50)
GFR: 85.73 mL/min (ref >60.00)
Glucose, Bld: 120 mg/dL — ABNORMAL HIGH (ref 70–99)
POTASSIUM: 5 meq/L (ref 3.5–5.1)
Sodium: 141 mEq/L (ref 135–145)

## 2017-07-10 MED ORDER — PREDNISONE 20 MG PO TABS: ORAL_TABLET | ORAL | 0 refills | Status: DC

## 2017-07-10 NOTE — Progress Notes (Signed)
R lower back/buttock pain.  Started about 2-3 weeks ago but getting worse.   Radiates down the R leg. No L sided sx.  H/o back surgery years ago for similar.  No trauma.  No B/B sx.  Can't sit for prolonged periods.   He had injection in the past.   No FCNAVD.  He doesn't feel unwell.   Prev MRI 2009: 1. Severe multifactorial stenosis at L4-5.   2. Degenerative disc disease and mild degenerative facet disease at L5-S1. Mild narrowing of the subarticular lateral recesses and foramina, right more than left. Definite neural compression is not demonstrated at this level however.   3. Disc bulge at L3-4 without definite neural compression. There is some potential for neural irritation in the foraminal to extraforaminal region.   4. Slightly heterogeneous marrow pattern, most commonly benign. I do not have a strong suspicion of osseous metastatic disease given this pattern.  Failed tx with nsaids.   Meds, vitals, and allergies reviewed.   ROS: Per HPI unless specifically indicated in ROS section   GEN: nad, alert and oriented HEENT: mucous membranes moist NECK: supple w/o LA CV: rrr. PULM: ctab, no inc wob EXT: no edema SKIN:  Well-perfused R SLR positive.  Able to bear weight.

## 2017-07-10 NOTE — Patient Instructions (Addendum)
Stop aleve/ibuprofen.  Take prednisone 2 a day for 5 days, then 1 a day for 5 days, with food.  Go to the lab on the way out.  We'll contact you with your lab report. We will call about your referral.  Rosaria Ferries or Azalee Course will call you if you don't see one of them on the way out.  Take care.  Glad to see you.

## 2017-07-11 NOTE — Assessment & Plan Note (Signed)
Given previous surgery, refer for MRI with and without contrast.  Check bmet today regarding his creatinine.  Stop NSAIDs.  Start prednisone with routine steroid caution.  Refer back to neurosurgery.  Update me as needed.  Okay for outpatient follow-up.  He agrees.

## 2017-07-20 ENCOUNTER — Ambulatory Visit
Admission: RE | Admit: 2017-07-20 | Discharge: 2017-07-20 | Disposition: A | Discharge status: Home / Self Care | Payer: Medicare Other | Source: Ambulatory Visit | Attending: Family Medicine | Admitting: Family Medicine

## 2017-07-20 DIAGNOSIS — M5431 Sciatica, right side: Secondary | ICD-10-CM

## 2017-07-20 MED ORDER — GADOBENATE DIMEGLUMINE 529 MG/ML IV SOLN
20.0000 mL | Freq: Once | INTRAVENOUS | Status: AC | PRN
  Administered 2017-07-20: 20 mL via INTRAVENOUS

## 2017-08-06 ENCOUNTER — Other Ambulatory Visit: Payer: Self-pay | Admitting: Family Medicine

## 2017-08-06 DIAGNOSIS — E782 Mixed hyperlipidemia: Secondary | ICD-10-CM

## 2017-08-06 DIAGNOSIS — Z125 Encounter for screening for malignant neoplasm of prostate: Secondary | ICD-10-CM

## 2017-08-06 DIAGNOSIS — R739 Hyperglycemia, unspecified: Secondary | ICD-10-CM

## 2017-08-07 ENCOUNTER — Ambulatory Visit (INDEPENDENT_AMBULATORY_CARE_PROVIDER_SITE_OTHER): Payer: Medicare Other

## 2017-08-07 ENCOUNTER — Other Ambulatory Visit: Payer: Self-pay | Admitting: Neurosurgery

## 2017-08-07 ENCOUNTER — Ambulatory Visit
Admission: RE | Admit: 2017-08-07 | Discharge: 2017-08-07 | Disposition: A | Discharge status: Home / Self Care | Payer: Medicare Other | Source: Ambulatory Visit | Attending: Neurosurgery | Admitting: Neurosurgery

## 2017-08-07 VITALS — BP 124/70 | HR 54 | Temp 98.4°F | Ht 69.5 in | Wt 207.5 lb

## 2017-08-07 DIAGNOSIS — Z125 Encounter for screening for malignant neoplasm of prostate: Secondary | ICD-10-CM | POA: Diagnosis not present

## 2017-08-07 DIAGNOSIS — M47816 Spondylosis without myelopathy or radiculopathy, lumbar region: Secondary | ICD-10-CM

## 2017-08-07 DIAGNOSIS — E782 Mixed hyperlipidemia: Secondary | ICD-10-CM | POA: Diagnosis not present

## 2017-08-07 DIAGNOSIS — R739 Hyperglycemia, unspecified: Secondary | ICD-10-CM

## 2017-08-07 DIAGNOSIS — Z0000 Encounter for general adult medical examination without abnormal findings: Secondary | ICD-10-CM

## 2017-08-07 LAB — LIPID PANEL
CHOL/HDL RATIO: 3
Cholesterol: 156 mg/dL (ref 0–200)
HDL: 46.4 mg/dL (ref >39.00)
LDL CALC: 78 mg/dL (ref 0–99)
NonHDL: 109.93
Triglycerides: 160 mg/dL — ABNORMAL HIGH (ref 0.0–149.0)
VLDL: 32 mg/dL (ref 0.0–40.0)

## 2017-08-07 LAB — COMPREHENSIVE METABOLIC PANEL
ALT: 23 U/L (ref 0–53)
AST: 16 U/L (ref 0–37)
Albumin: 4.2 g/dL (ref 3.5–5.2)
Alkaline Phosphatase: 95 U/L (ref 39–117)
BUN: 18 mg/dL (ref 6–23)
CHLORIDE: 106 meq/L (ref 96–112)
CO2: 29 meq/L (ref 19–32)
Calcium: 9.2 mg/dL (ref 8.4–10.5)
Creatinine, Ser: 0.89 mg/dL (ref 0.40–1.50)
GFR: 86.83 mL/min (ref >60.00)
Glucose, Bld: 128 mg/dL — ABNORMAL HIGH (ref 70–99)
Potassium: 4.6 mEq/L (ref 3.5–5.1)
Sodium: 141 mEq/L (ref 135–145)
Total Bilirubin: 0.6 mg/dL (ref 0.2–1.2)
Total Protein: 6.4 g/dL (ref 6.0–8.3)

## 2017-08-07 LAB — HEMOGLOBIN A1C: Hgb A1c MFr Bld: 6.6 % — ABNORMAL HIGH (ref 4.6–6.5)

## 2017-08-07 LAB — PSA, MEDICARE: PSA: 0.01 ng/ml — ABNORMAL LOW (ref 0.10–4.00)

## 2017-08-07 NOTE — Patient Instructions (Signed)
[name] , Thank you for taking time to come for your Medicare Wellness Visit. I appreciate your ongoing commitment to your health goals. Please review the following plan we discussed and let me know if I can assist you in the future.   These are the goals we discussed: Goals    . Patient Stated     Starting 08/07/2017, I will continue to take medications as prescribed.        This is a list of the screening recommended for you and due dates:  Health Maintenance  Topic Date Due  . Flu Shot  08/27/2017  . Tetanus Vaccine  10/05/2024  . Pneumonia vaccines  Completed   Preventive Care for Adults  A healthy lifestyle and preventive care can promote health and wellness. Preventive health guidelines for adults include the following key practices.  . A routine yearly physical is a good way to check with your health care provider about your health and preventive screening. It is a chance to share any concerns and updates on your health and to receive a thorough exam.  . Visit your dentist for a routine exam and preventive care every 6 months. Brush your teeth twice a day and floss once a day. Good oral hygiene prevents tooth decay and gum disease.  . The frequency of eye exams is based on your age, health, family medical history, use  of contact lenses, and other factors. Follow your health care provider's recommendations for frequency of eye exams.  . Eat a healthy diet. Foods like vegetables, fruits, whole grains, low-fat dairy products, and lean protein foods contain the nutrients you need without too many calories. Decrease your intake of foods high in solid fats, added sugars, and salt. Eat the right amount of calories for you. Get information about a proper diet from your health care provider, if necessary.  . Regular physical exercise is one of the most important things you can do for your health. Most adults should get at least 150 minutes of moderate-intensity exercise (any activity  that increases your heart rate and causes you to sweat) each week. In addition, most adults need muscle-strengthening exercises on 2 or more days a week.  Silver Sneakers may be a benefit available to you. To determine eligibility, you may visit the website: www.silversneakers.com or contact program at [phone] Mon-Fri between 8AM-8PM.   . Maintain a healthy weight. The body mass index (BMI) is a screening tool to identify possible weight problems. It provides an estimate of body fat based on height and weight. Your health care provider can find your BMI and can help you achieve or maintain a healthy weight.   For adults 20 years and older: ? A BMI below 18.5 is considered underweight. ? A BMI of 18.5 to 24.9 is normal. ? A BMI of 25 to 29.9 is considered overweight. ? A BMI of 30 and above is considered obese.   . Maintain normal blood lipids and cholesterol levels by exercising and minimizing your intake of saturated fat. Eat a balanced diet with plenty of fruit and vegetables. Blood tests for lipids and cholesterol should begin at age 91 and be repeated every 5 years. If your lipid or cholesterol levels are high, you are over 50, or you are at high risk for heart disease, you may need your cholesterol levels checked more frequently. Ongoing high lipid and cholesterol levels should be treated with medicines if diet and exercise are not working.  . If you smoke, find  out from your health care provider how to quit. If you do not use tobacco, please do not start.  . If you choose to drink alcohol, please do not consume more than 2 drinks per day. One drink is considered to be 12 ounces (355 mL) of beer, 5 ounces (148 mL) of wine, or 1.5 ounces (44 mL) of liquor.  . If you are 28-4 years old, ask your health care provider if you should take aspirin to prevent strokes.  . Use sunscreen. Apply sunscreen liberally and repeatedly throughout the day. You should seek shade when your shadow is  shorter than you. Protect yourself by wearing long sleeves, pants, a wide-brimmed hat, and sunglasses year round, whenever you are outdoors.  . Once a month, do a whole body skin exam, using a mirror to look at the skin on your back. Tell your health care provider of new moles, moles that have irregular borders, moles that are larger than a pencil eraser, or moles that have changed in shape or color.

## 2017-08-07 NOTE — Progress Notes (Signed)
PCP notes:   Health maintenance:  No gaps identified.  Abnormal screenings:   Hearing - failed  Hearing Screening   125Hz  250Hz  500Hz  1000Hz  2000Hz  3000Hz  4000Hz  6000Hz  8000Hz   Right ear:   40 40 40  40    Left ear:   40 40 40  0     Patient concerns:   Patient recently consulted with neurologist regarding right lower back and hip pain. Started on Gabapentin and has a standing X-ray today.   Nurse concerns:  None  Next PCP appt:   08/14/17 @ 945  I reviewed health advisor's note, was available for consultation on the day of service listed in this note, and agree with documentation and plan. Elsie Stain, MD.

## 2017-08-07 NOTE — Progress Notes (Signed)
Subjective:   [name] is a 84 y.o. male who presents for Medicare Annual/Subsequent preventive examination.  Review of Systems:  N/A Cardiac Risk Factors include: advanced age (>23men, >33 women);dyslipidemia;hypertension;smoking/ tobacco exposure;obesity (BMI >30kg/m2);male gender     Objective:    Vitals: BP 124/70 (BP Location: Right Arm, Patient Position: Sitting, Cuff Size: Normal)   Pulse (!) 54   Temp 98.4 F (36.9 C) (Oral)   Ht 5' 9.5" (1.765 m) Comment: no shoes  Wt 207 lb 8 oz (94.1 kg)   SpO2 96%   BMI 30.20 kg/m   Body mass index is 30.2 kg/m.  Advanced Directives 08/07/2017 08/05/2016 10/06/2014  Does Patient Have a Medical Advance Directive? Yes Yes Yes  Type of Paramedic of [place];Living will [place];Living will Living will  Copy of [place] in Chart? No - copy requested No - copy requested No - copy requested    Tobacco Social History   Tobacco Use  Smoking Status Never Smoker  Smokeless Tobacco Current User  . Types: Chew     Ready to quit: No Counseling given: No   Clinical Intake:  Pre-visit preparation completed: Yes  Pain : No/denies pain Pain Score: 0-No pain     Nutritional Status: BMI 25 -29 Overweight Nutritional Risks: None Diabetes: No  How often do you need to have someone help you when you read instructions, pamphlets, or other written materials from your doctor or pharmacy?: 1 - Never What is the last grade level you completed in school?: PhD in [employer]  Interpreter Needed?: No  Comments: pt lives with spouse Information entered by :: LPinson, LPN  Past Medical History:  Diagnosis Date  . CAD (coronary artery disease)    PTCA 1998 LAD-s/p overlapping DES in LAD in 05/2003  . Chest pain 5/5-5/11/2003   Hospital MI, CAD, INCR, CHOL, HTN  . Elevated glucose   . History of colonoscopy   . HTN (hypertension)   . Hyperlipidemia   . Impotence    of organic nature  . Otitis media   . Prostate cancer [facility])    s/p prostatectomy  . Spinal disease    lumbosacral   Past Surgical History:  Procedure Laterality Date  . ANGIOPLASTY  1992  . bone spur right foot     Bilateral  . CORONARY ANGIOPLASTY  06/02/2003   PTCA, occl of vessel w/good collaterals  . LAMINECTOMY    . left shoulder decompression    . pilonidal cystectomy  late 20s  . PROSTATECTOMY    . right carpal tunnel repair  11/18/2007   Dr. Daylene [name]   Family History  Problem Relation Age of Onset  . Prostate cancer Father   . Cancer Father        prostate  . Colon cancer Mother   . Leukemia Mother   . Cancer Mother        colon, leukemia   Social History   Socioeconomic History  . Marital status: Married    Spouse name: Not on file  . Number of children: 3  . Years of education: Not on file  . Highest education level: Not on file  Occupational History  . 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  Social Needs  . Financial resource strain: Not on file  . Food insecurity:    Worry: Not on file    Inability: Not on file  . Transportation needs:    Medical: Not on file  Non-medical: Not on file  Tobacco Use  . Smoking status: Never Smoker  . Smokeless tobacco: Current User    Types: Chew  Substance and Sexual Activity  . Alcohol use: Yes    Alcohol/week: 1.2 - 1.8 oz    Types: 2 - 3 Glasses of wine per week    Comment: occassionally  . Drug use: No  . Sexual activity: Never  Lifestyle  . Physical activity:    Days per week: Not on file    Minutes per session: Not on file  . Stress: Not on file  Relationships  . Social connections:    Talks on phone: Not on file    Gets together: Not on file    Attends religious service: Not on file    Active member of club or organization: Not on file    Attends meetings of clubs or organizations: Not on file    Relationship status: Not on file  Other Topics Concern  . 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    2 sons, 1 daughter,    6 grandkids    Outpatient Encounter Medications as of 08/07/2017  Medication Sig  . atorvastatin (LIPITOR) 80 MG tablet Take 1 tablet (80 mg total) by mouth daily.  . calcium carbonate (OS-CAL) 600 MG TABS Take 600 mg by mouth daily.    . clopidogrel (PLAVIX) 75 MG tablet Take 1 tablet (75 mg total) by mouth daily.  . Fish Oil OIL Take 1 capsule by mouth daily.   Marland Kitchen gabapentin (NEURONTIN) 300 MG capsule Take by mouth.  . metoprolol tartrate (LOPRESSOR) 50 MG tablet Take 1 tablet (50 mg total) by mouth 2 (two) times daily.  . nitroGLYCERIN (NITROSTAT) 0.4 MG SL tablet Place 1 tablet (0.4 mg total) under the tongue every 5 (five) minutes as needed. For chest pain  . ramipril (ALTACE) 2.5 MG capsule TAKE ONE (1) CAPSULE BY MOUTH 2 TIMES DAILY  . [DISCONTINUED] predniSONE (DELTASONE) 20 MG tablet Take 2 a day for 5 days, then 1 a day for 5 days, with food. Don't take with aleve/ibuprofen.   No facility-administered encounter medications on file as of 08/07/2017.     Activities of Daily Living In your present state of health, do you have any difficulty performing the following activities: 08/07/2017  Hearing? N  Vision? N  Difficulty concentrating or making decisions? N  Walking or climbing stairs? N  Dressing or bathing? N  Doing errands, shopping? N  Preparing Food and eating ? N  Using the Toilet? N  In the past six months, have you accidently leaked urine? N  Do you have problems with loss of bowel control? N  Managing your Medications? N  Managing your Finances? N  Housekeeping or managing your Housekeeping? N  Some recent data might be hidden    Patient Care Team: Tonia Ghent, MD as PCP - General (Family Medicine) Burnell Blanks, MD as PCP - Cardiology (Cardiology) Burnell Blanks, MD as Consulting  Physician (Cardiology) Sharyne Peach, MD as Consulting Physician (Ophthalmology) Vicie Mutters, MD as Consulting Physician (Otolaryngology)   Assessment:   This is a routine wellness examination for [name].   Hearing Screening   125Hz  250Hz  500Hz  1000Hz  2000Hz  3000Hz  4000Hz  6000Hz  8000Hz   Right ear:   40 40 40  40    Left ear:  40 40 40  0      Visual Acuity Screening   Right eye Left eye Both eyes  Without correction:     With correction: 20/25 20/25-1 20/25-1  Comments: Vision exam approx. 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 Patient Stated     Starting 08/07/2017, I will continue to take medications as prescribed.        Fall Risk Fall Risk  08/07/2017 08/05/2016 04/03/2015 04/03/2015  Falls in the past year? No No No No   Depression Screen PHQ 2/9 Scores 08/07/2017 08/05/2016 04/03/2015 04/03/2015  PHQ - 2 Score 0 0 0 0  PHQ- 9 Score 0 - - -    Cognitive Function MMSE - Mini Mental State Exam 08/07/2017 08/05/2016  Orientation to time 5 5  Orientation to Place 5 5  Registration 3 3  Attention/ Calculation 0 0  Recall 3 3  Language- name 2 objects 0 0  Language- repeat 1 1  Language- follow 3 step command 3 3  Language- read & follow direction 0 0  Write a sentence 0 0  Copy design 0 0  Total score 20 20     PLEASE NOTE: A Mini-Cog screen was completed. Maximum score is 20. A value of 0 denotes this part of Folstein MMSE was not completed or the patient failed this part of the Mini-Cog screening.   Mini-Cog Screening Orientation to Time - Max 5 pts Orientation to Place - Max 5 pts Registration - Max 3 pts Recall - Max 3 pts Language Repeat - Max 1 pts Language Follow 3 Step Command - Max 3 pts     Immunization History  Administered  Date(s) Administered  . Influenza Split 10/18/2010  . Influenza Whole 11/13/2005, 10/28/2007, 11/02/2008, 11/21/2009  . Influenza,inj,Quad PF,6+ Mos 10/06/2013, 11/10/2016  . Pneumococcal Conjugate-13 04/03/2015  . Pneumococcal Polysaccharide-23 12/21/2002  . Td 04/28/1998, 05/02/2008  . Tdap 10/06/2014  . Zoster 01/05/2007    Screening Tests Health Maintenance  Topic Date Due  . INFLUENZA VACCINE  08/27/2017  . TETANUS/TDAP  10/05/2024  . PNA vac Low Risk Adult  Completed      Plan:     I have personally reviewed, addressed, and noted the following in the patient's chart:  A. Medical and social history B. Use of alcohol, tobacco or illicit drugs  C. Current medications and supplements D. Functional ability and status E.  Nutritional status F.  Physical activity G. Advance directives H. List of other physicians I.  Hospitalizations, surgeries, and ER visits in previous 12 months J.  [place] to include hearing, vision, cognitive, depression L. Referrals and appointments - none  In addition, I have reviewed and discussed with patient certain preventive protocols, quality metrics, and best practice recommendations. A written personalized care plan for preventive services as well as general preventive health recommendations were provided to patient.  See attached scanned questionnaire for additional information.   Signed,   Lindell Noe, MHA, BS, LPN Health Coach

## 2017-08-14 ENCOUNTER — Encounter: Payer: Medicare Other | Admitting: Family Medicine

## 2017-08-20 ENCOUNTER — Ambulatory Visit (HOSPITAL_COMMUNITY)
Admission: RE | Admit: 2017-08-20 | Discharge: 2017-08-20 | Disposition: A | Discharge status: Home / Self Care | Payer: Medicare Other | Source: Ambulatory Visit | Attending: Cardiology | Admitting: Cardiology

## 2017-08-20 DIAGNOSIS — I6523 Occlusion and stenosis of bilateral carotid arteries: Secondary | ICD-10-CM | POA: Insufficient documentation

## 2017-08-24 ENCOUNTER — Ambulatory Visit (INDEPENDENT_AMBULATORY_CARE_PROVIDER_SITE_OTHER): Payer: Medicare Other | Admitting: Family Medicine

## 2017-08-24 ENCOUNTER — Encounter: Payer: Self-pay | Admitting: Family Medicine

## 2017-08-24 VITALS — BP 142/74 | HR 81 | Temp 99.1°F | Ht 69.5 in | Wt 215.8 lb

## 2017-08-24 DIAGNOSIS — E119 Type 2 diabetes mellitus without complications: Secondary | ICD-10-CM

## 2017-08-24 DIAGNOSIS — M5431 Sciatica, right side: Secondary | ICD-10-CM | POA: Diagnosis not present

## 2017-08-24 DIAGNOSIS — I779 Disorder of arteries and arterioles, unspecified: Secondary | ICD-10-CM

## 2017-08-24 DIAGNOSIS — Z7189 Other specified counseling: Secondary | ICD-10-CM

## 2017-08-24 DIAGNOSIS — I10 Essential (primary) hypertension: Secondary | ICD-10-CM

## 2017-08-24 DIAGNOSIS — B079 Viral wart, unspecified: Secondary | ICD-10-CM

## 2017-08-24 DIAGNOSIS — E782 Mixed hyperlipidemia: Secondary | ICD-10-CM | POA: Diagnosis not present

## 2017-08-24 DIAGNOSIS — I739 Peripheral vascular disease, unspecified: Secondary | ICD-10-CM

## 2017-08-24 DIAGNOSIS — Z0000 Encounter for general adult medical examination without abnormal findings: Secondary | ICD-10-CM

## 2017-08-24 MED ORDER — OXYCODONE HCL 5 MG PO TABS: 5.0000 mg | ORAL_TABLET | Freq: Three times a day (TID) | ORAL | 0 refills | Status: DC | PRN

## 2017-08-24 NOTE — Progress Notes (Signed)
He has seen Dr. Carloyn Manner in the meantime.  He has been imaged in the meantime. Gabapentin didn't help, taking 300mg  TID.  He is going to f/u with Dr. Carloyn Manner.  He has some vertigo with gabapentin.  Prev prednisone didn't help. D/w pt about options.  He has tolerated oxycodone before without adverse effect and with some relief.  He declined hearing aids.   Shingles shot d/w pt.  Out of stock.  Colonoscopy deferred given age, d/w pt.  PSA still low, d/w pt. Living will d/w pt.  Wife designated if patient were incapacitated.   Precontemplative about quitting chew tobacco.  D/w pt.    Elevated Cholesterol: Using medications without problems: yes Muscle aches: no Diet compliance: encouraged.  Exercise: limited recently by back pain.   Hypertension:    Using medication without problems or lightheadedness: yes Chest pain with exertion:no Edema:no Short of breath:no Labs d/w pt.    Wart on L wrist, d/w pt about options.  He wanted it frozen, routine conversation had with patient.  He agrees to proceed.   Diabetes:  No meds.   Hypoglycemic episodes: no sx Hyperglycemic episodes: no sx  Feet problems: no Blood Sugars averaging: not checked.   Labs d/w pt.   H/o carotid disease.  Recent u/s done.  No fugax sx.  No new CVA sx.    Meds, vitals, and allergies reviewed.   PMH and SH reviewed  ROS: Per HPI unless specifically indicated in ROS section   GEN: nad, alert and oriented HEENT: mucous membranes moist NECK: supple w/o LA CV: rrr. PULM: ctab, no inc wob ABD: soft, +bs EXT: no edema SKIN: no acute rash but wart noted on left wrist.  Frozen x3 with liquid nitrogen without complication. R SLR positive.

## 2017-08-24 NOTE — Patient Instructions (Signed)
Cut back on sugars and sweets.  Recheck A1c at visit in about 3 months.   Use the eat right diet.  Use oxycodone in the meantime.  Sedation caution.  Follow up with Dr. Carloyn Manner.  Taper off the gabapentin in the meantime.  Keep the wart covered and it should blister up and heal over.  Take care.  Glad to see you.

## 2017-08-25 DIAGNOSIS — B079 Viral wart, unspecified: Secondary | ICD-10-CM | POA: Insufficient documentation

## 2017-08-25 DIAGNOSIS — I779 Disorder of arteries and arterioles, unspecified: Secondary | ICD-10-CM | POA: Insufficient documentation

## 2017-08-25 DIAGNOSIS — I739 Peripheral vascular disease, unspecified: Secondary | ICD-10-CM

## 2017-08-25 NOTE — Assessment & Plan Note (Signed)
He declined hearing aids.   Shingles shot d/w pt.  Out of stock.  Colonoscopy deferred given age, d/w pt.  PSA still low, d/w pt. Living will d/w pt.  Wife designated if patient were incapacitated.   Precontemplative about quitting chew tobacco.  D/w pt.

## 2017-08-25 NOTE — Assessment & Plan Note (Signed)
He will follow-up with Dr. Carloyn Manner.  Taper gabapentin in the meantime since he was having vertigo/lightheadedness with that.  Prednisone did not help previously.  Would not retry prednisone given diabetes.  Prescription given for short-term course of oxycodone with routine opiate cautions.  Update me as needed.  He agrees.

## 2017-08-25 NOTE — Assessment & Plan Note (Signed)
Continue statin.  Needs work on diet.  See above.  Labs discussed with patient.  He agrees.

## 2017-08-25 NOTE — Assessment & Plan Note (Signed)
Living will d/w pt.  Wife designated if patient were incapacitated.   ?

## 2017-08-25 NOTE — Assessment & Plan Note (Signed)
Recent u/s done.  Continue statin.  Continue current blood pressure medications.  Discussed with patient.  He agrees.

## 2017-08-25 NOTE — Assessment & Plan Note (Signed)
Meets criteria for diabetes.  Discussed with patient about options.  Does not need medication at this point.  He has been eating a lot of high carbohydrate foods with cobbler, potatoes, corn, etc.  I gave him a copy of the low carbohydrate diet.  If you make some low carbohydrate substitutions he can likely correct this without further intervention.  Recheck A1c in about 3 months at a visit.  All labs discussed with patient and he understood pathophysiology of diabetes.

## 2017-08-25 NOTE — Assessment & Plan Note (Addendum)
Continue bp meds as is.  Needs work on diet.  See above.  Labs discussed with patient.  He agrees. >25 minutes spent in face to face time with patient, >50% spent in counselling or coordination of care.

## 2017-08-25 NOTE — Assessment & Plan Note (Signed)
Frozen x3 with liquid nitrogen.  Routine cautions given.  We can guarantee 100% cure rate with treatment.  He may need retreatment.  He understood.  Local care in the meantime.

## 2017-09-01 ENCOUNTER — Telehealth: Payer: Self-pay | Admitting: Cardiovascular Disease

## 2017-09-01 NOTE — Telephone Encounter (Signed)
New message      [facility] Pre-operative Risk Assessment    Request for surgical clearance:  What type of surgery is being performed? [id number] TLIF   1. When is this surgery scheduled? TBD  2. What type of clearance is required (medical clearance vs. Pharmacy clearance to hold med vs. Both)? Both   3. Are there any medications that need to be held prior to surgery and how long?Plavix    4. Practice name and name of physician performing surgery? [facility]. What is your office phone number? [phone]   7.   What is your office fax number [phone]  8.   Anesthesia type (None, local, MAC, general) ? Not sure    [name] 09/01/2017, 11:01 AM  _________________________________________________________________   (provider comments below)

## 2017-09-04 NOTE — Telephone Encounter (Signed)
Plavix can be held 7 days prior to his planned surgery.   Lauree Chandler 09/04/2017 10:00 AM

## 2017-09-04 NOTE — Telephone Encounter (Signed)
   Primary Cardiologist: Lauree Chandler, MD  Chart reviewed as part of pre-operative protocol coverage. Patient was contacted 09/04/2017 in reference to pre-operative risk assessment for pending surgery as outlined below.  [name]. was last seen on 05/11/2017 by her Calcium.  Since that day, [name]. has done well with no chest discomfort or difficulty breathing.  Therefore, based on ACC/AHA guidelines, the patient would be at acceptable risk for the planned procedure without further cardiovascular testing.   Plavix can be held 7 days prior to his planned surgery.   I will route this recommendation to the requesting party via Epic fax function and remove from pre-op pool.  Please call with questions.  Daune Perch, NP 09/04/2017, 10:15 AM

## 2017-09-04 NOTE — Telephone Encounter (Signed)
Dr. Angelena Form, this pt is planned for back surgery. Can Plavix be held?  Please route response back to CV DIV PREOP  Thanks, Gae Bon

## 2017-11-27 DIAGNOSIS — M5126 Other intervertebral disc displacement, lumbar region: Secondary | ICD-10-CM | POA: Insufficient documentation

## 2017-11-27 DIAGNOSIS — M4316 Spondylolisthesis, lumbar region: Secondary | ICD-10-CM | POA: Insufficient documentation

## 2018-02-01 ENCOUNTER — Telehealth: Payer: Self-pay | Admitting: Family Medicine

## 2018-02-01 NOTE — Telephone Encounter (Signed)
PPW placed in Dr. Duncan's In Box. 

## 2018-02-01 NOTE — Telephone Encounter (Signed)
Pt dropped off a letter stating his neurologist is retiring. He's requesting information on a new neurologist because he doesn't want to see Dr. Vertell Limber again. Placed in [place] tower.

## 2018-02-07 ENCOUNTER — Telehealth: Payer: Self-pay | Admitting: Family Medicine

## 2018-02-07 DIAGNOSIS — M5137 Other intervertebral disc degeneration, lumbosacral region: Secondary | ICD-10-CM

## 2018-02-07 NOTE — Telephone Encounter (Signed)
Please call patient.  I got a note from him about Dr. Carloyn Manner retiring.  He needs to establish with a new neurosurgery clinic.  He had seen Dr. Vertell Limber previously but would like to see a different doctor.  Dr. Christella Noa and Dr. Ellene Route are both highly qualified doctors who do a great job.  My understanding is they are in the same clinic with Dr. Vertell Limber but I still think we can make a referral to see one of them.  If the patient is okay with this then let me know and I will put in the referral.  Thanks.

## 2018-02-08 NOTE — Telephone Encounter (Signed)
Patient says he is ok with seeing either of those MD's.

## 2018-02-09 NOTE — Addendum Note (Signed)
Addended by: Tonia Ghent on: 02/09/2018 06:15 AM   Modules accepted: Orders

## 2018-02-09 NOTE — Telephone Encounter (Signed)
Order placed. Thanks.

## 2018-03-16 ENCOUNTER — Ambulatory Visit: Payer: Medicare Other | Admitting: Family Medicine

## 2018-03-16 ENCOUNTER — Encounter: Payer: Self-pay | Admitting: Family Medicine

## 2018-03-16 VITALS — BP 144/66 | HR 66 | Temp 98.1°F | Ht 69.5 in | Wt 210.1 lb

## 2018-03-16 DIAGNOSIS — B0230 Zoster ocular disease, unspecified: Secondary | ICD-10-CM | POA: Diagnosis not present

## 2018-03-16 DIAGNOSIS — B029 Zoster without complications: Secondary | ICD-10-CM | POA: Insufficient documentation

## 2018-03-16 LAB — HM DIABETES EYE EXAM

## 2018-03-16 MED ORDER — VALACYCLOVIR HCL 1 G PO TABS: 1000.0000 mg | ORAL_TABLET | Freq: Three times a day (TID) | ORAL | 0 refills | Status: DC

## 2018-03-16 NOTE — Patient Instructions (Signed)
Get the valtrex filled and start it immediately - take the whole 7 day course  Keep skin clean and dry  We will get you in with eye doctor to evaluate further   The medicine you are already on should help discomfort -but if symptoms worsen let me know   I will let Dr Damita Dunnings know what is going on

## 2018-03-16 NOTE — Progress Notes (Signed)
Subjective:    Patient ID: [name]., male    DOB: 04-15-1932, 85 y.o.   MRN: [id number]  HPI Here with c/o of eye swelling on L   85 yo pt of Dr Damita Dunnings   Symptoms started early last week  Had sharp pain in L side of head over temple (fleeting and recurrent)  Then some swelling over the skin lateral to left eye  It feels tightness over L forehead Then a red spot developed 1-2 days ago   Vision is ok - but has to squint a bit with small print  Has never had cataracts  Last eye exam was 2 months ago No pain to move eye or close it  No redness or drainage    No fever or illness No sinus symptoms   Had back surgery in sept- still getting over that  Takes gabapentin   zostavax 12/08 Has never had shingles   No falls - being careful   bp is up a bit today   Patient Active Problem List   Diagnosis Date Noted  . Shingles 03/16/2018  . Wart 08/25/2017  . Carotid artery disorder ([place]) 08/25/2017  . Healthcare maintenance 08/13/2016  . Diabetes mellitus without complication ([place]) 82/95/6213  . Advance care planning 08/13/2016  . Medicare annual wellness visit, subsequent 04/03/2015  . Right sided sciatica 06/14/2014  . BCC (basal cell carcinoma), back 12/18/2010  . CAD (coronary artery disease) 09/13/2010  . DEGENERATIVE DISC DISEASE, LUMBAR SPINE 03/18/2007  . NEOP, MALIGNANT, PROSTATE 11/26/2006  . [place], MIXED 11/26/2006  . HYPERTENSION, BENIGN ESSENTIAL 11/26/2006  . ERECTILE DYSFUNCTION, ORGANIC 11/26/2006   Past Medical History:  Diagnosis Date  . CAD (coronary artery disease)    PTCA 1998 LAD-s/p overlapping DES in LAD in 05/2003  . Chest pain 5/5-5/11/2003   Hospital MI, CAD, INCR, CHOL, HTN  . Elevated glucose   . History of colonoscopy   . HTN (hypertension)   . Hyperlipidemia   . Impotence    of organic nature  . Otitis media   . Prostate cancer [facility])    s/p prostatectomy  . Spinal disease    lumbosacral   Past Surgical History:   Procedure Laterality Date  . ANGIOPLASTY  1992  . bone spur right foot     Bilateral  . CORONARY ANGIOPLASTY  06/02/2003   PTCA, occl of vessel w/good collaterals  . LAMINECTOMY    . left shoulder decompression    . pilonidal cystectomy  late 20s  . PROSTATECTOMY    . right carpal tunnel repair  11/18/2007   Dr. Daylene Katayama   Social History   Tobacco Use  . Smoking status: Never Smoker  . Smokeless tobacco: Current User    Types: Chew  Substance Use Topics  . Alcohol use: Yes    Alcohol/week: 2.0 - 3.0 standard drinks    Types: 2 - 3 Glasses of wine per week    Comment: occassionally  . Drug use: No   Family History  Problem Relation Age of Onset  . Prostate cancer Father   . Cancer Father        prostate  . Colon cancer Mother   . Leukemia Mother   . Cancer Mother        colon, leukemia   Allergies  Allergen Reactions  . Doxycycline Rash  . Tetracycline Rash   Current Outpatient Medications on File Prior to Visit  Medication Sig Dispense Refill  . atorvastatin (LIPITOR) 80 MG  tablet Take 1 tablet (80 mg total) by mouth daily. 90 tablet 3  . calcium carbonate (OS-CAL) 600 MG TABS Take 600 mg by mouth daily.      . clopidogrel (PLAVIX) 75 MG tablet Take 1 tablet (75 mg total) by mouth daily. 90 tablet 3  . Fish Oil OIL Take 1 capsule by mouth daily.     Marland Kitchen GABAPENTIN PO Take 1 tablet by mouth 3 (three) times daily as needed.    . metoprolol tartrate (LOPRESSOR) 50 MG tablet Take 1 tablet (50 mg total) by mouth 2 (two) times daily. 180 tablet 3  . nitroGLYCERIN (NITROSTAT) 0.4 MG SL tablet Place 1 tablet (0.4 mg total) under the tongue every 5 (five) minutes as needed. For chest pain 25 tablet 6  . oxyCODONE (ROXICODONE) 5 MG immediate release tablet Take 1 tablet (5 mg total) by mouth 3 (three) times daily as needed for severe pain (sedation caution.). 15 tablet 0  . ramipril (ALTACE) 2.5 MG capsule TAKE ONE (1) CAPSULE BY MOUTH 2 TIMES DAILY 180 capsule 3   No current  facility-administered medications on file prior to visit.      Review of Systems  Constitutional: Negative for activity change, appetite change, fatigue, fever and unexpected weight change.  HENT: Negative for congestion, rhinorrhea, sore throat and trouble swallowing.   Eyes: Positive for itching and visual disturbance. Negative for pain and redness.  Respiratory: Negative for cough, chest tightness, shortness of breath and wheezing.   Cardiovascular: Negative for chest pain and palpitations.  Gastrointestinal: Negative for abdominal pain, blood in stool, constipation, diarrhea and nausea.  Endocrine: Negative for cold intolerance, heat intolerance, polydipsia and polyuria.  Genitourinary: Negative for difficulty urinating, dysuria, frequency and urgency.  Musculoskeletal: Positive for back pain. Negative for arthralgias, joint swelling and myalgias.  Skin: Positive for rash. Negative for pallor.       Discomfort and swollen feeling of skin over L upper face    Neurological: Positive for headaches. Negative for dizziness, tremors, weakness and numbness.       Occ sharp fleeting pains over L side of head (especially temple) on and off for a week  Hematological: Negative for adenopathy. Does not bruise/bleed easily.  Psychiatric/Behavioral: Negative for decreased concentration and dysphoric mood. The patient is not nervous/anxious.        Objective:   Physical Exam Constitutional:      General: He is not in acute distress.    Appearance: Normal appearance. He is obese. He is not ill-appearing.  HENT:     Head: Normocephalic and atraumatic.     Right Ear: Tympanic membrane and ear canal normal.     Left Ear: Tympanic membrane and ear canal normal.     Ears:     Comments: Mild cerumen    Nose: Nose normal. No congestion or rhinorrhea.     Mouth/Throat:     Mouth: Mucous membranes are moist.     Pharynx: Oropharynx is clear. No oropharyngeal exudate or posterior oropharyngeal  erythema.  Eyes:     General: No scleral icterus.       Right eye: No discharge.        Left eye: No discharge.     Extraocular Movements: Extraocular movements intact.     Conjunctiva/sclera: Conjunctivae normal.     Pupils: Pupils are equal, round, and reactive to light.     Comments: No pain with eye movement  Neck:     Musculoskeletal: Normal range of motion.  Cardiovascular:     Rate and Rhythm: Normal rate and regular rhythm.     Heart sounds: Normal heart sounds.  Pulmonary:     Effort: Pulmonary effort is normal. No respiratory distress.     Breath sounds: Normal breath sounds.  Lymphadenopathy:     Cervical: No cervical adenopathy.  Skin:    General: Skin is warm and dry.     Findings: Rash present.     Comments: Pt has 1-2 cm area of small papules with confluent redness just to midline of forehead on L  Also smaller area of redness at L med brow  Diffuse changes of seb derm  Few open pores    Neurological:     Mental Status: He is alert.     Cranial Nerves: No cranial nerve deficit.     Sensory: No sensory deficit.     Coordination: Coordination normal.  Psychiatric:        Mood and Affect: Mood normal.           Assessment & Plan:   Problem List Items Addressed This Visit      Other   Shingles - Primary    Pt has had L sided forehead/temple/eye discomfort (he says swelling- mild if any on exam today) since last week  Red spots developed on forehead just L of midline 1-2 days ago (also smaller area at medial edge of brow)  Suspect zoster (he is already on oxycodone and gabapentin which may lessen discomfort)  In light of proximity to eye (he is unsure of vision change) - placed urgent ref to opthy  Px valtrex 1 g TID for 7 d  Care of rash reviewed  After opthy f/u will decide pcp f/u inst to alert [place] if symptoms worsen      Relevant Medications   valACYclovir (VALTREX) 1000 MG tablet   Other Relevant Orders   Ambulatory referral to Ophthalmology

## 2018-03-16 NOTE — Assessment & Plan Note (Signed)
Pt has had L sided forehead/temple/eye discomfort (he says swelling- mild if any on exam today) since last week  Red spots developed on forehead just L of midline 1-2 days ago (also smaller area at medial edge of brow)  Suspect zoster (he is already on oxycodone and gabapentin which may lessen discomfort)  In light of proximity to eye (he is unsure of vision change) - placed urgent ref to opthy  Px valtrex 1 g TID for 7 d  Care of rash reviewed  After opthy f/u will decide pcp f/u inst to alert [place] if symptoms worsen

## 2018-03-18 ENCOUNTER — Ambulatory Visit: Payer: Medicare Other | Admitting: Family Medicine

## 2018-05-01 ENCOUNTER — Other Ambulatory Visit: Payer: Self-pay | Admitting: Cardiovascular Disease

## 2018-05-01 DIAGNOSIS — E7849 Other hyperlipidemia: Secondary | ICD-10-CM

## 2018-05-01 DIAGNOSIS — I10 Essential (primary) hypertension: Secondary | ICD-10-CM

## 2018-05-01 DIAGNOSIS — I2510 Atherosclerotic heart disease of native coronary artery without angina pectoris: Secondary | ICD-10-CM

## 2018-05-31 ENCOUNTER — Other Ambulatory Visit: Payer: Self-pay | Admitting: Cardiovascular Disease

## 2018-05-31 DIAGNOSIS — I6523 Occlusion and stenosis of bilateral carotid arteries: Secondary | ICD-10-CM

## 2018-06-08 ENCOUNTER — Telehealth: Payer: Self-pay | Admitting: Cardiovascular Disease

## 2018-06-08 NOTE — Telephone Encounter (Signed)
PHONE visit on 07/12/2018      Phone Call to obtain consent    -    06/08/2018         Virtual Visit Pre-Appointment Phone Call  "(Name), I am calling you today to discuss your upcoming appointment. We are currently trying to limit exposure to the virus that causes COVID-19 by seeing patients at home rather than in the office."  1. "What is the BEST phone number to call the day of the visit?" - include this in appointment notes  2. Do you have or have access to (through a family member/friend) a smartphone with video capability that we can use for your visit?" a. If yes - list this number in appt notes as cell (if different from BEST phone #) and list the appointment type as a VIDEO visit in appointment notes b. If no - list the appointment type as a PHONE visit in appointment notes  3. Confirm consent - "In the setting of the current Covid19 crisis, you are scheduled for a (phone or video) visit with your provider on (date) at (time).  Just as we do with many in-office visits, in order for you to participate in this visit, we must obtain consent.  If you'd like, I can send this to your mychart (if signed up) or email for you to review.  Otherwise, I can obtain your verbal consent now.  All virtual visits are billed to your insurance company just like a normal visit would be.  By agreeing to a virtual visit, we'd like you to understand that the technology does not allow for your provider to perform an examination, and thus may limit your provider's ability to fully assess your condition. If your provider identifies any concerns that need to be evaluated in person, we will make arrangements to do so.  Finally, though the technology is pretty good, we cannot assure that it will always work on either your or our end, and in the setting of a video visit, we may have to convert it to a phone-only visit.  In either situation, we cannot ensure that we have a secure connection.  Are you  willing to proceed?" STAFF: Did the patient verbally acknowledge consent to telehealth visit? Document YES/NO here: YES  4. Advise patient to be prepared - "Two hours prior to your appointment, go ahead and check your blood pressure, pulse, oxygen saturation, and your weight (if you have the equipment to check those) and write them all down. When your visit starts, your provider will ask you for this information. If you have an Apple Watch or Kardia device, please plan to have heart rate information ready on the day of your appointment. Please have a pen and paper handy nearby the day of the visit as well."  5. Give patient instructions for MyChart download to smartphone OR Doximity/Doxy.me as below if video visit (depending on what platform provider is using)  6. Inform patient they will receive a phone call 15 minutes prior to their appointment time (may be from unknown caller ID) so they should be prepared to answer    [place]. has been deemed a candidate for a follow-up tele-health visit to limit community exposure during the Covid-19 pandemic. I spoke with the patient via phone to ensure availability of phone/video source, confirm preferred email & phone number, and discuss instructions and expectations.  I reminded [name]. to be prepared with any  vital sign and/or heart rhythm information that could potentially be obtained via home monitoring, at the time of his visit. I reminded [name]. to expect a phone call prior to his visit.  Thayer Headings 06/08/2018 11:45 AM   INSTRUCTIONS FOR DOWNLOADING THE MYCHART APP TO SMARTPHONE  - The patient must first make sure to have activated MyChart and know their login information - If Apple, go to [employer] and type in MyChart in the search bar and download the app. If Android, ask patient to go to [employer] and type in [place] in the search bar and download the app. The app is free but as with any  other app downloads, their phone may require them to verify saved payment information or Apple/Android password.  - The patient will need to then log into the app with their MyChart username and password, and select [facility] as their healthcare provider to link the account. When it is time for your visit, go to the MyChart app, find appointments, and click Begin Video Visit. Be sure to Select Allow for your device to access the Microphone and Camera for your visit. You will then be connected, and your provider will be with you shortly.  **If they have any issues connecting, or need assistance please contact MyChart service desk [phone])**  **If using a computer, in order to ensure the best quality for their visit they will need to use either of the following Internet Browsers: [employer], or Google Chrome**  IF USING DOXIMITY or DOXY.ME - The patient will receive a link just prior to their visit by text.     FULL LENGTH CONSENT FOR TELE-HEALTH VISIT   I hereby voluntarily request, consent and authorize [facility] and its employed or contracted physicians, physician assistants, nurse practitioners or other licensed health care professionals (the Practitioner), to provide me with telemedicine health care services (the Services") as deemed necessary by the treating Practitioner. I acknowledge and consent to receive the Services by the Practitioner via telemedicine. I understand that the telemedicine visit will involve communicating with the Practitioner through live audiovisual communication technology and the disclosure of certain medical information by electronic transmission. I acknowledge that I have been given the opportunity to request an in-person assessment or other available alternative prior to the telemedicine visit and am voluntarily participating in the telemedicine visit.  I understand that I have the right to withhold or withdraw my consent to the use of  telemedicine in the course of my care at any time, without affecting my right to future care or treatment, and that the Practitioner or I may terminate the telemedicine visit at any time. I understand that I have the right to inspect all information obtained and/or recorded in the course of the telemedicine visit and may receive copies of available information for a reasonable fee.  I understand that some of the potential risks of receiving the Services via telemedicine include:   Delay or interruption in medical evaluation due to technological equipment failure or disruption;  Information transmitted may not be sufficient (e.g. poor resolution of images) to allow for appropriate medical decision making by the Practitioner; and/or   In rare instances, security protocols could fail, causing a breach of personal health information.  Furthermore, I acknowledge that it is my responsibility to provide information about my medical history, conditions and care that is complete and accurate to the best of my ability. I acknowledge that Practitioner's advice, recommendations, and/or decision  may be based on factors not within their control, such as incomplete or inaccurate data provided by me or distortions of diagnostic images or specimens that may result from electronic transmissions. I understand that the practice of medicine is not an exact science and that Practitioner makes no warranties or guarantees regarding treatment outcomes. I acknowledge that I will receive a copy of this consent concurrently upon execution via email to the email address I last provided but may also request a printed copy by calling the office of [facility].    I understand that my insurance will be billed for this visit.   I have read or had this consent read to me.  I understand the contents of this consent, which adequately explains the benefits and risks of the Services being provided via telemedicine.   I have been  provided ample opportunity to ask questions regarding this consent and the Services and have had my questions answered to my satisfaction.  I give my informed consent for the services to be provided through the use of telemedicine in my medical care  By participating in this telemedicine visit I agree to the above.

## 2018-06-16 ENCOUNTER — Telehealth: Payer: Self-pay | Admitting: Family Medicine

## 2018-06-16 NOTE — Telephone Encounter (Signed)
I would offer OV at [facility] if possible.  That is likely better than phone visit.  Thanks.

## 2018-06-16 NOTE — Telephone Encounter (Signed)
Best number [phone] I schedule pt for phone visit no virtual capability for lower back pain.  Is virtual ok or do you need to see him off. Pt wants referral

## 2018-06-17 ENCOUNTER — Ambulatory Visit: Payer: Medicare Other | Admitting: Family Medicine

## 2018-06-17 ENCOUNTER — Encounter: Payer: Self-pay | Admitting: Family Medicine

## 2018-06-17 ENCOUNTER — Other Ambulatory Visit: Payer: Self-pay

## 2018-06-17 ENCOUNTER — Encounter: Payer: Self-pay | Admitting: *Deleted

## 2018-06-17 VITALS — BP 143/72 | HR 61 | Temp 98.1°F | Wt 213.0 lb

## 2018-06-17 DIAGNOSIS — M545 Low back pain, unspecified: Secondary | ICD-10-CM

## 2018-06-17 DIAGNOSIS — Z7189 Other specified counseling: Secondary | ICD-10-CM | POA: Diagnosis not present

## 2018-06-17 DIAGNOSIS — G8929 Other chronic pain: Secondary | ICD-10-CM | POA: Diagnosis not present

## 2018-06-17 MED ORDER — OXYCODONE-ACETAMINOPHEN 10-325 MG PO TABS: 1.0000 | ORAL_TABLET | Freq: Four times a day (QID) | ORAL | 0 refills | Status: DC | PRN

## 2018-06-17 MED ORDER — GABAPENTIN 300 MG PO CAPS: 300.0000 mg | ORAL_CAPSULE | Freq: Three times a day (TID) | ORAL | Status: DC | PRN

## 2018-06-17 NOTE — Progress Notes (Signed)
Pandemic considerations d/w pt.  He is staying at home as much as possible.    Back pain.  He had seen Dr. Ocie Cornfield.  Used prn, along with gabapentin.  He is taking gabapentin every day.  He doesn't take oxycodone daily.  He has no pain sitting but sig pain after getting up and moving around.    Indication for chronic opioid:  Lower back pain Medication and dose: oxycodone/APAP 10/325 # pills per month: 60, but that usually lasts >1 month Last UDS date: pending.  Pain contract signed (Y/N): yes Date narcotic database last reviewed (include red flags): 06/17/18   S/p right L3-L4 L4-L5, L5-S1 MASTLIF with pedicle screw fixation from L3-S1 done on 10/19/17.  He doesn't have new neuro sx that would need neurosurgery f/u.  D/w pt.  His pain is clearly better sitting.  He doesn't have radicular pain down the R leg.   He uses med intermittent and he may have neg UDS today.  D/w pt.    Pain is in R> L buttock, worse as the day goes on.    Pain inventory (1-10) Average pain: variable as the day goes on.   Pain now: none sitting.  My pain is sharp, not constant Pain is worse with activity Relief from meds: yes, with relief from prn oxycodone.   In the last 24 hours, how much has pain interfered with the following (1-10 greatest interference)? General activity- some limitation at the end of the day Relationships with others - none Enjoyment of life- some limitation from pain.  What time of the day is the pain the worst- end of the day Sleep is described by patient as "good"  Mobility/function Assistance device:no How many minutes can you walk: longer earlier in the day, less later in the day.  ~100 yards later in the day.   Able to climb steps:yes Driving:yes Disabled:no  Bowel or bladder symptoms:no Mood: "good."    Physicians involved in care: Dr. Ocie Cornfield Any changes since last visit?Dr. Carloyn Manner retired.    Fall cautions d/w pt.  Opiate cautions d/w pt.    Meds, vitals, and allergies  reviewed.   ROS: Per HPI unless specifically indicated in ROS section   GEN: nad, alert and oriented NECK: supple w/o LA CV: rrr PULM: ctab, no inc wob ABD: soft, +bs EXT: no edema SKIN: well perfused.

## 2018-06-17 NOTE — Telephone Encounter (Signed)
Pt aware of in office appointment

## 2018-06-17 NOTE — Patient Instructions (Signed)
Don't change your meds for now.  Update me as needed.  Take care.  Glad to see you.  Go to the lab on the way out.  We'll contact you with your lab report. 

## 2018-06-19 LAB — PAIN MGMT, PROFILE 8 W/CONF, U
6 Acetylmorphine: NEGATIVE ng/mL
Alcohol Metabolites: POSITIVE ng/mL — AB (ref <500)
Amphetamines: NEGATIVE ng/mL
Benzodiazepines: NEGATIVE ng/mL
Buprenorphine, Urine: NEGATIVE ng/mL
Cocaine Metabolite: NEGATIVE ng/mL
Creatinine: 52.8 mg/dL
Ethyl Glucuronide (ETG): 11790 ng/mL
Ethyl Sulfate (ETS): 4720 ng/mL
MDMA: NEGATIVE ng/mL
Marijuana Metabolite: NEGATIVE ng/mL
Opiates: NEGATIVE ng/mL
Oxidant: NEGATIVE ug/mL
Oxycodone: NEGATIVE ng/mL
pH: 7.6 (ref 4.5–9.0)

## 2018-06-21 DIAGNOSIS — Z7189 Other specified counseling: Secondary | ICD-10-CM | POA: Insufficient documentation

## 2018-06-21 NOTE — Assessment & Plan Note (Addendum)
  Indication for chronic opioid:  Lower back pain Medication and dose: oxycodone/APAP 10/325 # pills per month: 60, but that usually lasts >1 month Last UDS date: pending.  Pain contract signed (Y/N): yes Date narcotic database last reviewed (include red flags): 06/17/18   S/p right L3-L4 L4-L5, L5-S1 MASTLIF with pedicle screw fixation from L3-S1 done on 10/19/17.

## 2018-06-21 NOTE — Assessment & Plan Note (Signed)
He had seen Dr. Ocie Cornfield.  Used prn, along with gabapentin.  He is taking gabapentin every day.  He doesn't take oxycodone daily.  He has no pain sitting but sig pain after getting up and moving around.    Indication for chronic opioid:  Lower back pain Medication and dose: oxycodone/APAP 10/325 # pills per month: 60, but that usually lasts >1 month Last UDS date: pending.  Pain contract signed (Y/N): yes Date narcotic database last reviewed (include red flags): 06/17/18   S/p right L3-L4 L4-L5, L5-S1 MASTLIF with pedicle screw fixation from L3-S1 done on 10/19/17.  He doesn't have new neuro sx that would need neurosurgery f/u.  D/w pt.  His pain is clearly better sitting.  He doesn't have radicular pain down the R leg.   He uses med intermittent and he may have neg UDS today.  D/w pt.   see notes on labs.  No change in meds at this point.  I can prescribe his medications now that Dr. Carloyn Manner has retired.  We can refer back to neurosurgery if he has a change in his symptoms.  >25 minutes spent in face to face time with patient, >50% spent in counselling or coordination of care

## 2018-07-12 ENCOUNTER — Other Ambulatory Visit: Payer: Self-pay

## 2018-07-12 ENCOUNTER — Telehealth (INDEPENDENT_AMBULATORY_CARE_PROVIDER_SITE_OTHER): Payer: Medicare Other | Admitting: Cardiovascular Disease

## 2018-07-12 ENCOUNTER — Encounter: Payer: Self-pay | Admitting: Cardiovascular Disease

## 2018-07-12 VITALS — Ht 69.5 in | Wt 215.0 lb

## 2018-07-12 DIAGNOSIS — I2510 Atherosclerotic heart disease of native coronary artery without angina pectoris: Secondary | ICD-10-CM

## 2018-07-12 DIAGNOSIS — I10 Essential (primary) hypertension: Secondary | ICD-10-CM

## 2018-07-12 DIAGNOSIS — I493 Ventricular premature depolarization: Secondary | ICD-10-CM

## 2018-07-12 DIAGNOSIS — I6523 Occlusion and stenosis of bilateral carotid arteries: Secondary | ICD-10-CM

## 2018-07-12 DIAGNOSIS — E7849 Other hyperlipidemia: Secondary | ICD-10-CM

## 2018-07-12 NOTE — Progress Notes (Signed)
Virtual Visit via Video Note   This visit type was conducted due to national recommendations for restrictions regarding the COVID-19 Pandemic (e.g. social distancing) in an effort to limit this patient's exposure and mitigate transmission in our community.  Due to his co-morbid illnesses, this patient is at least at moderate risk for complications without adequate follow up.  This format is felt to be most appropriate for this patient at this time.  All issues noted in this document were discussed and addressed.  A limited physical exam was performed with this format.  Please refer to the patient's chart for his consent to telehealth for [facility].   Date:  07/12/2018   ID:  [name]., DOB [birth year], MRN [id number]  Patient Location: Home Provider Location: Office  PCP:  Tonia Ghent, MD  Cardiologist:  Lauree Chandler, MD  Electrophysiologist:  None   Evaluation Performed:  Follow-Up Visit  Chief Complaint:  Follow up- CAD  History of Present Illness:    [name]. is a 85 y.o. male with history of CAD, HTN, HLD and PVCs who is being seen today by virtual e-visit due to the Covid19 pandemic. In 1998 he had PTCA of the LAD. In 2005 he had a drug-eluting stent placed in the LAD and then a short time later had a second drug-eluting stent placed in the LAD overlapping the first for an edge tear. Echo August 2012 with LVEF=55-60%. He has moderate carotid artery disease. 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 He had Lyme disease in 2018. Echo March 2018 with normal LV systolic and diastolic function, mild MR.   The patient denies chest pain, dyspnea, palpitations, dizziness, near syncope or syncope. No lower extremity edema.   The patient does not have symptoms concerning for COVID-19 infection (fever, chills, cough, or new shortness of breath).   Past Medical History:  Diagnosis Date  . CAD  (coronary artery disease)    PTCA 1998 LAD-s/p overlapping DES in LAD in 05/2003  . Chest pain 5/5-5/11/2003   Hospital MI, CAD, INCR, CHOL, HTN  . Elevated glucose   . History of colonoscopy   . HTN (hypertension)   . Hyperlipidemia   . Impotence    of organic nature  . Otitis media   . Prostate cancer [facility])    s/p prostatectomy  . Spinal disease    lumbosacral   Past Surgical History:  Procedure Laterality Date  . ANGIOPLASTY  1992  . bone spur right foot     Bilateral  . CORONARY ANGIOPLASTY  06/02/2003   PTCA, occl of vessel w/good collaterals  . LAMINECTOMY    . left shoulder decompression    . pilonidal cystectomy  late 20s  . PROSTATECTOMY    . right carpal tunnel repair  11/18/2007   Dr. Daylene Katayama     Current Meds  Medication Sig  . atorvastatin (LIPITOR) 80 MG tablet Take 1 tablet (80 mg total) by mouth daily. Please keep upcoming appt for future refills. Thank you  . calcium carbonate (OS-CAL) 600 MG TABS Take 600 mg by mouth daily.    . clopidogrel (PLAVIX) 75 MG tablet Take 1 tablet (75 mg total) by mouth daily. Please keep upcoming appt for future refills. Thank you  . Fish Oil OIL Take 1 capsule by mouth daily.   Marland Kitchen gabapentin (NEURONTIN) 300 MG capsule Take 1  capsule (300 mg total) by mouth 3 (three) times daily as needed.  . metoprolol tartrate (LOPRESSOR) 50 MG tablet Take 1 tablet (50 mg total) by mouth 2 (two) times daily. Please keep upcoming appt for future refills. Thank you  . nitroGLYCERIN (NITROSTAT) 0.4 MG SL tablet Place 1 tablet (0.4 mg total) under the tongue every 5 (five) minutes as needed. For chest pain  . oxyCODONE-acetaminophen (PERCOCET) 10-325 MG tablet Take 1 tablet by mouth every 6 (six) hours as needed (sedation caution).  . ramipril (ALTACE) 2.5 MG capsule TAKE ONE (1) CAPSULE BY MOUTH 2 TIMES DAILY. Please keep upcoming appt for future refills. Thank you     Allergies:   Doxycycline and Tetracycline   Social History   Tobacco Use   . Smoking status: Never Smoker  . Smokeless tobacco: Current User    Types: Chew  Substance Use Topics  . Alcohol use: Yes    Alcohol/week: 2.0 - 3.0 standard drinks    Types: 2 - 3 Glasses of wine per week    Comment: occassionally  . Drug use: No     Family Hx: The patient's family history includes Cancer in his father and mother; Colon cancer in his mother; Leukemia in his mother; Prostate cancer in his father.  ROS:   Please see the history of present illness.    All other systems reviewed and are negative.   Prior CV studies:   The following studies were reviewed today:  Echo March 2018: - Left ventricle: The cavity size was normal. Systolic function was normal. The estimated ejection fraction was in the range of 60% to 65%. Wall motion was normal; there were no regional wall motion abnormalities. Left ventricular diastolic function parameters were normal. - Mitral valve: There was mild regurgitation. - Atrial septum: No defect or patent foramen ovale was identified.   Labs/Other Tests and Data Reviewed:    EKG:  No ECG reviewed.  Recent Labs: 08/07/2017: ALT 23; BUN 18; Creatinine, Ser 0.89; Potassium 4.6; Sodium 141   Recent Lipid Panel Lab Results  Component Value Date/Time   CHOL 156 08/07/2017 08:55 AM   CHOL 120 04/24/2016 07:34 AM   TRIG 160.0 (H) 08/07/2017 08:55 AM   HDL 46.40 08/07/2017 08:55 AM   HDL 31 (L) 04/24/2016 07:34 AM   CHOLHDL 3 08/07/2017 08:55 AM   LDLCALC 78 08/07/2017 08:55 AM   LDLCALC 67 04/24/2016 07:34 AM    Wt Readings from Last 3 Encounters:  07/12/18 215 lb (97.5 kg)  06/17/18 213 lb (96.6 kg)  03/16/18 210 lb 2 oz (95.3 kg)     Objective:    Vital Signs:  Ht 5' 9.5" (1.765 m)   Wt 215 lb (97.5 kg)   BMI 31.29 kg/m    VITAL SIGNS:  reviewed GEN:  no acute distress  ASSESSMENT & PLAN:    1. CAD without angina: LV function normal by echo in 2018. He has no chest pain.  Continue Plavix, beta blocker and  statin.    2. HTN: BP is well controlled. No changes today  3. HLD: LDL is near goal. Will continue statin.    4. Carotid artery disease: Stable mild to moderate carotid disease by dopplers July 2019.  Repeat July 2021  5. PVCs: He has no palpitations. Will continue beta blocker.   COVID-19 Education: The signs and symptoms of COVID-19 were discussed with the patient and how to seek care for testing (follow up with PCP or arrange E-visit).  The importance of social distancing was discussed today.  Time:   Today, I have spent 16 minutes with the patient with telehealth technology discussing the above problems.     Medication Adjustments/Labs and Tests Ordered: Current medicines are reviewed at length with the patient today.  Concerns regarding medicines are outlined above.   Tests Ordered: No orders of the defined types were placed in this encounter.   Medication Changes: No orders of the defined types were placed in this encounter.   Disposition:  Follow up in 6 month(s)  Signed, Lauree Chandler, MD  07/12/2018 2:43 PM    [place]

## 2018-07-12 NOTE — Patient Instructions (Signed)

## 2018-07-27 ENCOUNTER — Other Ambulatory Visit: Payer: Self-pay | Admitting: Cardiovascular Disease

## 2018-07-27 DIAGNOSIS — E7849 Other hyperlipidemia: Secondary | ICD-10-CM

## 2018-07-27 DIAGNOSIS — I10 Essential (primary) hypertension: Secondary | ICD-10-CM

## 2018-07-27 DIAGNOSIS — I2510 Atherosclerotic heart disease of native coronary artery without angina pectoris: Secondary | ICD-10-CM

## 2018-08-16 ENCOUNTER — Telehealth: Payer: Self-pay | Admitting: Family Medicine

## 2018-08-16 DIAGNOSIS — E119 Type 2 diabetes mellitus without complications: Secondary | ICD-10-CM

## 2018-08-16 DIAGNOSIS — Z125 Encounter for screening for malignant neoplasm of prostate: Secondary | ICD-10-CM

## 2018-08-16 NOTE — Telephone Encounter (Signed)
Pt need labs orders for AMV labs 08/19/18.

## 2018-08-18 NOTE — Telephone Encounter (Signed)
Done. Thanks.

## 2018-08-19 ENCOUNTER — Ambulatory Visit: Payer: Medicare Other

## 2018-08-19 ENCOUNTER — Other Ambulatory Visit: Payer: Self-pay

## 2018-08-19 ENCOUNTER — Other Ambulatory Visit (INDEPENDENT_AMBULATORY_CARE_PROVIDER_SITE_OTHER): Payer: Medicare Other

## 2018-08-19 DIAGNOSIS — Z125 Encounter for screening for malignant neoplasm of prostate: Secondary | ICD-10-CM | POA: Diagnosis not present

## 2018-08-19 DIAGNOSIS — E119 Type 2 diabetes mellitus without complications: Secondary | ICD-10-CM | POA: Diagnosis not present

## 2018-08-19 LAB — COMPREHENSIVE METABOLIC PANEL
ALT: 28 U/L (ref 0–53)
AST: 20 U/L (ref 0–37)
Albumin: 4.6 g/dL (ref 3.5–5.2)
Alkaline Phosphatase: 99 U/L (ref 39–117)
BUN: 10 mg/dL (ref 6–23)
CO2: 29 mEq/L (ref 19–32)
Calcium: 9.7 mg/dL (ref 8.4–10.5)
Chloride: 105 mEq/L (ref 96–112)
Creatinine, Ser: 0.84 mg/dL (ref 0.40–1.50)
GFR: 87.11 mL/min (ref >60.00)
Glucose, Bld: 124 mg/dL — ABNORMAL HIGH (ref 70–99)
Potassium: 5.4 mEq/L — ABNORMAL HIGH (ref 3.5–5.1)
Sodium: 141 mEq/L (ref 135–145)
Total Bilirubin: 0.8 mg/dL (ref 0.2–1.2)
Total Protein: 6.6 g/dL (ref 6.0–8.3)

## 2018-08-19 LAB — PSA, MEDICARE: PSA: 0.01 ng/ml — ABNORMAL LOW (ref 0.10–4.00)

## 2018-08-19 LAB — LIPID PANEL
Cholesterol: 109 mg/dL (ref 0–200)
HDL: 37.6 mg/dL — ABNORMAL LOW (ref >39.00)
LDL Cholesterol: 53 mg/dL (ref 0–99)
NonHDL: 71.36
Total CHOL/HDL Ratio: 3
Triglycerides: 94 mg/dL (ref 0.0–149.0)
VLDL: 18.8 mg/dL (ref 0.0–40.0)

## 2018-08-19 LAB — HEMOGLOBIN A1C: Hgb A1c MFr Bld: 6.4 % (ref 4.6–6.5)

## 2018-08-20 ENCOUNTER — Ambulatory Visit: Payer: Medicare Other

## 2018-08-23 NOTE — Progress Notes (Signed)
Patient decided that he did not want to do the AWV. He stated that it was stupid and he wants to opt out of them moving on.

## 2018-08-24 ENCOUNTER — Encounter: Payer: Medicare Other | Admitting: Family Medicine

## 2018-08-26 ENCOUNTER — Ambulatory Visit (INDEPENDENT_AMBULATORY_CARE_PROVIDER_SITE_OTHER): Payer: Medicare Other | Admitting: Family Medicine

## 2018-08-26 ENCOUNTER — Encounter: Payer: Self-pay | Admitting: Family Medicine

## 2018-08-26 ENCOUNTER — Other Ambulatory Visit: Payer: Self-pay

## 2018-08-26 VITALS — BP 126/62 | HR 65 | Temp 99.1°F | Resp 16 | Ht 69.75 in | Wt 203.3 lb

## 2018-08-26 DIAGNOSIS — E782 Mixed hyperlipidemia: Secondary | ICD-10-CM | POA: Diagnosis not present

## 2018-08-26 DIAGNOSIS — M5137 Other intervertebral disc degeneration, lumbosacral region: Secondary | ICD-10-CM | POA: Diagnosis not present

## 2018-08-26 DIAGNOSIS — Z8639 Personal history of other endocrine, nutritional and metabolic disease: Secondary | ICD-10-CM | POA: Diagnosis not present

## 2018-08-26 DIAGNOSIS — Z0000 Encounter for general adult medical examination without abnormal findings: Secondary | ICD-10-CM

## 2018-08-26 DIAGNOSIS — M549 Dorsalgia, unspecified: Secondary | ICD-10-CM | POA: Diagnosis not present

## 2018-08-26 DIAGNOSIS — I10 Essential (primary) hypertension: Secondary | ICD-10-CM

## 2018-08-26 DIAGNOSIS — E875 Hyperkalemia: Secondary | ICD-10-CM

## 2018-08-26 DIAGNOSIS — Z7189 Other specified counseling: Secondary | ICD-10-CM

## 2018-08-26 LAB — POTASSIUM: Potassium: 5.1 mEq/L (ref 3.5–5.1)

## 2018-08-26 MED ORDER — OXYCODONE-ACETAMINOPHEN 10-325 MG PO TABS: 1.0000 | ORAL_TABLET | Freq: Four times a day (QID) | ORAL | 0 refills | Status: DC | PRN

## 2018-08-26 NOTE — Progress Notes (Signed)
I have personally reviewed the Medicare Annual Wellness questionnaire and have noted 1. The patient's medical and social history 2. Their use of alcohol, tobacco or illicit drugs 3. Their current medications and supplements 4. The patient's functional ability including ADL's, fall risks, home safety risks and hearing or visual             impairment. 5. Diet and physical activities 6. Evidence for depression or mood disorders  The patients weight, height, BMI have been recorded in the chart and visual acuity is per eye clinic.  I have made referrals, counseling and provided education to the patient based review of the above and I have provided the pt with a written personalized care plan for preventive services.  Provider list updated- see scanned forms.  Routine anticipatory guidance given to patient.  See health maintenance. The possibility exists that previously documented standard health maintenance information may have been brought forward from a previous encounter into this note.  If needed, that same information has been updated to reflect the current situation based on today's encounter.    Flu yearly Shingles discussed with patient about options, it may be cheaper pharmacy. PNA up-to-date Tetanus 2016 Colon cancer screening not applicable due to age Prostate cancer screening 2020 Advance directive-wife designated if patient were incapacitated. Cognitive function addressed- see scanned forms- and if abnormal then additional documentation follows.    Elevated Cholesterol: Using medications without problems:yes Muscle aches: not from statin  Diet compliance:yes Exercise: as tolerated.   Hypertension:    Using medication without problems or lightheadedness: yes  Chest pain with exertion:no Edema:no Short of breath:no  H/o DM2.  A1c <6.5, d/w pt.  Diet and exercise d/w pt.  Labs d/w pt.    Back pain. Worse later in the day.  His leg is better but he has lower back pain later  in the day, after standing.  He prev had pain in his R leg with driving/sitting, but that is resolved.  Using pain meds at baseline w/o ADE.  D/w pt about referral to Dr. Arnoldo Morale.  He has no pain at time of exam but has pain on standing, quick onset.  His pain meds help some.  Defer repeat imaging at this point, until we get neurosurgery input.  No BLE weakness.  No B/B sx.    K elevation noted.  D/w pt about recheck K level today.   PMH and SH reviewed  Meds, vitals, and allergies reviewed.   ROS: Per HPI.  Unless specifically indicated otherwise in HPI, the patient denies:  General: fever. Eyes: acute vision changes ENT: sore throat Cardiovascular: chest pain Respiratory: SOB GI: vomiting GU: dysuria Musculoskeletal: acute back pain Derm: acute rash Neuro: acute motor dysfunction Psych: worsening mood Endocrine: polydipsia Heme: bleeding Allergy: hayfever  GEN: nad, alert and oriented HEENT: ncat NECK: supple w/o LA CV: rrr. PULM: ctab, no inc wob ABD: soft, +bs EXT: no edema SKIN: no acute rash  Diabetic foot exam: Normal inspection No skin breakdown No calluses  Normal DP pulses Normal sensation to light touch and monofilament Nails normal  Health Maintenance  Topic Date Due  . INFLUENZA VACCINE  08/28/2018  . HEMOGLOBIN A1C  02/19/2019  . OPHTHALMOLOGY EXAM  03/17/2019  . FOOT EXAM  08/26/2019  . TETANUS/TDAP  10/05/2024  . PNA vac Low Risk Adult  Completed

## 2018-08-26 NOTE — Patient Instructions (Addendum)
We'll call about an appointment with Dr. Arnoldo Morale.  Go to the lab on the way out.  We'll contact you with your lab report. Take care.  Glad to see you.  I would get a flu shot each fall.   Update me as needed.

## 2018-09-02 DIAGNOSIS — E875 Hyperkalemia: Secondary | ICD-10-CM | POA: Insufficient documentation

## 2018-09-02 NOTE — Assessment & Plan Note (Signed)
H/o DM2.  A1c <6.5, d/w pt.  Diet and exercise d/w pt.  Labs d/w pt.

## 2018-09-02 NOTE — Assessment & Plan Note (Signed)
Continue work on diet and exercise as much as he can.  No change in meds.  He agrees.  Labs discussed with patient.

## 2018-09-02 NOTE — Assessment & Plan Note (Signed)
K elevation noted.  D/w pt about recheck K level today.

## 2018-09-02 NOTE — Assessment & Plan Note (Signed)
Flu yearly Shingles discussed with patient about options, it may be cheaper pharmacy. PNA up-to-date Tetanus 2016 Colon cancer screening not applicable due to age Prostate cancer screening 2020 Advance directive-wife designated if patient were incapacitated. Cognitive function addressed- see scanned forms- and if abnormal then additional documentation follows.

## 2018-09-02 NOTE — Assessment & Plan Note (Signed)
Back pain. Worse later in the day.  His leg is better but he has lower back pain later in the day, after standing.  He prev had pain in his R leg with driving/sitting, but that is resolved.  Using pain meds at baseline w/o ADE.  D/w pt about referral to Dr. Arnoldo Morale.  He has no pain at time of exam but has pain on standing, quick onset.  His pain meds help some.  Defer repeat imaging at this point, until we get neurosurgery input.  No BLE weakness.  No B/B sx.   no change in meds at this point.  Not sedated.  Okay for outpatient follow-up.

## 2018-09-02 NOTE — Assessment & Plan Note (Signed)
Advance directive- wife designated if patient were incapacitated.  

## 2018-11-09 ENCOUNTER — Telehealth: Payer: Self-pay | Admitting: *Deleted

## 2018-11-09 NOTE — Telephone Encounter (Signed)
   [facility] Pre-operative Risk Assessment    Request for surgical clearance:  1. What type of surgery is being performed? MBB BILATERAL-L5/S2   2. When is this surgery scheduled? 11/26/18   3. What type of clearance is required (medical clearance vs. Pharmacy clearance to hold med vs. Both)? MEDICAL  4. Are there any medications that need to be held prior to surgery and how long? PLAVIX  X 7 DAYS PRIOR  5. Practice name and name of physician performing surgery?  [place]; DR. PAUL HARKINS   6. What is your office phone number [phone]    7.   What is your office fax number [phone]  8.   Anesthesia type (None, local, MAC, general) ? NOT LISTED; GENERAL?   [name] 11/09/2018, 9:35 AM  _________________________________________________________________   (provider comments below)

## 2018-11-09 NOTE — Telephone Encounter (Signed)
Dr. Angelena Form  Can you please comment on this patients Plavix therapy? He is scheduled for a MBB bilateral L5/S2 procedure on 11/26/2018. Surgical team is asking to hold Plavix for 7 days prior to procedure.   You last saw him on 07/12/2018 in follow up for CAD. He has a hx of CAD s/p PTCA in 1998 and DES x2 to LAD in 2005, HTN, HLD, moderate carotid disease and PVCs. Echocardiogram from 2012 with an LVEF of 55-60%. He was doing well at that time.   Please send your recommendations to the pro-op pool    Thank you Sharee Pimple

## 2018-11-10 NOTE — Telephone Encounter (Signed)
OK to hold Plavix prior to his procedure. Thanks, chris

## 2018-12-01 ENCOUNTER — Other Ambulatory Visit: Payer: Self-pay | Admitting: Cardiovascular Disease

## 2018-12-01 DIAGNOSIS — I2510 Atherosclerotic heart disease of native coronary artery without angina pectoris: Secondary | ICD-10-CM

## 2018-12-01 DIAGNOSIS — E7849 Other hyperlipidemia: Secondary | ICD-10-CM

## 2018-12-01 DIAGNOSIS — I10 Essential (primary) hypertension: Secondary | ICD-10-CM

## 2019-02-08 NOTE — Progress Notes (Signed)
Chief Complaint  Patient presents with  . Follow-up    CAD   History of Present Illness: 86 yo male with history of CAD, HTN, HLD, PVCs here today for cardiac follow up. In 1998 he had PTCA of the LAD. In 2005 he had a drug-eluting stent placed in the LAD and then a short time later had a second drug-eluting stent placed in the LAD overlapping the first for an edge tear. He has moderate carotid artery disease by carotid artery dopplers July 2019. 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 He had Lyme disease in 2018. Echo March 2018 with normal LV systolic and diastolic function, mild MR.   He is here today for follow up. The patient denies any chest pain, dyspnea, palpitations, lower extremity edema, orthopnea, PND, dizziness, near syncope or syncope.   Primary Care Physician: Tonia Ghent, MD  Past Medical History:  Diagnosis Date  . CAD (coronary artery disease)    PTCA 1998 LAD-s/p overlapping DES in LAD in 05/2003  . Chest pain 5/5-5/11/2003   Hospital MI, CAD, INCR, CHOL, HTN  . Elevated glucose   . History of colonoscopy   . HTN (hypertension)   . Hyperlipidemia   . Impotence    of organic nature  . Otitis media   . Prostate cancer [facility])    s/p prostatectomy  . Spinal disease    lumbosacral    Past Surgical History:  Procedure Laterality Date  . ANGIOPLASTY  1992  . bone spur right foot     Bilateral  . CORONARY ANGIOPLASTY  06/02/2003   PTCA, occl of vessel w/good collaterals  . LAMINECTOMY    . left shoulder decompression    . pilonidal cystectomy  late 20s  . PROSTATECTOMY    . right carpal tunnel repair  11/18/2007   Dr. Daylene Katayama    Current Outpatient Medications  Medication Sig Dispense Refill  . atorvastatin (LIPITOR) 80 MG tablet Take 1 tablet (80 mg total) by mouth daily. 90 tablet 1  . calcium carbonate (OS-CAL) 600 MG TABS Take 600 mg by mouth daily.      . clopidogrel (PLAVIX) 75  MG tablet Take 1 tablet (75 mg total) by mouth daily. 90 tablet 1  . Fish Oil OIL Take 1 capsule by mouth daily.     Marland Kitchen gabapentin (NEURONTIN) 300 MG capsule Take 1 capsule (300 mg total) by mouth 3 (three) times daily as needed.    . metoprolol tartrate (LOPRESSOR) 50 MG tablet Take 1 tablet (50 mg total) by mouth 2 (two) times daily. 180 tablet 1  . nitroGLYCERIN (NITROSTAT) 0.4 MG SL tablet Place 1 tablet (0.4 mg total) under the tongue every 5 (five) minutes as needed. For chest pain 25 tablet 6  . oxyCODONE-acetaminophen (PERCOCET) 10-325 MG tablet Take 1 tablet by mouth every 6 (six) hours as needed (sedation caution). 60 tablet 0  . ramipril (ALTACE) 2.5 MG capsule Take 1 capsule (2.5 mg total) by mouth 2 (two) times daily. 180 capsule 1   No current facility-administered medications for this visit.    Allergies  Allergen Reactions  . Doxycycline Rash  . Tetracycline Rash    Social History   Socioeconomic History  . Marital status: Married    Spouse name: Not on file  . Number of children: 3  . Years of education: Not on file  . Highest education level: Not  on file  Occupational History  . Occupation: retired-field coord for [employer]  Tobacco Use  . Smoking status: Never Smoker  . Smokeless tobacco: Current User    Types: Chew  Substance and Sexual Activity  . Alcohol use: Yes    Alcohol/week: 2.0 - 3.0 standard drinks    Types: 2 - 3 Glasses of wine per week    Comment: occassionally  . Drug use: No  . Sexual activity: Never  Other Topics Concern  . 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    2 sons, 1 daughter,    6 grandkids   Social Determinants of Radio broadcast assistant Strain:   . Difficulty of Paying Living Expenses: Not on file  Food Insecurity:   . Worried About Charity fundraiser in the Last Year: Not on file  . Ran Out of  Food in the Last Year: Not on file  Transportation Needs:   . Lack of Transportation (Medical): Not on file  . Lack of Transportation (Non-Medical): Not on file  Physical Activity:   . Days of Exercise per Week: Not on file  . Minutes of Exercise per Session: Not on file  Stress:   . Feeling of Stress : Not on file  Social Connections:   . Frequency of Communication with Friends and Family: Not on file  . Frequency of Social Gatherings with Friends and Family: Not on file  . Attends Religious Services: Not on file  . Active Member of Clubs or Organizations: Not on file  . Attends Archivist Meetings: Not on file  . Marital Status: Not on file  Intimate Partner Violence:   . Fear of Current or Ex-Partner: Not on file  . Emotionally Abused: Not on file  . Physically Abused: Not on file  . Sexually Abused: Not on file    Family History  Problem Relation Age of Onset  . Prostate cancer Father   . Cancer Father        prostate  . Colon cancer Mother   . Leukemia Mother   . Cancer Mother        colon, leukemia    Review of Systems:  As stated in the HPI and otherwise negative.   BP (!) 132/54   Pulse 68   Ht 5\' 9"  (1.753 m)   Wt 213 lb 12.8 oz (97 kg)   SpO2 96%   BMI 31.57 kg/m   Physical Examination:  General: Well developed, well nourished, NAD  HEENT: OP clear, mucus membranes moist  SKIN: warm, dry. No rashes. Neuro: No focal deficits  Musculoskeletal: Muscle strength 5/5 all ext  Psychiatric: Mood and affect normal  Neck: No JVD, no carotid bruits, no thyromegaly, no lymphadenopathy.  Lungs:Clear bilaterally, no wheezes, rhonci, crackles Cardiovascular: Regular rate and rhythm. No murmurs, gallops or rubs. Abdomen:Soft. Bowel sounds present. Non-tender.  Extremities: No lower extremity edema. Pulses are 2 + in the bilateral DP/PT.  Echo March 2018: - Left ventricle: The cavity size was normal. Systolic function was   normal. The estimated ejection  fraction was in the range of 60%   to 65%. Wall motion was normal; there were no regional wall   motion abnormalities. Left ventricular diastolic function   parameters were normal. - Mitral valve: There was mild regurgitation. - Atrial septum: No defect or  patent foramen ovale was identified.  EKG:  EKG is not ordered today The ekg ordered today demonstrates   Recent Labs: 08/19/2018: ALT 28; BUN 10; Creatinine, Ser 0.84; Sodium 141 08/26/2018: Potassium 5.1   Lipid Panel    Component Value Date/Time   CHOL 109 08/19/2018 0802   CHOL 120 04/24/2016 0734   TRIG 94.0 08/19/2018 0802   HDL 37.60 (L) 08/19/2018 0802   HDL 31 (L) 04/24/2016 0734   CHOLHDL 3 08/19/2018 0802   VLDL 18.8 08/19/2018 0802   LDLCALC 53 08/19/2018 0802   LDLCALC 67 04/24/2016 0734     Wt Readings from Last 3 Encounters:  02/09/19 213 lb 12.8 oz (97 kg)  08/26/18 203 lb 5 oz (92.2 kg)  07/12/18 215 lb (97.5 kg)     Other studies Reviewed: Additional studies/ records that were reviewed today include: . Review of the above records demonstrates:   Assessment and Plan:   1. CAD without angina: He has no chest pain suggestive of angina. LV function normal by echo in 2018. Continue Plavix, statin and beta blocker.    2. HTN: BP is well controlled. No changes  3. HLD: LDL at goal July 2020. Continue statin  4. Carotid artery disease: Stable mild to moderate carotid disease by dopplers July 2019. Repeat in July 2021.   5. PVCs: No palpitations. Continue beta blocker  Current medicines are reviewed at length with the patient today.  The patient does not have concerns regarding medicines.  The following changes have been made:  no change  Labs/ tests ordered today include:   No orders of the defined types were placed in this encounter.    Disposition:   FU with me in 12  months   Signed, Lauree Chandler, MD 02/09/2019 11:11 AM    [facility] [place],  [place], [place]  [address] Phone: [phone]; Fax: [phone]

## 2019-02-09 ENCOUNTER — Ambulatory Visit: Payer: Medicare PPO | Admitting: Cardiovascular Disease

## 2019-02-09 ENCOUNTER — Other Ambulatory Visit: Payer: Self-pay

## 2019-02-09 ENCOUNTER — Encounter: Payer: Self-pay | Admitting: Cardiovascular Disease

## 2019-02-09 VITALS — BP 132/54 | HR 68 | Ht 69.0 in | Wt 213.8 lb

## 2019-02-09 DIAGNOSIS — I2510 Atherosclerotic heart disease of native coronary artery without angina pectoris: Secondary | ICD-10-CM

## 2019-02-09 DIAGNOSIS — I493 Ventricular premature depolarization: Secondary | ICD-10-CM | POA: Diagnosis not present

## 2019-02-09 DIAGNOSIS — I6523 Occlusion and stenosis of bilateral carotid arteries: Secondary | ICD-10-CM | POA: Diagnosis not present

## 2019-02-09 DIAGNOSIS — E7849 Other hyperlipidemia: Secondary | ICD-10-CM | POA: Diagnosis not present

## 2019-02-09 DIAGNOSIS — I10 Essential (primary) hypertension: Secondary | ICD-10-CM | POA: Diagnosis not present

## 2019-02-09 NOTE — Patient Instructions (Signed)

## 2019-02-17 ENCOUNTER — Ambulatory Visit: Payer: Medicare PPO | Attending: Internal Medicine

## 2019-02-17 DIAGNOSIS — Z23 Encounter for immunization: Secondary | ICD-10-CM | POA: Insufficient documentation

## 2019-02-17 NOTE — Progress Notes (Signed)
   [id number]  Name:  [name].    MRN: [id number] DOB: 05/05/1932  02/17/2019  [name] was observed post Covid-19 immunization for 15 minutes without incidence. He was provided with Vaccine Information Sheet and instruction to access the V-Safe system.   [name] was instructed to call 911 with any severe reactions post vaccine: Marland Kitchen Difficulty breathing  . Swelling of your face and throat  . A fast heartbeat  . A bad rash all over your body  . Dizziness and weakness    Immunizations Administered    Name Date Dose VIS Date Route   Pfizer COVID-19 Vaccine 02/17/2019  8:27 AM 0.3 mL 01/07/2019 Intramuscular   Manufacturer: [place]   Lot: [id number]   [place]: [id number]

## 2019-03-10 ENCOUNTER — Ambulatory Visit: Payer: Medicare PPO | Attending: Internal Medicine

## 2019-03-10 DIAGNOSIS — Z23 Encounter for immunization: Secondary | ICD-10-CM | POA: Insufficient documentation

## 2019-03-10 NOTE — Progress Notes (Signed)
   [id number] Vaccination [facility]  Name:  Canan Landgraf.    MRN: [id number] DOB: 1932/05/23  03/10/2019  Mr. Helmich was observed post Covid-19 immunization for 15 minutes without incidence. He was provided with Vaccine Information Sheet and instruction to access the V-Safe system.   Mr. Loureiro was instructed to call 911 with any severe reactions post vaccine: Marland Kitchen Difficulty breathing  . Swelling of your face and throat  . A fast heartbeat  . A bad rash all over your body  . Dizziness and weakness    Immunizations Administered    Name Date Dose VIS Date Route   Pfizer COVID-19 Vaccine 03/10/2019  8:12 AM 0.3 mL 01/07/2019 Intramuscular   Manufacturer: [place]   Lot: [id number]   [place]: [id number]

## 2019-03-23 DIAGNOSIS — M5416 Radiculopathy, lumbar region: Secondary | ICD-10-CM | POA: Diagnosis not present

## 2019-03-23 DIAGNOSIS — S32009K Unspecified fracture of unspecified lumbar vertebra, subsequent encounter for fracture with nonunion: Secondary | ICD-10-CM | POA: Diagnosis not present

## 2019-03-31 DIAGNOSIS — H2513 Age-related nuclear cataract, bilateral: Secondary | ICD-10-CM | POA: Diagnosis not present

## 2019-03-31 DIAGNOSIS — B0239 Other herpes zoster eye disease: Secondary | ICD-10-CM | POA: Diagnosis not present

## 2019-03-31 DIAGNOSIS — H5213 Myopia, bilateral: Secondary | ICD-10-CM | POA: Diagnosis not present

## 2019-03-31 DIAGNOSIS — H52203 Unspecified astigmatism, bilateral: Secondary | ICD-10-CM | POA: Diagnosis not present

## 2019-03-31 DIAGNOSIS — H43392 Other vitreous opacities, left eye: Secondary | ICD-10-CM | POA: Diagnosis not present

## 2019-04-03 ENCOUNTER — Other Ambulatory Visit: Payer: Self-pay | Admitting: Family Medicine

## 2019-04-03 DIAGNOSIS — M549 Dorsalgia, unspecified: Secondary | ICD-10-CM

## 2019-04-03 NOTE — Progress Notes (Signed)
Note from patient requesting referral to spine center at [facility].  Referral placed.

## 2019-04-19 DIAGNOSIS — M4326 Fusion of spine, lumbar region: Secondary | ICD-10-CM | POA: Diagnosis not present

## 2019-04-19 DIAGNOSIS — G8929 Other chronic pain: Secondary | ICD-10-CM | POA: Diagnosis not present

## 2019-04-19 DIAGNOSIS — M545 Low back pain: Secondary | ICD-10-CM | POA: Diagnosis not present

## 2019-04-19 DIAGNOSIS — Z981 Arthrodesis status: Secondary | ICD-10-CM | POA: Diagnosis not present

## 2019-04-19 DIAGNOSIS — M47816 Spondylosis without myelopathy or radiculopathy, lumbar region: Secondary | ICD-10-CM | POA: Diagnosis not present

## 2019-04-20 ENCOUNTER — Telehealth: Payer: Self-pay | Admitting: Family Medicine

## 2019-04-20 MED ORDER — OXYCODONE-ACETAMINOPHEN 10-325 MG PO TABS: 1.0000 | ORAL_TABLET | Freq: Four times a day (QID) | ORAL | 0 refills | Status: DC | PRN

## 2019-04-20 NOTE — Telephone Encounter (Signed)
[place] patient.  [place] CSRS reviewed plz notify pt oxycodone refilled

## 2019-04-20 NOTE — Telephone Encounter (Signed)
Received message from patient that he is almost out of his Oxycodone and will need a Refill on it at [place] [facility] they recommended water therapy. He is going to [facility] in [place] to swim.

## 2019-04-20 NOTE — Telephone Encounter (Signed)
Pt informed

## 2019-04-26 DIAGNOSIS — M545 Low back pain: Secondary | ICD-10-CM | POA: Diagnosis not present

## 2019-04-27 DIAGNOSIS — M545 Low back pain: Secondary | ICD-10-CM | POA: Diagnosis not present

## 2019-05-03 DIAGNOSIS — M545 Low back pain: Secondary | ICD-10-CM | POA: Diagnosis not present

## 2019-05-05 DIAGNOSIS — M545 Low back pain: Secondary | ICD-10-CM | POA: Diagnosis not present

## 2019-05-10 DIAGNOSIS — M545 Low back pain: Secondary | ICD-10-CM | POA: Diagnosis not present

## 2019-05-13 DIAGNOSIS — M545 Low back pain: Secondary | ICD-10-CM | POA: Diagnosis not present

## 2019-05-17 DIAGNOSIS — M545 Low back pain: Secondary | ICD-10-CM | POA: Diagnosis not present

## 2019-05-19 DIAGNOSIS — M545 Low back pain: Secondary | ICD-10-CM | POA: Diagnosis not present

## 2019-05-24 DIAGNOSIS — M545 Low back pain: Secondary | ICD-10-CM | POA: Diagnosis not present

## 2019-05-26 DIAGNOSIS — M545 Low back pain: Secondary | ICD-10-CM | POA: Diagnosis not present

## 2019-05-31 DIAGNOSIS — M545 Low back pain: Secondary | ICD-10-CM | POA: Diagnosis not present

## 2019-06-02 ENCOUNTER — Other Ambulatory Visit: Payer: Self-pay | Admitting: Cardiovascular Disease

## 2019-06-02 DIAGNOSIS — I10 Essential (primary) hypertension: Secondary | ICD-10-CM

## 2019-06-02 DIAGNOSIS — M545 Low back pain: Secondary | ICD-10-CM | POA: Diagnosis not present

## 2019-06-02 DIAGNOSIS — E7849 Other hyperlipidemia: Secondary | ICD-10-CM

## 2019-06-03 ENCOUNTER — Other Ambulatory Visit: Payer: Self-pay | Admitting: Cardiovascular Disease

## 2019-06-03 DIAGNOSIS — I2510 Atherosclerotic heart disease of native coronary artery without angina pectoris: Secondary | ICD-10-CM

## 2019-06-29 ENCOUNTER — Telehealth: Payer: Self-pay

## 2019-06-29 NOTE — Telephone Encounter (Signed)
I spoke with pt; UC & Ed precautions given and pt voiced understanding otherwise will see Dr Damita Dunnings on 06/30/19.

## 2019-06-29 NOTE — Telephone Encounter (Signed)
[place] Night - Client TELEPHONE ADVICE RECORD [facility] Patient Name: [name] Gender: Male DOB: 01-Nov-1932 Age: 86 Y 16 M 6 D Return Phone Number: [phone] (Primary) Address: City/State/Zip: [place] [address] Client [place] Night - Client Client Site [place] Physician Renford Dills - MD Contact Type Call Who Is Calling Patient / Member / Family / Caregiver Call Type Triage / Clinical Relationship To Patient Self Return Phone Number [phone] (Primary) Chief Complaint Rash - Localized Reason for Call Symptomatic / Request for [place] states he has a rash on his foot and thinks it is cellulitis. Translation No Nurse Assessment Nurse: Zenia Resides, RN, Diane Date/Time Eilene Ghazi Time): 06/29/2019 8:17:41 AM Confirm and document reason for call. If symptomatic, describe symptoms. ---Caller states he has a rash on his foot- thinks it may be cellulitis. Foot has some swelling. Rash is red. Not painful, but itches sometimes. No fever. Has the patient had close contact with a person known or suspected to have the novel coronavirus illness OR traveled / lives in area with major community spread (including international travel) in the last 14 days from the onset of symptoms? * If Asymptomatic, screen for exposure and travel within the last 14 days. ---No Does the patient have any new or worsening symptoms? ---Yes Will a triage be completed? ---Yes Related visit to physician within the last 2 weeks? ---No Does the PT have any chronic conditions? (i.e. diabetes, asthma, this includes High risk factors for pregnancy, etc.) ---Yes List chronic conditions. ---HTN Is this a behavioral health or substance abuse call? ---No Guidelines Guideline Title Affirmed Question Affirmed Notes Nurse Date/Time (Eastern Time) Rash or Redness - Localized [1] Looks  infected (spreading redness, pus) AND [2] no fever Zenia Resides, RN, Diane 06/29/2019 8:19:40 AM Disp. Time Eilene Ghazi Time) Disposition Final User 06/29/2019 8:21:18 AM See PCP within 24 Hours Yes Zenia Resides, RN, Diane PLEASE NOTE: All timestamps contained within this report are represented as [place] Standard Time. CONFIDENTIALTY NOTICE: This fax transmission is intended only for the addressee. It contains information that is legally privileged, confidential or otherwise protected from use or disclosure. If you are not the intended recipient, you are strictly prohibited from reviewing, disclosing, copying using or disseminating any of this information or taking any action in reliance on or regarding this information. If you have received this fax in error, please notify us immediately by telephone so that we can arrange for its return to [place]. Phone: [phone], Toll-Free: [phone], Fax: [phone] Page: 2 of 2 Call Id: [id number] [place] Disagree/Comply Comply Caller Understands Yes PreDisposition Home Care Care Advice Given Per Guideline SEE PCP WITHIN 24 HOURS: * IF OFFICE WILL BE OPEN: You need to be examined within the next 24 hours. Call your doctor (or NP/PA) when the office opens and make an appointment. CALL BACK IF: * Fever occurs CARE ADVICE given per Rash - Localized and Cause Unknown (Adult) guideline. CLEANING AN INFECTED WOUND: * Wash the infected area with soap and water 3 times a day. ANTIBIOTIC OINTMENT - INFECTED AREA: * Put a small amount of antibiotic ointment on the infected area 3 times per day. Referrals REFERRED TO PCP OFFICE

## 2019-06-29 NOTE — Telephone Encounter (Signed)
Please give him routine cautions in the meantime, if that has not already been done.  Thanks.

## 2019-06-29 NOTE — Telephone Encounter (Signed)
Pt already scheduled for appt  06/30/19 at 11:45 with Dr Damita Dunnings.

## 2019-06-30 ENCOUNTER — Other Ambulatory Visit: Payer: Self-pay

## 2019-06-30 ENCOUNTER — Ambulatory Visit: Payer: Medicare PPO | Admitting: Family Medicine

## 2019-06-30 ENCOUNTER — Encounter: Payer: Self-pay | Admitting: Family Medicine

## 2019-06-30 VITALS — BP 152/66 | HR 71 | Temp 97.2°F | Ht 69.0 in | Wt 208.4 lb

## 2019-06-30 DIAGNOSIS — G8929 Other chronic pain: Secondary | ICD-10-CM | POA: Diagnosis not present

## 2019-06-30 DIAGNOSIS — B369 Superficial mycosis, unspecified: Secondary | ICD-10-CM | POA: Diagnosis not present

## 2019-06-30 MED ORDER — KETOCONAZOLE 2 % EX CREA: 1.0000 "application " | TOPICAL_CREAM | Freq: Two times a day (BID) | CUTANEOUS | 0 refills | Status: DC

## 2019-06-30 MED ORDER — OXYCODONE-ACETAMINOPHEN 10-325 MG PO TABS: 1.0000 | ORAL_TABLET | Freq: Four times a day (QID) | ORAL | 0 refills | Status: DC | PRN

## 2019-06-30 NOTE — Patient Instructions (Addendum)
Use ketoconazole cream and this should resolve.  Update me as needed.   If your feet are wet from sweat, then change socks.   Take care.  Glad to see you.

## 2019-06-30 NOTE — Progress Notes (Signed)
This visit occurred during the SARS-CoV-2 public health emergency.  Safety protocols were in place, including screening questions prior to the visit, additional usage of staff PPE, and extensive cleaning of exam room while observing appropriate contact time as indicated for disinfecting solutions.  Foot rash.  R dorsal foot.  Started about 10 days ago.  Well demarcated.  Prev itchy but not now.  Not painful.  1 single prev blister that healed in the meantime.  Similar rash between 1st and 2nd toes.  No fevers.  No chills.  Neosporin didn't help.    He needed refill on oxycodone.  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  He went to therapy but it didn't help much with pain.  He is going to f/u with [facility].  He'll call about f/u.  On ADE on med.    Indication for chronic opioid:  Lower back pain Medication and dose: oxycodone/APAP 10/325 # pills per month: 60, but that usually lasts >1 month Last UDS date: pending.  Pain contract signed (Y/N): yes Date narcotic database last reviewed (include red flags): 06/30/19   He had his covid vaccine.    Meds, vitals, and allergies reviewed.   ROS: Per HPI unless specifically indicated in ROS section   nad ncat Right foot exam with what appears to be a well demarcated superficial fungal infection not a bacterial cellulitis.  He has some flaking of skin that looks similar but less pronounced between the toes.  Not tender.  No streaking erythema.  Palpable dorsalis pedis pulse.  Normal capillary refill on the foot.  Not tender to palpation over any bony prominences.

## 2019-07-03 DIAGNOSIS — B369 Superficial mycosis, unspecified: Secondary | ICD-10-CM | POA: Insufficient documentation

## 2019-07-03 NOTE — Assessment & Plan Note (Addendum)
  He needed refill on oxycodone.  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  He went to therapy but it didn't help much with pain.  He is going to f/u with [facility].  He'll call about f/u.  On ADE on med.  Continue oxycodone as is.  He will update me as needed.  Indication for chronic opioid:  Lower back pain Medication and dose: oxycodone/APAP 10/325 # pills per month: 60, but that usually lasts >1 month Last UDS date: pending.  Pain contract signed (Y/N): yes Date narcotic database last reviewed (include red flags): 06/30/19

## 2019-07-03 NOTE — Assessment & Plan Note (Signed)
Discussed options.Use ketoconazole cream and this should resolve.  Update me as needed.  Routine cautions given to patient.  He agrees.

## 2019-07-09 DIAGNOSIS — Z720 Tobacco use: Secondary | ICD-10-CM | POA: Diagnosis not present

## 2019-07-09 DIAGNOSIS — R05 Cough: Secondary | ICD-10-CM | POA: Diagnosis not present

## 2019-07-09 DIAGNOSIS — J209 Acute bronchitis, unspecified: Secondary | ICD-10-CM | POA: Diagnosis not present

## 2019-07-09 DIAGNOSIS — Z9181 History of falling: Secondary | ICD-10-CM | POA: Diagnosis not present

## 2019-11-30 DIAGNOSIS — M4326 Fusion of spine, lumbar region: Secondary | ICD-10-CM | POA: Insufficient documentation

## 2019-11-30 DIAGNOSIS — M7918 Myalgia, other site: Secondary | ICD-10-CM | POA: Diagnosis not present

## 2019-11-30 DIAGNOSIS — M542 Cervicalgia: Secondary | ICD-10-CM | POA: Insufficient documentation

## 2019-11-30 DIAGNOSIS — M461 Sacroiliitis, not elsewhere classified: Secondary | ICD-10-CM | POA: Insufficient documentation

## 2019-12-09 ENCOUNTER — Telehealth: Payer: Self-pay | Admitting: Family Medicine

## 2019-12-09 MED ORDER — OXYCODONE-ACETAMINOPHEN 10-325 MG PO TABS: 1.0000 | ORAL_TABLET | Freq: Four times a day (QID) | ORAL | 0 refills | Status: DC | PRN

## 2019-12-09 NOTE — Telephone Encounter (Signed)
Pt was sent to my voicemail, left a message requesting a refill of his Oxycodone to [facility].   Please advise, thanks.

## 2019-12-09 NOTE — Telephone Encounter (Signed)
Sent. Thanks.   

## 2019-12-29 DIAGNOSIS — M461 Sacroiliitis, not elsewhere classified: Secondary | ICD-10-CM | POA: Diagnosis not present

## 2020-03-26 ENCOUNTER — Encounter: Payer: Self-pay | Admitting: Cardiovascular Disease

## 2020-03-26 ENCOUNTER — Other Ambulatory Visit: Payer: Self-pay

## 2020-03-26 ENCOUNTER — Ambulatory Visit: Payer: Medicare PPO | Admitting: Cardiovascular Disease

## 2020-03-26 VITALS — BP 142/80 | HR 75 | Ht 69.0 in | Wt 211.0 lb

## 2020-03-26 DIAGNOSIS — I2510 Atherosclerotic heart disease of native coronary artery without angina pectoris: Secondary | ICD-10-CM

## 2020-03-26 DIAGNOSIS — E7849 Other hyperlipidemia: Secondary | ICD-10-CM | POA: Diagnosis not present

## 2020-03-26 DIAGNOSIS — I6523 Occlusion and stenosis of bilateral carotid arteries: Secondary | ICD-10-CM | POA: Diagnosis not present

## 2020-03-26 DIAGNOSIS — I10 Essential (primary) hypertension: Secondary | ICD-10-CM

## 2020-03-26 DIAGNOSIS — I493 Ventricular premature depolarization: Secondary | ICD-10-CM | POA: Diagnosis not present

## 2020-03-26 MED ORDER — ATORVASTATIN CALCIUM 80 MG PO TABS: 80.0000 mg | ORAL_TABLET | Freq: Every day | ORAL | 3 refills | Status: DC

## 2020-03-26 MED ORDER — METOPROLOL TARTRATE 50 MG PO TABS: 50.0000 mg | ORAL_TABLET | Freq: Two times a day (BID) | ORAL | 3 refills | Status: DC

## 2020-03-26 MED ORDER — RAMIPRIL 2.5 MG PO CAPS: 2.5000 mg | ORAL_CAPSULE | Freq: Two times a day (BID) | ORAL | 3 refills | Status: DC

## 2020-03-26 MED ORDER — CLOPIDOGREL BISULFATE 75 MG PO TABS: 75.0000 mg | ORAL_TABLET | Freq: Every day | ORAL | 3 refills | Status: DC

## 2020-03-26 NOTE — Progress Notes (Signed)
Chief Complaint  Patient presents with  . Follow-up    CAD   History of Present Illness: 87 yo male with history of CAD, HTN, HLD, PVCs here today for cardiac follow up. In 1998 he had PTCA of the LAD. In 2005 he had a drug-eluting stent placed in the LAD and then a short time later had a second drug-eluting stent placed in the LAD overlapping the first for an edge tear. He has moderate carotid artery disease by carotid artery dopplers July 2019. 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 He had Lyme disease in 2018. Echo March 2018 with normal LV systolic and diastolic function, mild MR.   He is here today for follow up. The patient denies any chest pain, dyspnea, palpitations, lower extremity edema, orthopnea, PND, dizziness, near syncope or syncope. He has been having back pain for a year and is being followed at [facility].    Primary Care Physician: Tonia Ghent, MD  Past Medical History:  Diagnosis Date  . CAD (coronary artery disease)    PTCA 1998 LAD-s/p overlapping DES in LAD in 05/2003  . Chest pain 5/5-5/11/2003   Hospital MI, CAD, INCR, CHOL, HTN  . Elevated glucose   . History of colonoscopy   . HTN (hypertension)   . Hyperlipidemia   . Impotence    of organic nature  . Otitis media   . Prostate cancer [facility])    s/p prostatectomy  . Spinal disease    lumbosacral    Past Surgical History:  Procedure Laterality Date  . ANGIOPLASTY  1992  . bone spur right foot     Bilateral  . CORONARY ANGIOPLASTY  06/02/2003   PTCA, occl of vessel w/good collaterals  . LAMINECTOMY    . left shoulder decompression    . pilonidal cystectomy  late 20s  . PROSTATECTOMY    . right carpal tunnel repair  11/18/2007   Dr. Daylene Katayama    Current Outpatient Medications  Medication Sig Dispense Refill  . calcium carbonate (OS-CAL) 600 MG TABS Take 600 mg by mouth daily.    . Fish Oil OIL Take 1 capsule by mouth daily.    Marland Kitchen  gabapentin (NEURONTIN) 300 MG capsule Take 1 capsule (300 mg total) by mouth 3 (three) times daily as needed.    Marland Kitchen ketoconazole (NIZORAL) 2 % cream Apply 1 application topically 2 (two) times daily. 30 g 0  . nitroGLYCERIN (NITROSTAT) 0.4 MG SL tablet Place 1 tablet (0.4 mg total) under the tongue every 5 (five) minutes as needed. For chest pain 25 tablet 6  . oxyCODONE-acetaminophen (PERCOCET) 10-325 MG tablet Take 1 tablet by mouth every 6 (six) hours as needed (sedation caution). 60 tablet 0  . atorvastatin (LIPITOR) 80 MG tablet Take 1 tablet (80 mg total) by mouth daily. 90 tablet 3  . clopidogrel (PLAVIX) 75 MG tablet Take 1 tablet (75 mg total) by mouth daily. 90 tablet 3  . metoprolol tartrate (LOPRESSOR) 50 MG tablet Take 1 tablet (50 mg total) by mouth 2 (two) times daily. 180 tablet 3  . ramipril (ALTACE) 2.5 MG capsule Take 1 capsule (2.5 mg total) by mouth 2 (two) times daily. 180 capsule 3   No current facility-administered medications for this visit.    Allergies  Allergen Reactions  . Doxycycline Rash  . Tetracycline Rash    Social History   Socioeconomic History  .  Marital status: Married    Spouse name: Not on file  . Number of children: 3  . Years of education: Not on file  . Highest education level: Not on file  Occupational History  . Occupation: retired-field coord for [employer]  Tobacco Use  . Smoking status: Never Smoker  . Smokeless tobacco: Current User    Types: Chew  Vaping Use  . Vaping Use: Never used  Substance and Sexual Activity  . Alcohol use: Yes    Alcohol/week: 2.0 - 3.0 standard drinks    Types: 2 - 3 Glasses of wine per week    Comment: occassionally  . Drug use: No  . Sexual activity: Never  Other Topics Concern  . 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    2 sons, 1 daughter,    6 grandkids   Social  Determinants of Radio broadcast assistant Strain: Not on file  Food Insecurity: Not on file  Transportation Needs: Not on file  Physical Activity: Not on file  Stress: Not on file  Social Connections: Not on file  Intimate Partner Violence: Not on file    Family History  Problem Relation Age of Onset  . Prostate cancer Father   . Cancer Father        prostate  . Colon cancer Mother   . Leukemia Mother   . Cancer Mother        colon, leukemia    Review of Systems:  As stated in the HPI and otherwise negative.   BP (!) 142/80   Pulse 75   Ht 5\' 9"  (1.753 m)   Wt 211 lb (95.7 kg)   SpO2 95%   BMI 31.16 kg/m   Physical Examination:  General: Well developed, well nourished, NAD  HEENT: OP clear, mucus membranes moist  SKIN: warm, dry. No rashes. Neuro: No focal deficits  Musculoskeletal: Muscle strength 5/5 all ext  Psychiatric: Mood and affect normal  Neck: No JVD, no carotid bruits, no thyromegaly, no lymphadenopathy.  Lungs:Clear bilaterally, no wheezes, rhonci, crackles Cardiovascular: Regular rate and rhythm. No murmurs, gallops or rubs. Abdomen:Soft. Bowel sounds present. Non-tender.  Extremities: No lower extremity edema. Pulses are 2 + in the bilateral DP/PT.  Echo March 2018: - Left ventricle: The cavity size was normal. Systolic function was   normal. The estimated ejection fraction was in the range of 60%   to 65%. Wall motion was normal; there were no regional wall   motion abnormalities. Left ventricular diastolic function   parameters were normal. - Mitral valve: There was mild regurgitation. - Atrial septum: No defect or patent foramen ovale was identified.  EKG:  EKG is ordered today The ekg ordered today demonstrates Sinus  Recent Labs: No results found for requested labs within last 8760 hours.   Lipid Panel    Component Value Date/Time   CHOL 109 08/19/2018 0802   CHOL 120 04/24/2016 0734   TRIG 94.0 08/19/2018 0802   HDL 37.60 (L)  08/19/2018 0802   HDL 31 (L) 04/24/2016 0734   CHOLHDL 3 08/19/2018 0802   VLDL 18.8 08/19/2018 0802   LDLCALC 53 08/19/2018 0802   LDLCALC 67 04/24/2016 0734     Wt Readings from Last 3 Encounters:  03/26/20 211 lb (95.7 kg)  06/30/19 208 lb 6 oz (94.5 kg)  02/09/19 213 lb  12.8 oz (97 kg)     Other studies Reviewed: Additional studies/ records that were reviewed today include: . Review of the above records demonstrates:   Assessment and Plan:   1. CAD without angina: No chest pain. LV function normal by echo in 2018. Will continue Plavix, statin and beta blocker  2. HTN: BP is controlled. He has not taken his medications in several days.   3. HLD: LDL at goal July 2020.  Continue statin. Repeat lipids and LFTs now.   4. Carotid artery disease: Stable mild to moderate carotid disease by dopplers July 2019.  Repeat now  5. PVCs: He has no palpitations. Continue beta blocker  Current medicines are reviewed at length with the patient today.  The patient does not have concerns regarding medicines.  The following changes have been made:  no change  Labs/ tests ordered today include:   Orders Placed This Encounter  Procedures  . Hepatic function panel  . Lipid panel  . EKG 12-Lead  . VAS US CAROTID     Disposition:   FU with me in 12  months   Signed, Lauree Chandler, MD 03/26/2020 11:34 AM    [facility] [address], [place], [place]  [id number] Phone: [phone]; Fax: [phone]

## 2020-03-26 NOTE — Patient Instructions (Signed)
Medication Instructions:  No changes *If you need a refill on your cardiac medications before your next appointment, please call your pharmacy*   Lab Work: Please return for fasting labs (lipids/liver function)  If you have labs (blood work) drawn today and your tests are completely normal, you will receive your results only by: Marland Kitchen MyChart Message (if you have MyChart) OR . A paper copy in the mail If you have any lab test that is abnormal or we need to change your treatment, we will call you to review the results.   Testing/Procedures: DUE IN [place] Your physician has requested that you have a carotid duplex. This test is an ultrasound of the carotid arteries in your neck. It looks at blood flow through these arteries that supply the brain with blood. Allow one hour for this exam. There are no restrictions or special instructions.   Follow-Up: At [facility], you and your health needs are our priority.  As part of our continuing mission to provide you with exceptional heart care, we have created designated Provider Care Teams.  These Care Teams include your primary Cardiologist (physician) and Advanced Practice Providers (APPs -  Physician Assistants and Nurse Practitioners) who all work together to provide you with the care you need, when you need it.  We recommend signing up for the patient portal called "MyChart".  Sign up information is provided on this After Visit Summary.  MyChart is used to connect with patients for Virtual Visits (Telemedicine).  Patients are able to view lab/test results, encounter notes, upcoming appointments, etc.  Non-urgent messages can be sent to your provider as well.   To learn more about what you can do with MyChart, go to [email or web address].    Your next appointment:   12 month(s)  The format for your next appointment:   In Person  Provider:   You may see Lauree Chandler, MD or one of the following Advanced Practice Providers on your  designated Care Team:    Melina Copa, PA-C  Ermalinda Barrios, PA-C   Other Instructions

## 2020-03-27 ENCOUNTER — Other Ambulatory Visit: Payer: Medicare PPO

## 2020-03-27 DIAGNOSIS — I493 Ventricular premature depolarization: Secondary | ICD-10-CM

## 2020-03-27 DIAGNOSIS — I6523 Occlusion and stenosis of bilateral carotid arteries: Secondary | ICD-10-CM

## 2020-03-27 DIAGNOSIS — E7849 Other hyperlipidemia: Secondary | ICD-10-CM

## 2020-03-27 DIAGNOSIS — I10 Essential (primary) hypertension: Secondary | ICD-10-CM | POA: Diagnosis not present

## 2020-03-27 DIAGNOSIS — I2510 Atherosclerotic heart disease of native coronary artery without angina pectoris: Secondary | ICD-10-CM

## 2020-03-27 LAB — HEPATIC FUNCTION PANEL
ALT: 30 IU/L (ref 0–44)
AST: 18 IU/L (ref 0–40)
Albumin: 4.3 g/dL (ref 3.6–4.6)
Alkaline Phosphatase: 120 IU/L (ref 44–121)
Bilirubin Total: 0.9 mg/dL (ref 0.0–1.2)
Bilirubin, Direct: 0.22 mg/dL (ref 0.00–0.40)
Total Protein: 6.5 g/dL (ref 6.0–8.5)

## 2020-03-27 LAB — LIPID PANEL
Chol/HDL Ratio: 3.2 ratio (ref 0.0–5.0)
Cholesterol, Total: 126 mg/dL (ref 100–199)
HDL: 40 mg/dL (ref >39)
LDL Chol Calc (NIH): 66 mg/dL (ref 0–99)
Triglycerides: 108 mg/dL (ref 0–149)
VLDL Cholesterol Cal: 20 mg/dL (ref 5–40)

## 2020-03-30 ENCOUNTER — Encounter: Payer: Self-pay | Admitting: *Deleted

## 2020-04-02 DIAGNOSIS — H04123 Dry eye syndrome of bilateral lacrimal glands: Secondary | ICD-10-CM | POA: Diagnosis not present

## 2020-04-02 DIAGNOSIS — H0288A Meibomian gland dysfunction right eye, upper and lower eyelids: Secondary | ICD-10-CM | POA: Diagnosis not present

## 2020-04-02 DIAGNOSIS — H43392 Other vitreous opacities, left eye: Secondary | ICD-10-CM | POA: Diagnosis not present

## 2020-04-02 DIAGNOSIS — H2513 Age-related nuclear cataract, bilateral: Secondary | ICD-10-CM | POA: Diagnosis not present

## 2020-04-02 DIAGNOSIS — H0288B Meibomian gland dysfunction left eye, upper and lower eyelids: Secondary | ICD-10-CM | POA: Diagnosis not present

## 2020-04-02 LAB — HM DIABETES EYE EXAM

## 2020-04-27 ENCOUNTER — Telehealth: Payer: Self-pay | Admitting: Family Medicine

## 2020-04-27 NOTE — Telephone Encounter (Signed)
LOV - 06/30/19 NOV -  not scheduled  Last refilled - 12/09/19 #60/0

## 2020-04-27 NOTE — Telephone Encounter (Signed)
Patient called my direct line and left a voicemail requesting a refill of his Oxycodone to [facility].   Please ask patient to not call the referral line to request refills of these medications - If I am not in the office for an extended time frame these messages can be overlooked and refills delayed. He needs to be calling the main number.   Please advise, thanks.

## 2020-04-29 MED ORDER — OXYCODONE-ACETAMINOPHEN 10-325 MG PO TABS
1.0000 | ORAL_TABLET | Freq: Four times a day (QID) | ORAL | 0 refills | Status: DC | PRN
Start: 2020-04-29 — End: 2020-07-05

## 2020-04-29 NOTE — Telephone Encounter (Signed)
Sent. Thanks.  Needs yearly visit this summer when possible.

## 2020-04-30 NOTE — Telephone Encounter (Signed)
Called [name] and got him scheduled for CPE labs 6/ 2 @ 8:45 and CPE 6/ 9 @ 10:30 am

## 2020-05-06 DIAGNOSIS — M25552 Pain in left hip: Secondary | ICD-10-CM | POA: Diagnosis not present

## 2020-05-06 DIAGNOSIS — M25551 Pain in right hip: Secondary | ICD-10-CM | POA: Diagnosis not present

## 2020-05-06 DIAGNOSIS — G8929 Other chronic pain: Secondary | ICD-10-CM | POA: Diagnosis not present

## 2020-05-06 DIAGNOSIS — F17210 Nicotine dependence, cigarettes, uncomplicated: Secondary | ICD-10-CM | POA: Diagnosis not present

## 2020-05-06 DIAGNOSIS — M545 Low back pain, unspecified: Secondary | ICD-10-CM | POA: Diagnosis not present

## 2020-05-18 DIAGNOSIS — M5416 Radiculopathy, lumbar region: Secondary | ICD-10-CM | POA: Diagnosis not present

## 2020-05-18 DIAGNOSIS — M25551 Pain in right hip: Secondary | ICD-10-CM | POA: Diagnosis not present

## 2020-05-21 DIAGNOSIS — G8929 Other chronic pain: Secondary | ICD-10-CM | POA: Diagnosis not present

## 2020-05-21 DIAGNOSIS — M25551 Pain in right hip: Secondary | ICD-10-CM | POA: Diagnosis not present

## 2020-05-21 DIAGNOSIS — M25552 Pain in left hip: Secondary | ICD-10-CM | POA: Diagnosis not present

## 2020-05-21 DIAGNOSIS — I10 Essential (primary) hypertension: Secondary | ICD-10-CM | POA: Diagnosis not present

## 2020-05-21 DIAGNOSIS — M545 Low back pain, unspecified: Secondary | ICD-10-CM | POA: Diagnosis not present

## 2020-05-31 ENCOUNTER — Ambulatory Visit: Payer: Medicare PPO | Admitting: Orthopedic Surgery

## 2020-05-31 ENCOUNTER — Encounter: Payer: Self-pay | Admitting: Orthopedic Surgery

## 2020-05-31 ENCOUNTER — Ambulatory Visit: Payer: Self-pay

## 2020-05-31 VITALS — Ht 69.0 in | Wt 211.0 lb

## 2020-05-31 DIAGNOSIS — M25551 Pain in right hip: Secondary | ICD-10-CM

## 2020-05-31 DIAGNOSIS — M5137 Other intervertebral disc degeneration, lumbosacral region: Secondary | ICD-10-CM | POA: Diagnosis not present

## 2020-06-04 ENCOUNTER — Encounter: Payer: Self-pay | Admitting: Orthopedic Surgery

## 2020-06-04 NOTE — Progress Notes (Signed)
Office Visit Note   Patient: [name].           Date of Birth: 10-18-1932           MRN: [id number] Visit Date: 05/31/2020              Requested by: Tonia Ghent, MD [address] [place],  [place] [address] PCP: Tonia Ghent, MD  Chief Complaint  Patient presents with  . Right Hip - Pain      HPI: Patient is an 87 year old gentleman who presents complaining of lateral right hip pain for about a week.  He states he went to urgent care on Sunday states the pain is worse late in the evening or with carrying objects he denies any groin pain he states the pain does not radiate down his leg he denies any numbness or tinglin he has been using meloxicam.  Assessment & Plan: Visit Diagnoses:  1. Pain in right hip   2. DEGENERATIVE DISC DISEASE, LUMBAR SPINE     Plan: We will make an appointment with Dr. Ernestina Patches for an injection of the right hip to differentiate between hip pain and his lumbar spine.  Follow-Up Instructions: No follow-ups on file.   Ortho Exam  Patient is alert, oriented, no adenopathy, well-dressed, normal affect, normal respiratory effort. On examination patient does have an abductor lurch on the right he has pain to palpation over the greater trochanter.  Patient has a negative straight leg raise on the right no focal motor weakness.  He has a little bit of decreased range of motion of the right hip compared to the left with internal rotation of 20 degrees of the right hip left hip 30 degrees external rotation of the right hip about 45 degrees external rotation of the left hip about 50 degrees.  Patient states that his initial symptoms for his lumbar spine surgery was right-sided radicular pain.  Imaging: No results found. No images are attached to the encounter.  Labs: Lab Results  Component Value Date   HGBA1C 6.4 08/19/2018   HGBA1C 6.6 (H) 08/07/2017   HGBA1C 6.3 11/28/2016     Lab Results  Component Value Date   ALBUMIN 4.3  03/27/2020   ALBUMIN 4.6 08/19/2018   ALBUMIN 4.2 08/07/2017    No results found for: MG Lab Results  Component Value Date   VD25OH 35 10/26/2008    No results found for: PREALBUMIN CBC EXTENDED Latest Ref Rng & Units 09/28/2015 10/06/2014 12/02/2010  WBC 4.0 - 10.5 K/uL 7.5 - 5.9  RBC 4.22 - 5.81 Mil/uL 4.30 - 4.38  HGB 13.0 - 17.0 g/dL 13.4 13.6 13.6  HCT 39.0 - 52.0 % 39.6 40.0 40.0  PLT 150.0 - 400.0 K/uL 181.0 - 155.0  NEUTROABS 1.4 - 7.7 K/uL 4.3 - 2.8  LYMPHSABS 0.7 - 4.0 K/uL 2.1 - 2.3     Body mass index is 31.16 kg/m.  Orders:  Orders Placed This Encounter  Procedures  . XR HIP UNILAT W OR W/O PELVIS 2-3 VIEWS RIGHT  . XR Lumbar Spine 2-3 Views  . Ambulatory referral to Physical Medicine Rehab   No orders of the defined types were placed in this encounter.    Procedures: No procedures performed  Clinical Data: No additional findings.  ROS:  All other systems negative, except as noted in the HPI. Review of Systems  Objective: Vital Signs: Ht 5\' 9"  (1.753 m)   Wt 211 lb (95.7 kg)  BMI 31.16 kg/m   Specialty Comments:  No specialty comments available.  PMFS History: Patient Active Problem List   Diagnosis Date Noted  . Superficial fungal infection of skin 07/03/2019  . Hyperkalemia 09/02/2018  . Advance care planning 06/21/2018  . Shingles 03/16/2018  . Wart 08/25/2017  . Carotid artery disorder ([place]) 08/25/2017  . Healthcare maintenance 08/13/2016  . History of diabetes mellitus 08/13/2016  . Encounter for chronic pain management 08/13/2016  . Medicare annual wellness visit, subsequent 04/03/2015  . Right sided sciatica 06/14/2014  . Low back pain 07/30/2011  . BCC (basal cell carcinoma), back 12/18/2010  . CAD (coronary artery disease) 09/13/2010  . DEGENERATIVE DISC DISEASE, LUMBAR SPINE 03/18/2007  . NEOP, MALIGNANT, PROSTATE 11/26/2006  . [place], MIXED 11/26/2006  . HYPERTENSION, BENIGN ESSENTIAL 11/26/2006  . ERECTILE  DYSFUNCTION, ORGANIC 11/26/2006   Past Medical History:  Diagnosis Date  . CAD (coronary artery disease)    PTCA 1998 LAD-s/p overlapping DES in LAD in 05/2003  . Chest pain 5/5-5/11/2003   Hospital MI, CAD, INCR, CHOL, HTN  . Elevated glucose   . History of colonoscopy   . HTN (hypertension)   . Hyperlipidemia   . Impotence    of organic nature  . Otitis media   . Prostate cancer [facility])    s/p prostatectomy  . Spinal disease    lumbosacral    Family History  Problem Relation Age of Onset  . Prostate cancer Father   . Cancer Father        prostate  . Colon cancer Mother   . Leukemia Mother   . Cancer Mother        colon, leukemia    Past Surgical History:  Procedure Laterality Date  . ANGIOPLASTY  1992  . bone spur right foot     Bilateral  . CORONARY ANGIOPLASTY  06/02/2003   PTCA, occl of vessel w/good collaterals  . LAMINECTOMY    . left shoulder decompression    . pilonidal cystectomy  late 20s  . PROSTATECTOMY    . right carpal tunnel repair  11/18/2007   Dr. Daylene Katayama   Social History   Occupational History  . Occupation: retired-field coord for [employer]  Tobacco Use  . Smoking status: Never Smoker  . Smokeless tobacco: Current User    Types: Chew  Vaping Use  . Vaping Use: Never used  Substance and Sexual Activity  . Alcohol use: Yes    Alcohol/week: 2.0 - 3.0 standard drinks    Types: 2 - 3 Glasses of wine per week    Comment: occassionally  . Drug use: No  . Sexual activity: Never

## 2020-06-11 IMAGING — CR DG LUMBAR SPINE 2-3V
3 series · 3 of 3 positions shown · non-contrast
Comparison: Lumbar spine MRI 07/20/2017

CLINICAL DATA: Buttock and radicular pain down right leg to the
ankle.

EXAM:
LUMBAR SPINE - 2-3 VIEW

[w lumbar spine ap]
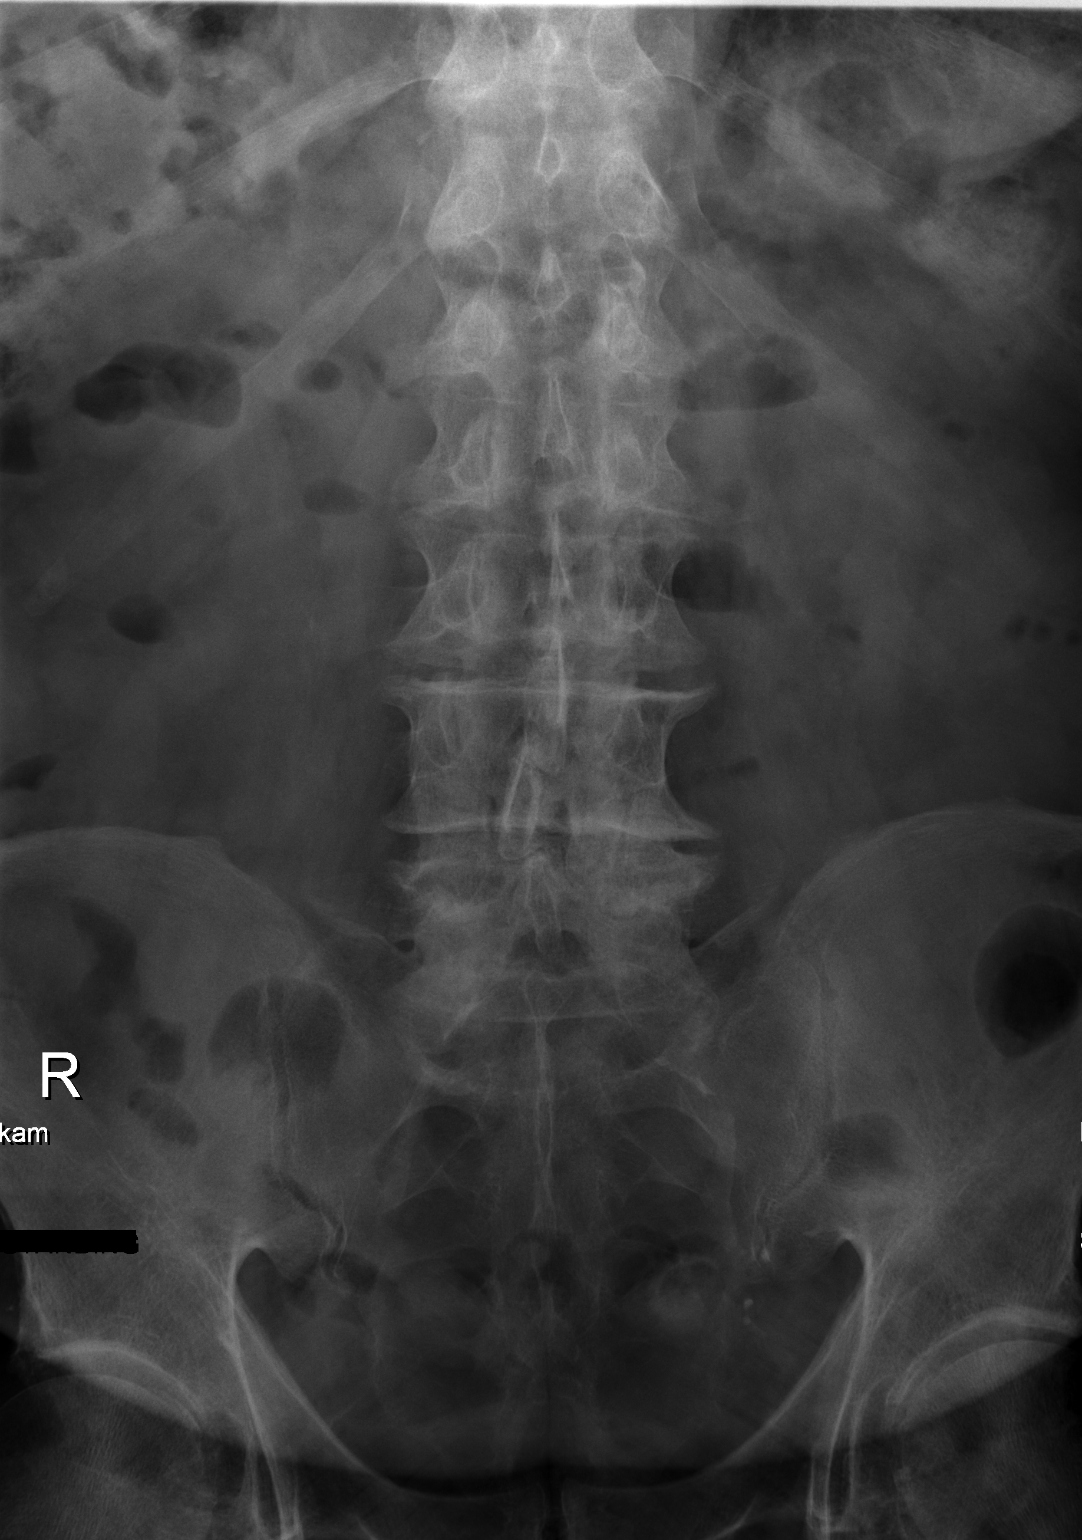

[w lumbar spine lat]
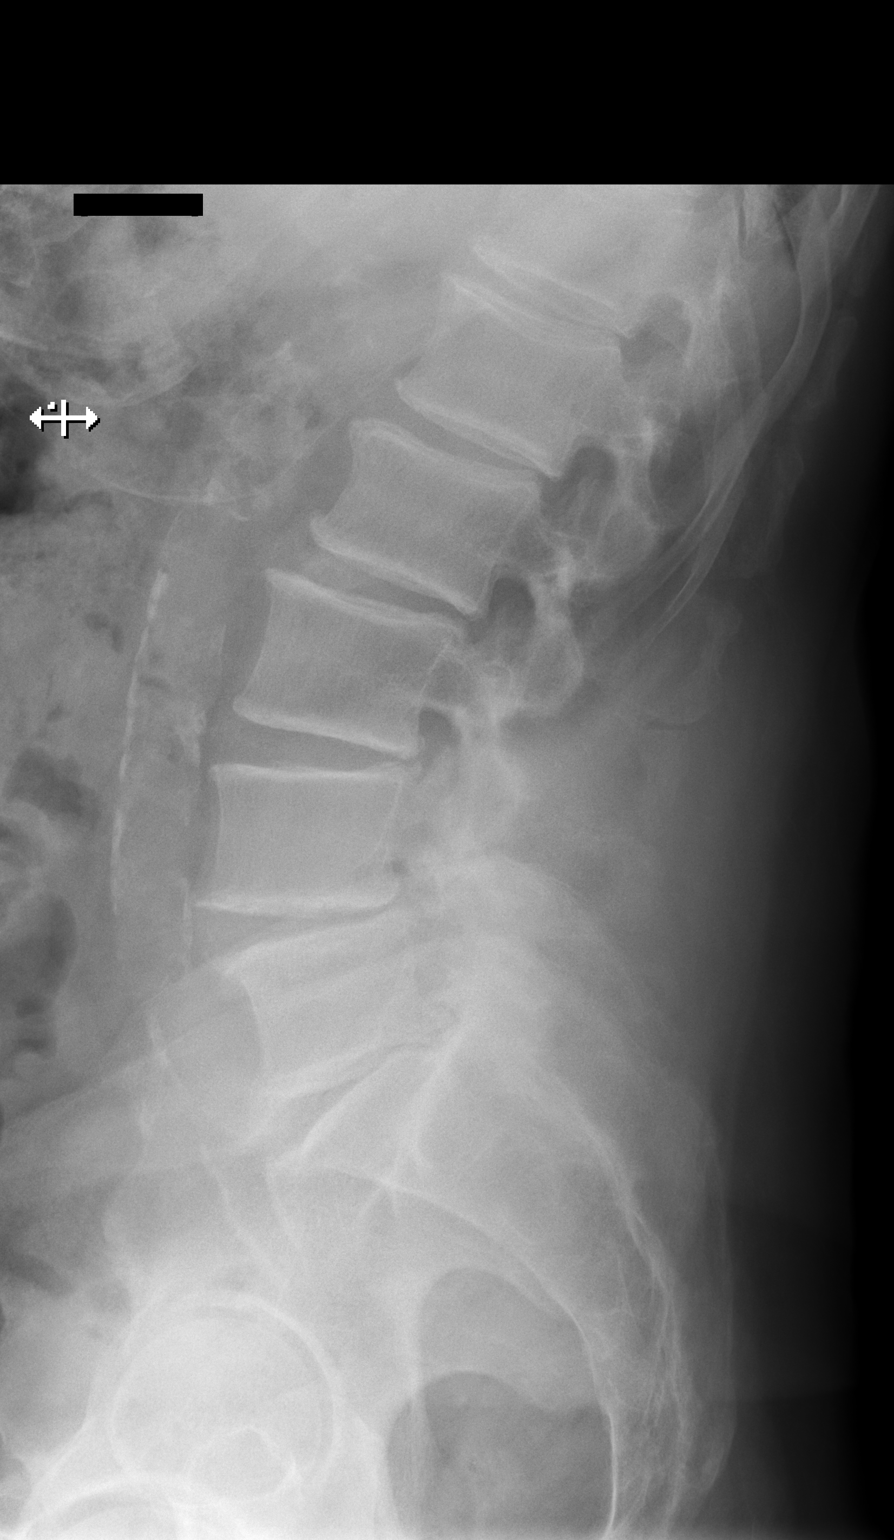

[w lumbar l-5 s-1 spot]
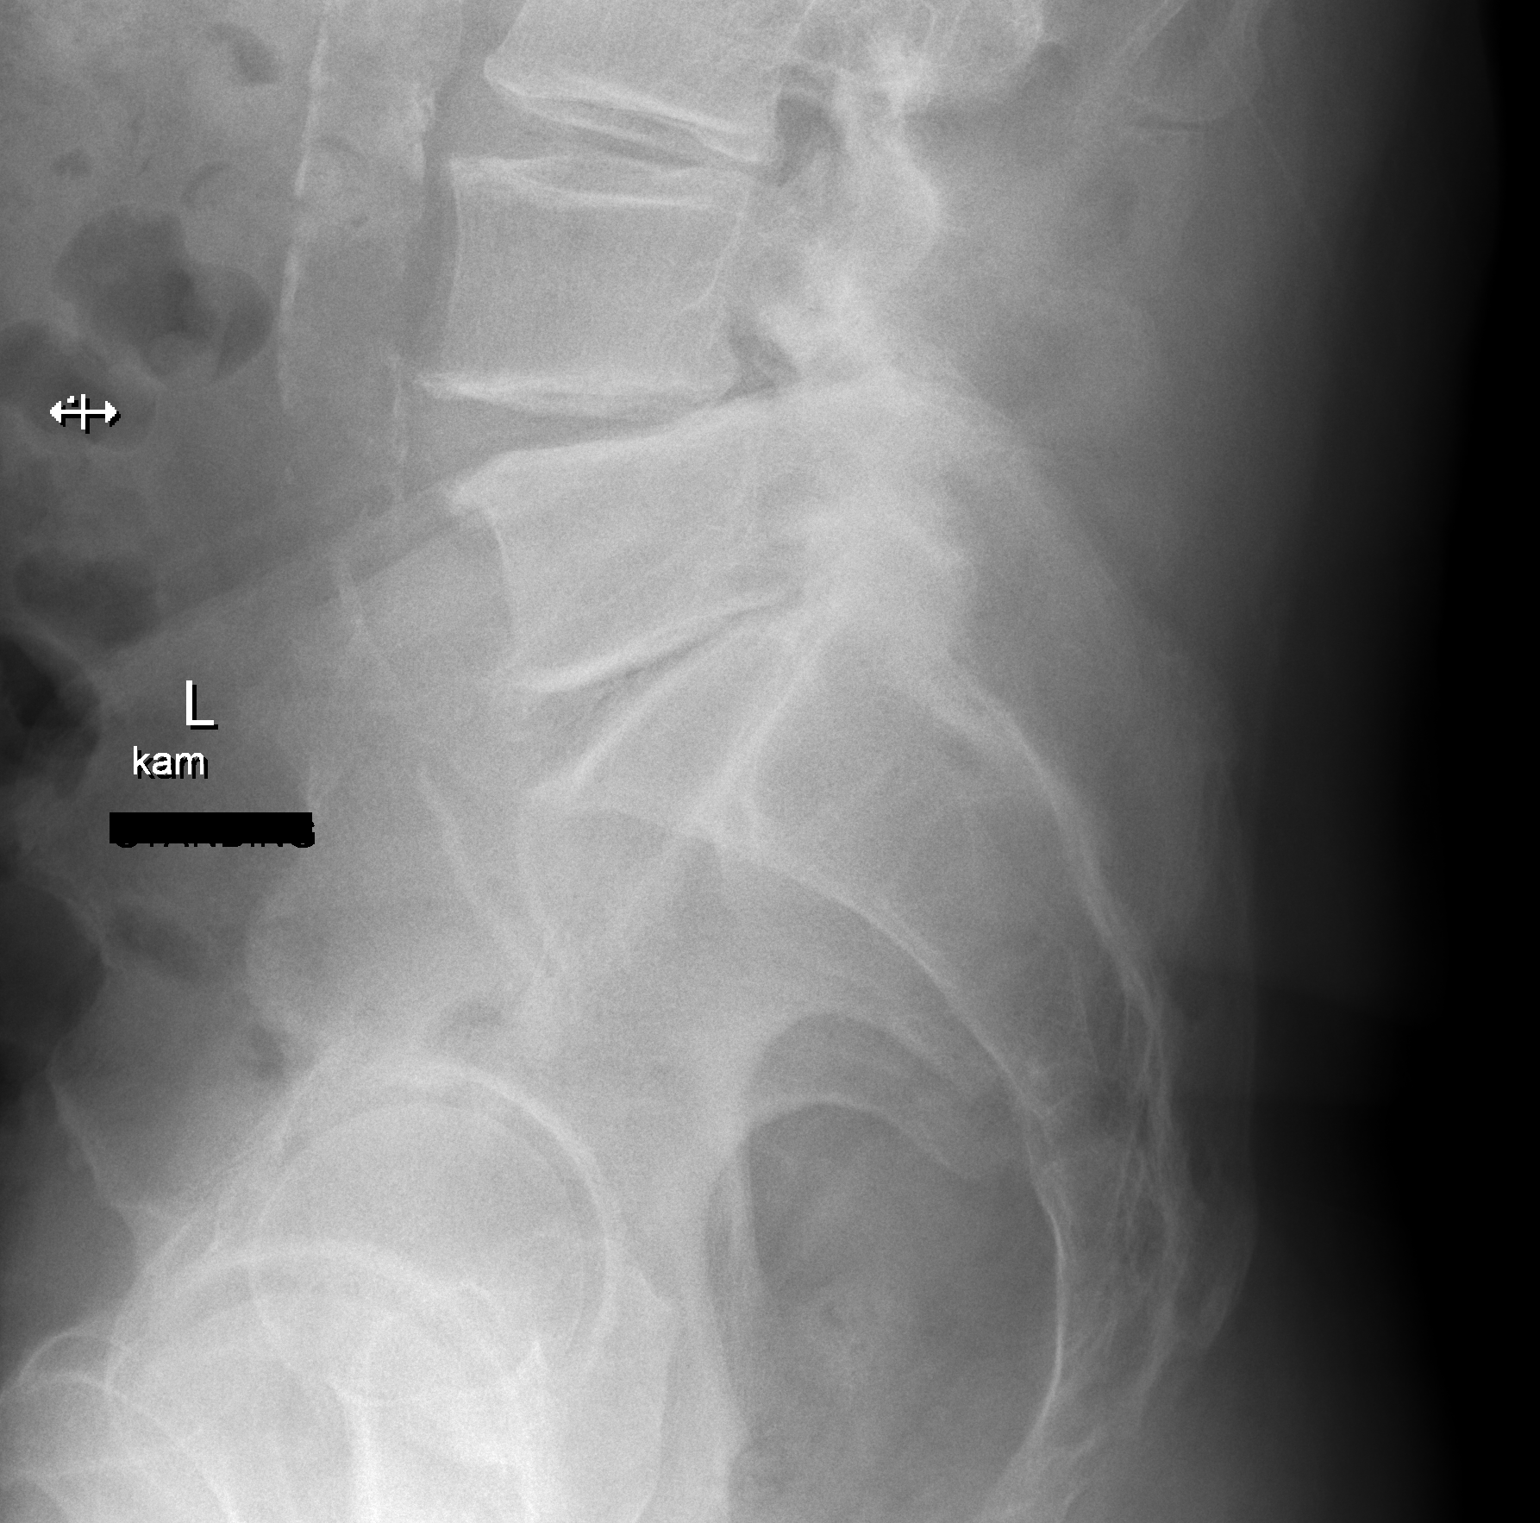

[3 of 3 positions shown; findings below may reference images not displayed]

FINDINGS: Mild-to-moderate disc flattening of the intervertebral discs of the
lumbar spine. No acute fracture or suspicious osseous lesions. There
are 5 non ribbed lumbar vertebrae in normal lumbar lordosis. No pars
defects. Joint space narrowing and sclerosis of the L3 through S1
facets. The included SI joints are intact. Aortic atherosclerosis of
the abdominal aorta and common iliac arteries without aneurysmal
dilatation noted radiographically.
IMPRESSION: Multilevel degenerative disc disease of the lumbar spine with L3
through S1 facet arthropathy. No acute osseous abnormality.

## 2020-06-18 ENCOUNTER — Other Ambulatory Visit: Payer: Self-pay

## 2020-06-18 ENCOUNTER — Ambulatory Visit (INDEPENDENT_AMBULATORY_CARE_PROVIDER_SITE_OTHER): Payer: Medicare PPO | Admitting: Physical Medicine and Rehabilitation

## 2020-06-18 ENCOUNTER — Encounter: Payer: Self-pay | Admitting: Physical Medicine and Rehabilitation

## 2020-06-18 ENCOUNTER — Ambulatory Visit: Payer: Self-pay

## 2020-06-18 DIAGNOSIS — M25551 Pain in right hip: Secondary | ICD-10-CM | POA: Diagnosis not present

## 2020-06-18 NOTE — Progress Notes (Signed)
   [name]. - 87 y.o. male MRN [id number]  Date of birth: [birth year]  Office Visit Note: Visit Date: 06/18/2020 PCP: Tonia Ghent, MD Referred by: Tonia Ghent, MD  Subjective: Chief Complaint  Patient presents with  . Right Hip - Pain   HPI:  [name]. is a 87 y.o. male who comes in today at the request of Dr. Meridee Score, MD for planned Right anesthetic hip arthrogram with fluoroscopic guidance.  The patient has failed conservative care including home exercise, medications, time and activity modification.  This injection will be diagnostic and hopefully therapeutic.  Please see requesting physician notes for further details and justification.  Patient with prior history of lumbar fusion L3 to the sacrum Dr. Glenna Fellows.  Patient did have sacroiliac joint injections at [facility] at the end of the last year in December without any relief.   ROS Otherwise per HPI.  Assessment & Plan: Visit Diagnoses:    ICD-10-CM   1. Pain in right hip  M25.551 XR C-ARM NO REPORT    Plan: No additional findings.   Meds & Orders: No orders of the defined types were placed in this encounter.   Orders Placed This Encounter  Procedures  . Large Joint Inj  . XR C-ARM NO REPORT    Follow-up: No follow-ups on file.   Procedures: Large Joint Inj: R hip joint on 06/18/2020 10:00 AM Indications: diagnostic evaluation and pain Details: 22 G 3.5 in needle, fluoroscopy-guided anterior approach  Arthrogram: No  Medications: 4 mL bupivacaine 0.25 %; 60 mg triamcinolone acetonide 40 MG/ML Outcome: tolerated well, no immediate complications  There was excellent flow of contrast producing a partial arthrogram of the hip.  He cannot tell much difference with pain relief during the anesthetic phase because his symptoms are not constant. Procedure, treatment alternatives, risks and benefits explained, specific risks discussed. Consent was given by the patient. Immediately prior to procedure a time  out was called to verify the correct patient, procedure, equipment, support staff and site/side marked as required. Patient was prepped and draped in the usual sterile fashion.          Clinical History: No specialty comments available.     Objective:  VS:  HT:    WT:   BMI:     BP:   HR: bpm  TEMP: ( )  RESP:  Physical Exam   Imaging: XR C-ARM NO REPORT  Result Date: 06/18/2020 Please see Notes tab for imaging impression.

## 2020-06-18 NOTE — Progress Notes (Signed)
Pt state right hip pain. Pt state walking and any movement makes the pain worse. Pt state he take pain meds to help ease the pain.   Numeric Pain Rating Scale and Functional Assessment Average Pain 2   In the last MONTH (on 0-10 scale) has pain interfered with the following?  1. General activity like being  able to carry out your everyday physical activities such as walking, climbing stairs, carrying groceries, or moving a chair?  Rating(8)    -BT, -Dye Allergies.

## 2020-06-19 MED ORDER — TRIAMCINOLONE ACETONIDE 40 MG/ML IJ SUSP
60.0000 mg | INTRAMUSCULAR | Status: AC | PRN
  Administered 2020-06-18: 60 mg via INTRA_ARTICULAR

## 2020-06-19 MED ORDER — BUPIVACAINE HCL 0.25 % IJ SOLN
4.0000 mL | INTRAMUSCULAR | Status: AC | PRN
  Administered 2020-06-18: 4 mL via INTRA_ARTICULAR

## 2020-06-27 ENCOUNTER — Other Ambulatory Visit: Payer: Self-pay | Admitting: Family Medicine

## 2020-06-27 DIAGNOSIS — Z8639 Personal history of other endocrine, nutritional and metabolic disease: Secondary | ICD-10-CM

## 2020-06-27 DIAGNOSIS — Z125 Encounter for screening for malignant neoplasm of prostate: Secondary | ICD-10-CM

## 2020-06-27 DIAGNOSIS — I10 Essential (primary) hypertension: Secondary | ICD-10-CM

## 2020-06-28 ENCOUNTER — Other Ambulatory Visit: Payer: Self-pay

## 2020-06-28 ENCOUNTER — Other Ambulatory Visit (INDEPENDENT_AMBULATORY_CARE_PROVIDER_SITE_OTHER): Payer: Medicare PPO

## 2020-06-28 DIAGNOSIS — I10 Essential (primary) hypertension: Secondary | ICD-10-CM

## 2020-06-28 DIAGNOSIS — Z125 Encounter for screening for malignant neoplasm of prostate: Secondary | ICD-10-CM | POA: Diagnosis not present

## 2020-06-28 DIAGNOSIS — Z8639 Personal history of other endocrine, nutritional and metabolic disease: Secondary | ICD-10-CM

## 2020-06-28 LAB — PSA, MEDICARE: PSA: 0.01 ng/ml — ABNORMAL LOW (ref 0.10–4.00)

## 2020-06-28 LAB — HEMOGLOBIN A1C: Hgb A1c MFr Bld: 7.7 % — ABNORMAL HIGH (ref 4.6–6.5)

## 2020-06-29 LAB — BASIC METABOLIC PANEL
BUN: 12 mg/dL (ref 6–23)
CO2: 23 mEq/L (ref 19–32)
Calcium: 9.2 mg/dL (ref 8.4–10.5)
Chloride: 105 mEq/L (ref 96–112)
Creatinine, Ser: 0.8 mg/dL (ref 0.40–1.50)
GFR: 80.65 mL/min (ref >60.00)
Glucose, Bld: 155 mg/dL — ABNORMAL HIGH (ref 70–99)
Potassium: 4.9 mEq/L (ref 3.5–5.1)
Sodium: 139 mEq/L (ref 135–145)

## 2020-07-05 ENCOUNTER — Ambulatory Visit (INDEPENDENT_AMBULATORY_CARE_PROVIDER_SITE_OTHER): Payer: Medicare PPO | Admitting: Family Medicine

## 2020-07-05 ENCOUNTER — Other Ambulatory Visit: Payer: Self-pay

## 2020-07-05 ENCOUNTER — Encounter: Payer: Self-pay | Admitting: Family Medicine

## 2020-07-05 VITALS — BP 126/68 | HR 53 | Temp 97.0°F | Ht 69.0 in | Wt 203.0 lb

## 2020-07-05 DIAGNOSIS — M5137 Other intervertebral disc degeneration, lumbosacral region: Secondary | ICD-10-CM | POA: Diagnosis not present

## 2020-07-05 DIAGNOSIS — E119 Type 2 diabetes mellitus without complications: Secondary | ICD-10-CM | POA: Diagnosis not present

## 2020-07-05 DIAGNOSIS — E782 Mixed hyperlipidemia: Secondary | ICD-10-CM

## 2020-07-05 DIAGNOSIS — Z7189 Other specified counseling: Secondary | ICD-10-CM

## 2020-07-05 DIAGNOSIS — I10 Essential (primary) hypertension: Secondary | ICD-10-CM

## 2020-07-05 DIAGNOSIS — M51379 Other intervertebral disc degeneration, lumbosacral region without mention of lumbar back pain or lower extremity pain: Secondary | ICD-10-CM

## 2020-07-05 DIAGNOSIS — Z0000 Encounter for general adult medical examination without abnormal findings: Secondary | ICD-10-CM

## 2020-07-05 DIAGNOSIS — I779 Disorder of arteries and arterioles, unspecified: Secondary | ICD-10-CM

## 2020-07-05 MED ORDER — OXYCODONE-ACETAMINOPHEN 10-325 MG PO TABS: 1.0000 | ORAL_TABLET | Freq: Four times a day (QID) | ORAL | 0 refills | Status: DC | PRN

## 2020-07-05 NOTE — Patient Instructions (Signed)
Keep working on diet and recheck A1c at a visit in about 4 months.  No labs ahead of time.  Take care.  Glad to see you.

## 2020-07-05 NOTE — Progress Notes (Signed)
This visit occurred during the SARS-CoV-2 public health emergency.  Safety protocols were in place, including screening questions prior to the visit, additional usage of staff PPE, and extensive cleaning of exam room while observing appropriate contact time as indicated for disinfecting solutions.  Elevated Cholesterol: Using medications without problems: yes Muscle aches: not from statin Diet compliance: yes Exercise: yes  Hypertension:    Using medication without problems or lightheadedness: rarely lightheaded on getting out of bed, cautions d/w pt.  Chest pain with exertion:no Edema: no  Short of breath: no  Diabetes:  No meds.   Hypoglycemic episodes: no Hyperglycemic episodes: no Feet problems: some numbness in R foot episodically after prev injury years ago.  No sx currently.   Blood Sugars averaging: not checked.   eye exam within last year: yes, Dr. Katy Fitch.   Pain.  Occ oxycodone use, he limits that.  Not daily use.  Taking meloxicam at baseline.  He has back pain at baseline.  No ADE on oxycodone.  NSAID and opiate cautions discussed with patient.  Carotid study pending, per cardiology.  I will defer.  He agrees.  Vaccines d/w pt.   PSA low, d/w pt.   Colonoscopy not due.  Advance directive-wife designated if patient were incapacitated  PMH and SH reviewed Meds, vitals, and allergies reviewed.   ROS: Per HPI unless specifically indicated in ROS section   GEN: nad, alert and oriented HEENT: ncat NECK: supple w/o LA CV: rrr. PULM: ctab, no inc wob ABD: soft, +bs EXT: no edema SKIN: no acute rash  Diabetic foot exam: Normal inspection No skin breakdown No calluses  Normal DP pulses Normal sensation to light touch and monofilament Nails normal

## 2020-07-08 NOTE — Assessment & Plan Note (Signed)
  Carotid study pending, per cardiology.  I will defer.  He agrees.

## 2020-07-08 NOTE — Assessment & Plan Note (Signed)
Advance directive- wife designated if patient were incapacitated.  

## 2020-07-08 NOTE — Assessment & Plan Note (Signed)
Continue work on diet.  Continue atorvastatin.  He agrees.

## 2020-07-08 NOTE — Assessment & Plan Note (Signed)
Occ oxycodone use, he limits that.  Not daily use.  Taking meloxicam at baseline.  He has back pain at baseline.  No ADE on oxycodone.  Would continue oxycodone and meloxicam at baseline.

## 2020-07-08 NOTE — Assessment & Plan Note (Signed)
Continue metoprolol and ramipril.

## 2020-07-08 NOTE — Assessment & Plan Note (Signed)
  Vaccines d/w pt.   PSA low, d/w pt.   Colonoscopy not due.  Advance directive-wife designated if patient were incapacitated

## 2020-07-08 NOTE — Assessment & Plan Note (Signed)
No meds.  He will work more on diet and recheck labs in about 4 months that visit.  See after visit summary.  He agrees.  Labs discussed with patient.

## 2020-08-20 ENCOUNTER — Other Ambulatory Visit: Payer: Self-pay

## 2020-08-20 ENCOUNTER — Ambulatory Visit (HOSPITAL_COMMUNITY)
Admission: RE | Admit: 2020-08-20 | Discharge: 2020-08-20 | Disposition: A | Discharge status: Home / Self Care | Payer: Medicare PPO | Source: Ambulatory Visit | Attending: Cardiovascular Disease | Admitting: Cardiovascular Disease

## 2020-08-20 ENCOUNTER — Other Ambulatory Visit (HOSPITAL_COMMUNITY): Payer: Self-pay | Admitting: Cardiovascular Disease

## 2020-08-20 DIAGNOSIS — I10 Essential (primary) hypertension: Secondary | ICD-10-CM | POA: Insufficient documentation

## 2020-08-20 DIAGNOSIS — I6523 Occlusion and stenosis of bilateral carotid arteries: Secondary | ICD-10-CM | POA: Diagnosis not present

## 2020-08-20 DIAGNOSIS — I2510 Atherosclerotic heart disease of native coronary artery without angina pectoris: Secondary | ICD-10-CM

## 2020-08-20 DIAGNOSIS — I493 Ventricular premature depolarization: Secondary | ICD-10-CM

## 2020-08-20 DIAGNOSIS — E7849 Other hyperlipidemia: Secondary | ICD-10-CM | POA: Diagnosis not present

## 2020-08-21 DIAGNOSIS — M545 Low back pain, unspecified: Secondary | ICD-10-CM | POA: Diagnosis not present

## 2020-08-21 DIAGNOSIS — G8929 Other chronic pain: Secondary | ICD-10-CM | POA: Diagnosis not present

## 2020-08-21 DIAGNOSIS — I10 Essential (primary) hypertension: Secondary | ICD-10-CM | POA: Diagnosis not present

## 2020-08-21 DIAGNOSIS — E785 Hyperlipidemia, unspecified: Secondary | ICD-10-CM | POA: Diagnosis not present

## 2020-11-05 ENCOUNTER — Encounter: Payer: Self-pay | Admitting: Family Medicine

## 2020-11-05 ENCOUNTER — Ambulatory Visit: Payer: Medicare PPO | Admitting: Family Medicine

## 2020-11-05 ENCOUNTER — Other Ambulatory Visit: Payer: Self-pay

## 2020-11-05 VITALS — BP 126/68 | HR 62 | Temp 98.6°F | Ht 69.0 in | Wt 208.0 lb

## 2020-11-05 DIAGNOSIS — M5137 Other intervertebral disc degeneration, lumbosacral region: Secondary | ICD-10-CM | POA: Diagnosis not present

## 2020-11-05 DIAGNOSIS — Z8639 Personal history of other endocrine, nutritional and metabolic disease: Secondary | ICD-10-CM | POA: Diagnosis not present

## 2020-11-05 DIAGNOSIS — E119 Type 2 diabetes mellitus without complications: Secondary | ICD-10-CM | POA: Diagnosis not present

## 2020-11-05 LAB — POCT GLYCOSYLATED HEMOGLOBIN (HGB A1C): Hemoglobin A1C: 6.1 % — AB (ref 4.0–5.6)

## 2020-11-05 MED ORDER — OXYCODONE-ACETAMINOPHEN 10-325 MG PO TABS: 1.0000 | ORAL_TABLET | Freq: Four times a day (QID) | ORAL | 0 refills | Status: DC | PRN

## 2020-11-05 NOTE — Progress Notes (Signed)
JYT

## 2020-11-05 NOTE — Patient Instructions (Signed)
Get scheduled with cardiology in the meantime.  Plan on a yearly check with [name] in March or April of 2023.  We can do labs at or before the visit.  Thanks for your effort.  Take care.  Glad to see you.

## 2020-11-05 NOTE — Progress Notes (Signed)
This visit occurred during the SARS-CoV-2 public health emergency.  Safety protocols were in place, including screening questions prior to the visit, additional usage of staff PPE, and extensive cleaning of exam room while observing appropriate contact time as indicated for disinfecting solutions.  Diabetes:  No meds.   Hypoglycemic episodes: no sx  Hyperglycemic episodes: no sx Feet problems: no Blood Sugars averaging: not checked.   eye exam within last year: yes A1c clearly improved.   He had intentional weight loss.  He cut back on carbs.    He isn't lightheaded.  Cautions d/w pt about BP with weight loss.    Taking oxycodone prn at baseline for back pain without ADE on med.  More pain at the end of the day after activity and pain with walking.  No new pain but continues at baseline.  Still on meloxicam at baseline.    Meds, vitals, and allergies reviewed.   ROS: Per HPI unless specifically indicated in ROS section   GEN: nad, alert and oriented HEENT: ncat NECK: supple w/o LA CV: rrr. PULM: ctab, no inc wob ABD: soft, +bs EXT: no edema SKIN: well perfused.

## 2020-11-05 NOTE — Assessment & Plan Note (Signed)
Taking oxycodone prn at baseline for back pain without ADE on med.  More pain at the end of the day after activity and pain with walking.  No new pain but continues at baseline.  Still on meloxicam at baseline.  Continue meloxicam and prn percocet.

## 2020-11-05 NOTE — Assessment & Plan Note (Signed)
Resolved with diet and exercise.  D/w pt about A1c.  A1c clearly improved.   He had intentional weight loss.  He cut back on carbs.   He isn't lightheaded.  Cautions d/w pt about BP with weight loss.   No change in meds.  Recheck periodically.

## 2020-12-27 ENCOUNTER — Other Ambulatory Visit: Payer: Self-pay | Admitting: Cardiovascular Disease

## 2020-12-27 DIAGNOSIS — I2510 Atherosclerotic heart disease of native coronary artery without angina pectoris: Secondary | ICD-10-CM

## 2021-01-02 NOTE — Progress Notes (Deleted)
Subjective:   [name]. is a 87 y.o. male who presents for Medicare Annual/Subsequent preventive examination.  I connected with [name] today by telephone and verified that I am speaking with the correct person using two identifiers. Location patient: home Location provider: work Persons participating in the virtual visit: patient, Marine scientist.    I discussed the limitations, risks, security and privacy concerns of performing an evaluation and management service by telephone and the availability of in person appointments. I also discussed with the patient that there may be a patient responsible charge related to this service. The patient expressed understanding and verbally consented to this telephonic visit.    Interactive audio and video telecommunications were attempted between this provider and patient, however failed, due to patient having technical difficulties OR patient did not have access to video capability.  We continued and completed visit with audio only.  Some vital signs may be absent or patient reported.   Time Spent with patient on telephone encounter: *** minutes  Review of Systems           Objective:    There were no vitals filed for this visit. There is no height or weight on file to calculate BMI.  Advanced Directives 08/07/2017 08/05/2016 10/06/2014  Does Patient Have a Medical Advance Directive? Yes Yes Yes  Type of Paramedic of [place];Living will [place];Living will Living will  Copy of [place] in Chart? No - copy requested No - copy requested No - copy requested    Current Medications (verified) Outpatient Encounter Medications as of 01/07/2021  Medication Sig   atorvastatin (LIPITOR) 80 MG tablet Take 1 tablet (80 mg total) by mouth daily.   calcium carbonate (OS-CAL) 600 MG TABS Take 600 mg by mouth daily.   clopidogrel (PLAVIX) 75 MG tablet TAKE 1 TABLET BY MOUTH EVERY DAY    Fish Oil OIL Take 1 capsule by mouth daily.   meloxicam (MOBIC) 7.5 MG tablet Take 7.5 mg by mouth daily.   metoprolol tartrate (LOPRESSOR) 50 MG tablet TAKE 1 TABLET BY MOUTH TWICE A DAY   nitroGLYCERIN (NITROSTAT) 0.4 MG SL tablet Place 1 tablet (0.4 mg total) under the tongue every 5 (five) minutes as needed. For chest pain   oxyCODONE-acetaminophen (PERCOCET) 10-325 MG tablet Take 1 tablet by mouth every 6 (six) hours as needed (sedation caution).   ramipril (ALTACE) 2.5 MG capsule Take 1 capsule (2.5 mg total) by mouth 2 (two) times daily.   No facility-administered encounter medications on file as of 01/07/2021.    Allergies (verified) Doxycycline and Tetracycline   History: Past Medical History:  Diagnosis Date   CAD (coronary artery disease)    PTCA 1998 LAD-s/p overlapping DES in LAD in 05/2003   Chest pain 5/5-5/11/2003   Hospital MI, CAD, INCR, CHOL, HTN   Elevated glucose    History of colonoscopy    HTN (hypertension)    Hyperlipidemia    Impotence    of organic nature   Otitis media    Prostate cancer [facility])    s/p prostatectomy   Spinal disease    lumbosacral   Past Surgical History:  Procedure Laterality Date   ANGIOPLASTY  1992   bone spur right foot     Bilateral   CORONARY ANGIOPLASTY  06/02/2003   PTCA, occl of vessel w/good collaterals   LAMINECTOMY     left shoulder decompression     pilonidal cystectomy  late  20s   POSTERIOR LUMBAR FUSION Bilateral 09/2017   L3-S1 PLIF Dr. Glenna Fellows   PROSTATECTOMY     right carpal tunnel repair  11/18/2007   Dr. Daylene Katayama   Family History  Problem Relation Age of Onset   Prostate cancer Father    Cancer Father        prostate   Colon cancer Mother    Leukemia Mother    Cancer Mother        colon, leukemia   Social History   Socioeconomic History   Marital status: Married    Spouse name: Not on file   Number of children: 3   Years of education: Not on file   Highest education level: Not on file   Occupational History   Occupation: retired-field coord for [employer]  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    2 sons, 1 daughter   6 grandkids   Social Determinants of Radio broadcast assistant Strain: Not on file  Food Insecurity: Not on file  Transportation Needs: Not on file  Physical Activity: Not on file  Stress: Not on file  Social Connections: Not on file    Tobacco Counseling Ready to quit: Not Answered Counseling given: Not Answered   Clinical Intake:                 Diabetic? No         Activities of Daily Living In your present state of health, do you have any difficulty performing the following activities: 07/05/2020  Hearing? N  Vision? N  Difficulty concentrating or making decisions? N  Walking or climbing stairs? N  Dressing or bathing? N  Doing errands, shopping? N  Some recent data might be hidden    Patient Care Team: Tonia Ghent, MD as PCP - General (Family Medicine) Burnell Blanks, MD as PCP - Cardiology (Cardiology) Sharyne Peach, MD as Consulting Physician (Ophthalmology) Vicie Mutters, MD as Consulting Physician (Otolaryngology) Katy Fitch, Darlina Guys, MD as Consulting Physician (Ophthalmology)  Indicate any recent Medical Services you may have received from other than [facility] providers in the past year (date may be approximate).     Assessment:   This is a routine wellness examination for [name].  Hearing/Vision screen No results found.  Dietary issues and exercise activities discussed:     Goals Addressed   None     Depression Screen PHQ 2/9 Scores 07/05/2020 08/26/2018 08/07/2017 08/05/2016 04/03/2015 04/03/2015  PHQ - 2 Score 0 0 0 0 0 0  PHQ- 9 Score 0 - 0 - - -    Fall Risk Fall Risk  07/05/2020 08/26/2018 08/07/2017 08/05/2016 04/03/2015  Falls in the past year? 0 0 No No No  Number falls in past yr: - 0 - - -  Injury with Fall? - 0 - - -    FALL RISK PREVENTION PERTAINING TO THE HOME:  Any stairs in or around the home? {YES/NO:21197} If so, are there any without handrails? {YES/NO:21197} Home free of  loose throw rugs in walkways, pet beds, electrical cords, etc? {YES/NO:21197} Adequate lighting in your home to reduce risk of falls? {YES/NO:21197}  ASSISTIVE DEVICES UTILIZED TO PREVENT FALLS:  Life alert? {YES/NO:21197} Use of a cane, walker or w/c? {YES/NO:21197} Grab bars in the bathroom? {YES/NO:21197} Shower chair or bench in shower? {YES/NO:21197} Elevated toilet seat or a handicapped toilet? {YES/NO:21197}  TIMED UP AND GO:  Was the test performed? No , visit completed over the phone.   Cognitive Function: MMSE - Mini Mental State Exam 08/07/2017 08/05/2016  Orientation to time 5 5  Orientation to Place 5 5  Registration 3 3  Attention/ Calculation 0 0  Recall 3 3  Language- name 2 objects 0 0  Language- repeat 1 1  Language- follow 3 step command 3 3  Language- read & follow direction 0 0  Write a sentence 0 0  Copy design 0 0  Total score 20 20        Immunizations Immunization History  Administered Date(s) Administered   Influenza Split 10/18/2010, 10/20/2017   Influenza Whole 11/13/2005, 10/28/2007, 11/02/2008, 11/21/2009   Influenza, High Dose Seasonal PF 10/20/2018, 10/20/2018, 10/29/2020   Influenza,inj,Quad PF,6+ Mos 10/06/2013, 11/10/2016   Influenza-Unspecified 09/30/2019   PFIZER Comirnaty(Gray Top)Covid-19 Tri-Sucrose Vaccine 05/02/2020   PFIZER(Purple Top)SARS-COV-2 Vaccination 02/17/2019, 03/10/2019, 11/08/2019   Pfizer Covid-19 Vaccine Bivalent Booster  40yrs & up 10/31/2020   Pneumococcal Conjugate-13 04/03/2015   Pneumococcal Polysaccharide-23 12/21/2002   Td 04/28/1998, 05/02/2008   Tdap 10/06/2014   Zoster, Live 01/05/2007    TDAP status: Up to date  Flu Vaccine status: Up to date  Pneumococcal vaccine status: Up to date  Covid-19 vaccine status: Completed vaccines  Qualifies for Shingles Vaccine? Yes   Zostavax completed Yes   {Shingrix Completed?:2101804}  Screening Tests Health Maintenance  Topic Date Due   Zoster Vaccines- Shingrix (1 of 2) Never done   OPHTHALMOLOGY EXAM  04/02/2021   HEMOGLOBIN A1C  05/06/2021   FOOT EXAM  07/05/2021   TETANUS/TDAP  10/05/2024   Pneumonia Vaccine 110+ Years old  Completed   INFLUENZA VACCINE  Completed   COVID-19 Vaccine  Completed   HPV VACCINES  Aged Out    Health Maintenance  Health Maintenance Due  Topic Date Due   Zoster Vaccines- Shingrix (1 of 2) Never done    Colorectal cancer screening: No longer required.   Lung Cancer Screening: (Low Dose CT Chest recommended if Age 17-80 years, 30 pack-year currently smoking OR have quit w/in 15years.) does not qualify.    Additional Screening:  Hepatitis C Screening: does not qualify  Vision Screening: Recommended annual ophthalmology exams for early detection of glaucoma and other disorders of the eye. Is the patient up to date with their annual eye exam?  {YES/NO:21197} Who is the provider or what is the name of the office in which the patient attends annual eye exams? *** If pt is not established with a provider, would they like to be referred to a provider to establish care? {YES/NO:21197}.   Dental Screening: Recommended annual dental exams for proper oral hygiene  Community Resource Referral / Chronic Care Management: CRR required this visit?  {YES/NO:21197}  CCM required this visit?  {YES/NO:21197}     Plan:     I have personally reviewed and noted the following in the patient's chart:   Medical and  social history Use of alcohol, tobacco or illicit drugs  Current medications and supplements including opioid prescriptions. {Opioid [phone]} Functional ability and status Nutritional  status Physical activity Advanced directives List of other physicians Hospitalizations, surgeries, and ER visits in previous 12 months Vitals Screenings to include cognitive, depression, and falls Referrals and appointments  In addition, I have reviewed and discussed with patient certain preventive protocols, quality metrics, and best practice recommendations. A written personalized care plan for preventive services as well as general preventive health recommendations were provided to patient.   Due to this being a telephonic visit, the after visit summary with patients personalized plan was offered to patient via mail or my-chart. ***Patient declined at this time./ Patient would like to access on my-chart/ per request, patient was mailed a copy of AVS./ Patient preferred to pick up at office at next visit.   Loma Messing, LPN   [id number]   Nurse Health Advisor  Nurse Notes: none

## 2021-01-07 ENCOUNTER — Ambulatory Visit: Payer: Medicare PPO

## 2021-03-24 ENCOUNTER — Other Ambulatory Visit: Payer: Self-pay | Admitting: Cardiovascular Disease

## 2021-03-24 DIAGNOSIS — E7849 Other hyperlipidemia: Secondary | ICD-10-CM

## 2021-03-24 DIAGNOSIS — I10 Essential (primary) hypertension: Secondary | ICD-10-CM

## 2021-03-25 NOTE — Progress Notes (Signed)
Chief Complaint  Patient presents with   Follow-up    CAD   History of Present Illness: 88 yo male with history of CAD, HTN, HLD, PVCs here today for cardiac follow up. In 1998 he had PTCA of the LAD. In 2005 he had a drug-eluting stent placed in the LAD and then a short time later had a second drug-eluting stent placed in the LAD overlapping the first for an edge tear. He has moderate carotid artery disease by carotid artery dopplers July 2022. 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 He had Lyme disease in 2018. Echo March 2018 with normal LV systolic and diastolic function, mild MR.   He is here today for follow up. The patient denies any chest pain, dyspnea, palpitations, lower extremity edema, orthopnea, PND, dizziness, near syncope or syncope.    Primary Care Physician: Tonia Ghent, MD  Past Medical History:  Diagnosis Date   CAD (coronary artery disease)    PTCA 1998 LAD-s/p overlapping DES in LAD in 05/2003   Chest pain 5/5-5/11/2003   Hospital MI, CAD, INCR, CHOL, HTN   Elevated glucose    History of colonoscopy    HTN (hypertension)    Hyperlipidemia    Impotence    of organic nature   Otitis media    Prostate cancer [facility])    s/p prostatectomy   Spinal disease    lumbosacral    Past Surgical History:  Procedure Laterality Date   ANGIOPLASTY  1992   bone spur right foot     Bilateral   CORONARY ANGIOPLASTY  06/02/2003   PTCA, occl of vessel w/good collaterals   LAMINECTOMY     left shoulder decompression     pilonidal cystectomy  late 20s   POSTERIOR LUMBAR FUSION Bilateral 09/2017   L3-S1 PLIF Dr. Glenna Fellows   PROSTATECTOMY     right carpal tunnel repair  11/18/2007   Dr. Daylene Katayama    Current Outpatient Medications  Medication Sig Dispense Refill   calcium carbonate (OS-CAL) 600 MG TABS Take 600 mg by mouth daily.     Fish Oil OIL Take 1 capsule by mouth daily.     meloxicam (MOBIC) 7.5  MG tablet Take 7.5 mg by mouth daily.     nitroGLYCERIN (NITROSTAT) 0.4 MG SL tablet Place 1 tablet (0.4 mg total) under the tongue every 5 (five) minutes as needed. For chest pain 25 tablet 6   oxyCODONE-acetaminophen (PERCOCET) 10-325 MG tablet Take 1 tablet by mouth every 6 (six) hours as needed (sedation caution). 60 tablet 0   atorvastatin (LIPITOR) 80 MG tablet Take 1 tablet (80 mg total) by mouth daily. 90 tablet 3   clopidogrel (PLAVIX) 75 MG tablet Take 1 tablet (75 mg total) by mouth daily. 90 tablet 3   metoprolol tartrate (LOPRESSOR) 50 MG tablet Take 1 tablet (50 mg total) by mouth 2 (two) times daily. 180 tablet 3   ramipril (ALTACE) 2.5 MG capsule Take 1 capsule (2.5 mg total) by mouth 2 (two) times daily. 180 capsule 3   No current facility-administered medications for this visit.    Allergies  Allergen Reactions   Doxycycline Rash   Tetracycline Rash    Social History   Socioeconomic History   Marital status: Married    Spouse name: Not on file   Number of children: 3   Years of education: Not on file  Highest education level: Not on file  Occupational History   Occupation: retired-field coord for [employer]  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    2 sons, 1 daughter   6 grandkids   Social Determinants of Radio broadcast assistant Strain: Not on file  Food Insecurity: Not on file  Transportation Needs: Not on file  Physical Activity: Not on file  Stress: Not on file  Social Connections: Not on file  Intimate Partner Violence: Not on file    Family History   Problem Relation Age of Onset   Prostate cancer Father    Cancer Father        prostate   Colon cancer Mother    Leukemia Mother    Cancer Mother        colon, leukemia    Review of Systems:  As stated in the HPI and otherwise negative.   BP 130/70    Pulse 68    Ht 5\' 9"  (1.753 m)    Wt 214 lb 6.4 oz (97.3 kg)    SpO2 98%    BMI 31.66 kg/m   Physical Examination:  General: Well developed, well nourished, NAD  HEENT: OP clear, mucus membranes moist  SKIN: warm, dry. No rashes. Neuro: No focal deficits  Musculoskeletal: Muscle strength 5/5 all ext  Psychiatric: Mood and affect normal  Neck: No JVD, no carotid bruits, no thyromegaly, no lymphadenopathy.  Lungs:Clear bilaterally, no wheezes, rhonci, crackles Cardiovascular: Regular rate and rhythm. No murmurs, gallops or rubs. Abdomen:Soft. Bowel sounds present. Non-tender.  Extremities: No lower extremity edema. Pulses are 2 + in the bilateral DP/PT.  Echo March 2018: - Left ventricle: The cavity size was normal. Systolic function was   normal. The estimated ejection fraction was in the range of 60%   to 65%. Wall motion was normal; there were no regional wall   motion abnormalities. Left ventricular diastolic function   parameters were normal. - Mitral valve: There was mild regurgitation. - Atrial septum: No defect or patent foramen ovale was identified.  EKG:  EKG is ordered today The ekg ordered today demonstrates sinus  Recent Labs: 03/27/2020: ALT 30 06/28/2020: BUN 12; Creatinine, Ser 0.80; Potassium 4.9; Sodium 139   Lipid Panel    Component Value Date/Time   CHOL 126 03/27/2020 0734   TRIG 108 03/27/2020 0734   HDL 40 03/27/2020 0734   CHOLHDL 3.2 03/27/2020 0734   CHOLHDL 3 08/19/2018 0802   VLDL 18.8 08/19/2018 0802   LDLCALC 66 03/27/2020 0734     Wt Readings from Last 3 Encounters:  03/26/21 214 lb 6.4 oz (97.3 kg)  11/05/20 208 lb (94.3 kg)  07/05/20 203 lb (92.1 kg)     Other studies  Reviewed: Additional studies/ records that were reviewed today include: . Review of the above records demonstrates:   Assessment and Plan:   1. CAD without angina: He has no chest pain. LV function normal by echo in 2018. Continue Plavix, beta blocker and statin.  2. HTN: BP is well controlled. No changes  3. HLD: LDL at goal in March 2022. Will continue statin and will repeat lipids and LFTs now.   4. Carotid artery disease: Stable mild carotid disease by dopplers July 2022.  Repeat in July 2024.   5. PVCs: He has no palpitations. Continue beta blocker  Current medicines are reviewed at length with the patient today.  The patient does not have concerns regarding medicines.  The following changes have been made:  no change  Labs/ tests ordered today include:   Orders Placed This Encounter  Procedures   Lipid panel   Hepatic function panel   EKG 12-Lead     Disposition:   F/U with me in 12  months   Signed, Lauree Chandler, MD 03/26/2021 11:56 AM    [facility] [place], [place], [place]  [address] Phone: [phone]; Fax: [phone]

## 2021-03-26 ENCOUNTER — Other Ambulatory Visit: Payer: Self-pay

## 2021-03-26 ENCOUNTER — Encounter: Payer: Self-pay | Admitting: Cardiovascular Disease

## 2021-03-26 ENCOUNTER — Ambulatory Visit: Payer: Medicare PPO | Admitting: Cardiovascular Disease

## 2021-03-26 VITALS — BP 130/70 | HR 68 | Ht 69.0 in | Wt 214.4 lb

## 2021-03-26 DIAGNOSIS — I493 Ventricular premature depolarization: Secondary | ICD-10-CM | POA: Diagnosis not present

## 2021-03-26 DIAGNOSIS — I6523 Occlusion and stenosis of bilateral carotid arteries: Secondary | ICD-10-CM

## 2021-03-26 DIAGNOSIS — E7849 Other hyperlipidemia: Secondary | ICD-10-CM

## 2021-03-26 DIAGNOSIS — I2510 Atherosclerotic heart disease of native coronary artery without angina pectoris: Secondary | ICD-10-CM

## 2021-03-26 DIAGNOSIS — I10 Essential (primary) hypertension: Secondary | ICD-10-CM | POA: Diagnosis not present

## 2021-03-26 LAB — LIPID PANEL
Chol/HDL Ratio: 3.3 ratio (ref 0.0–5.0)
Cholesterol, Total: 147 mg/dL (ref 100–199)
HDL: 45 mg/dL (ref >39)
LDL Chol Calc (NIH): 79 mg/dL (ref 0–99)
Triglycerides: 128 mg/dL (ref 0–149)
VLDL Cholesterol Cal: 23 mg/dL (ref 5–40)

## 2021-03-26 LAB — HEPATIC FUNCTION PANEL
ALT: 36 IU/L (ref 0–44)
AST: 19 IU/L (ref 0–40)
Albumin: 4.6 g/dL (ref 3.6–4.6)
Alkaline Phosphatase: 111 IU/L (ref 44–121)
Bilirubin Total: 0.8 mg/dL (ref 0.0–1.2)
Bilirubin, Direct: 0.22 mg/dL (ref 0.00–0.40)
Total Protein: 6.9 g/dL (ref 6.0–8.5)

## 2021-03-26 MED ORDER — RAMIPRIL 2.5 MG PO CAPS: 2.5000 mg | ORAL_CAPSULE | Freq: Two times a day (BID) | ORAL | 3 refills | Status: DC

## 2021-03-26 MED ORDER — CLOPIDOGREL BISULFATE 75 MG PO TABS: 75.0000 mg | ORAL_TABLET | Freq: Every day | ORAL | 3 refills | Status: DC

## 2021-03-26 MED ORDER — METOPROLOL TARTRATE 50 MG PO TABS: 50.0000 mg | ORAL_TABLET | Freq: Two times a day (BID) | ORAL | 3 refills | Status: DC

## 2021-03-26 MED ORDER — ATORVASTATIN CALCIUM 80 MG PO TABS: 80.0000 mg | ORAL_TABLET | Freq: Every day | ORAL | 3 refills | Status: DC

## 2021-03-26 NOTE — Patient Instructions (Signed)
Medication Instructions:  No changes *If you need a refill on your cardiac medications before your next appointment, please call your pharmacy*   Lab Work: Today: lipids/liver function  If you have labs (blood work) drawn today and your tests are completely normal, you will receive your results only by: [place] (if you have MyChart) OR A paper copy in the mail If you have any lab test that is abnormal or we need to change your treatment, we will call you to review the results.   Testing/Procedures: No changes  Follow-Up: At [facility], you and your health needs are our priority.  As part of our continuing mission to provide you with exceptional heart care, we have created designated Provider Care Teams.  These Care Teams include your primary Cardiologist (physician) and Advanced Practice Providers (APPs -  Physician Assistants and Nurse Practitioners) who all work together to provide you with the care you need, when you need it.  We recommend signing up for the patient portal called "MyChart".  Sign up information is provided on this After Visit Summary.  MyChart is used to connect with patients for Virtual Visits (Telemedicine).  Patients are able to view lab/test results, encounter notes, upcoming appointments, etc.  Non-urgent messages can be sent to your provider as well.   To learn more about what you can do with MyChart, go to [email or web address].    Your next appointment:   12 month(s)  The format for your next appointment:   In Person  Provider:   Lauree Chandler, MD

## 2021-04-04 DIAGNOSIS — H0288A Meibomian gland dysfunction right eye, upper and lower eyelids: Secondary | ICD-10-CM | POA: Diagnosis not present

## 2021-04-04 DIAGNOSIS — H0288B Meibomian gland dysfunction left eye, upper and lower eyelids: Secondary | ICD-10-CM | POA: Diagnosis not present

## 2021-04-04 DIAGNOSIS — H43392 Other vitreous opacities, left eye: Secondary | ICD-10-CM | POA: Diagnosis not present

## 2021-04-04 DIAGNOSIS — H04123 Dry eye syndrome of bilateral lacrimal glands: Secondary | ICD-10-CM | POA: Diagnosis not present

## 2021-04-04 DIAGNOSIS — H2512 Age-related nuclear cataract, left eye: Secondary | ICD-10-CM | POA: Diagnosis not present

## 2021-05-28 DIAGNOSIS — H25812 Combined forms of age-related cataract, left eye: Secondary | ICD-10-CM | POA: Diagnosis not present

## 2021-06-05 DIAGNOSIS — H2511 Age-related nuclear cataract, right eye: Secondary | ICD-10-CM | POA: Diagnosis not present

## 2021-06-11 DIAGNOSIS — H25811 Combined forms of age-related cataract, right eye: Secondary | ICD-10-CM | POA: Diagnosis not present

## 2021-07-23 DIAGNOSIS — R29898 Other symptoms and signs involving the musculoskeletal system: Secondary | ICD-10-CM | POA: Insufficient documentation

## 2021-07-23 DIAGNOSIS — M4326 Fusion of spine, lumbar region: Secondary | ICD-10-CM | POA: Diagnosis not present

## 2021-07-23 DIAGNOSIS — M461 Sacroiliitis, not elsewhere classified: Secondary | ICD-10-CM | POA: Diagnosis not present

## 2021-07-23 DIAGNOSIS — M7918 Myalgia, other site: Secondary | ICD-10-CM | POA: Diagnosis not present

## 2021-08-19 ENCOUNTER — Ambulatory Visit (INDEPENDENT_AMBULATORY_CARE_PROVIDER_SITE_OTHER): Payer: Medicare PPO

## 2021-08-19 VITALS — Wt 214.0 lb

## 2021-08-19 DIAGNOSIS — Z0000 Encounter for general adult medical examination without abnormal findings: Secondary | ICD-10-CM | POA: Diagnosis not present

## 2021-08-19 NOTE — Progress Notes (Signed)
Virtual Visit via Telephone Note  I connected with  [name]. on 08/19/21 at  2:30 PM EDT by telephone and verified that I am speaking with the correct person using two identifiers.  Location: Patient: home Provider: [place] Persons participating in the virtual visit: [place]   I discussed the limitations, risks, security and privacy concerns of performing an evaluation and management service by telephone and the availability of in person appointments. The patient expressed understanding and agreed to proceed.  Interactive audio and video telecommunications were attempted between this nurse and patient, however failed, due to patient having technical difficulties OR patient did not have access to video capability.  We continued and completed visit with audio only.  Some vital signs may be absent or patient reported.   Dionisio David, LPN  Subjective:   Noe Goyer. is a 88 y.o. male who presents for Medicare Annual/Subsequent preventive examination.  Review of Systems           Objective:    There were no vitals filed for this visit. There is no height or weight on file to calculate BMI.     08/07/2017    9:22 AM 08/05/2016    9:50 AM 10/06/2014    9:03 AM  Advanced Directives  Does Patient Have a Medical Advance Directive? Yes Yes Yes  Type of Paramedic of [place];Living will [employer];Living will Living will  Copy of [place] in Chart? No - copy requested No - copy requested No - copy requested    Current Medications (verified) Outpatient Encounter Medications as of 08/19/2021  Medication Sig   atorvastatin (LIPITOR) 80 MG tablet Take 1 tablet (80 mg total) by mouth daily.   calcium carbonate (OS-CAL) 600 MG TABS Take 600 mg by mouth daily.   calcium carbonate (OSCAL) 1500 (600 Ca) MG TABS tablet Take by mouth.   clopidogrel (PLAVIX) 75 MG tablet Take 1 tablet (75 mg  total) by mouth daily.   Fish Oil OIL Take 1 capsule by mouth daily.   lidocaine (XYLOCAINE) 1 % (with preservative) injection by Infiltration route.   meloxicam (MOBIC) 15 MG tablet Take by mouth.   meloxicam (MOBIC) 7.5 MG tablet Take 7.5 mg by mouth daily.   metoprolol tartrate (LOPRESSOR) 50 MG tablet Take 1 tablet (50 mg total) by mouth 2 (two) times daily.   nitroGLYCERIN (NITROSTAT) 0.4 MG SL tablet Place 1 tablet (0.4 mg total) under the tongue every 5 (five) minutes as needed. For chest pain   oxyCODONE-acetaminophen (PERCOCET) 10-325 MG tablet Take 1 tablet by mouth every 6 (six) hours as needed (sedation caution).   ramipril (ALTACE) 2.5 MG capsule Take 1 capsule (2.5 mg total) by mouth 2 (two) times daily.   triamcinolone acetonide (KENALOG-40) 40 MG/ML injection Inject into the articular space.   No facility-administered encounter medications on file as of 08/19/2021.    Allergies (verified) Doxycycline and Tetracycline   History: Past Medical History:  Diagnosis Date   CAD (coronary artery disease)    PTCA 1998 LAD-s/p overlapping DES in LAD in 05/2003   Chest pain 5/5-5/11/2003   Hospital MI, CAD, INCR, CHOL, HTN   Elevated glucose    History of colonoscopy    HTN (hypertension)    Hyperlipidemia    Impotence    of organic nature   Otitis media    Prostate cancer [facility])    s/p prostatectomy   Spinal  disease    lumbosacral   Past Surgical History:  Procedure Laterality Date   ANGIOPLASTY  1992   bone spur right foot     Bilateral   CORONARY ANGIOPLASTY  06/02/2003   PTCA, occl of vessel w/good collaterals   LAMINECTOMY     left shoulder decompression     pilonidal cystectomy  late 20s   POSTERIOR LUMBAR FUSION Bilateral 09/2017   L3-S1 PLIF Dr. Glenna Fellows   PROSTATECTOMY     right carpal tunnel repair  11/18/2007   Dr. 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    2 sons, 1 daughter   92 grandkids   Social Determinants of Radio broadcast assistant Strain: Not on file  Food Insecurity: Not on file  Transportation Needs: Not on file  Physical Activity: Inactive (08/19/2021)   Exercise Vital Sign    Days of Exercise per Week: 0 days    Minutes of Exercise per Session: 0 min  Stress: No Stress Concern Present (08/19/2021)   [place]    Feeling of Stress : Not at all  Social Connections: Not on file    Tobacco Counseling Ready to quit: Not Answered Counseling given: Not Answered   Clinical Intake:  Pre-visit preparation completed: Yes  Pain : No/denies pain     Nutritional Risks: None Diabetes: Yes CBG done?: No Did pt. bring in CBG monitor from home?: No  How often do you need to have someone help you when you read instructions, pamphlets, or other written materials from your doctor or pharmacy?: 1 - Never  Diabetic?yes Nutrition Risk Assessment:  Has the patient had any N/V/D within the last 2 months?  No  Does the patient have any non-healing wounds?  No  Has the patient had any unintentional weight  loss or weight gain?  No   Diabetes:  Is the patient diabetic?  Yes  If diabetic, was a CBG obtained today?  No  Did the patient bring in their glucometer from home?  No  How often do you monitor your CBG's? never.   Financial Strains and Diabetes Management:  Are you having any financial strains with the device, your supplies or your medication? No .  Does the patient want to be seen by Chronic Care Management for management of their diabetes?  No  Would the  patient like to be referred to a Nutritionist or for Diabetic Management?  No   Diabetic Exams:  Diabetic Eye Exam: Completed 04/02/20. Overdue for diabetic eye exam. Pt has been advised about the importance in completing this exam.  Diabetic Foot Exam: Completed 07/05/20. Pt has been advised about the importance in completing this exam.   Interpreter Needed?: No  Information entered by :: Kirke Shaggy, LPN   Activities of Daily Living     No data to display          Patient Care Team: Tonia Ghent, MD as PCP - General (Family Medicine) Burnell Blanks, MD as PCP - Cardiology (Cardiology) Sharyne Peach, MD as Consulting Physician (Ophthalmology) Vicie Mutters, MD as Consulting Physician (Otolaryngology) Katy Fitch, Darlina Guys, MD as Consulting Physician (Ophthalmology)  Indicate any recent Medical Services you may have received from other than [facility] providers in the past year (date may be approximate).     Assessment:   This is a routine wellness examination for Taysen.  Hearing/Vision screen No results found.  Dietary issues and exercise activities discussed:     Goals Addressed             This Visit's Progress    DIET - EAT MORE FRUITS AND VEGETABLES         Depression Screen    08/19/2021    2:34 PM 07/05/2020   10:47 AM 08/26/2018   10:18 AM 08/07/2017    9:22 AM 08/05/2016    9:50 AM 04/03/2015    9:30 AM 04/03/2015    9:27 AM  PHQ 2/9 Scores  PHQ - 2 Score 0 0 0 0 0 0 0  PHQ- 9 Score 0  0  0     Exception Documentation Patient refusal          Fall Risk    07/05/2020   10:46 AM 08/26/2018   10:18 AM 08/07/2017    9:22 AM 08/05/2016    9:50 AM 04/03/2015    9:30 AM  Fall Risk   Falls in the past year? 0 0 No No No  Number falls in past yr:  0     Injury with Fall?  0       FALL RISK PREVENTION PERTAINING TO THE HOME:  Any stairs in or around the home? No  If so, are there any without handrails? No  Home free of loose throw rugs in walkways, pet beds, electrical cords, etc? Yes  Adequate lighting in your home to reduce risk of falls? Yes   ASSISTIVE DEVICES UTILIZED TO PREVENT FALLS:  Life alert? No  Use of a cane, walker or w/c? No  Grab bars in the bathroom? No  Shower chair or bench in shower? No  Elevated toilet seat or a handicapped toilet? No    Cognitive Function:declined      08/07/2017    9:34 AM 08/05/2016    9:50 AM  MMSE - Mini Mental State Exam  Orientation to time 5 5  Orientation to Place 5 5  Registration 3 3  Attention/ Calculation 0 0  Recall 3 3  Language- name 2 objects 0 0  Language- repeat 1 1  Language- follow 3 step command 3 3  Language- read & follow direction 0 0  Write a sentence 0 0  Copy design 0 0  Total score 20 20        Immunizations Immunization History  Administered Date(s) Administered   Influenza Split 10/18/2010, 10/20/2017  Influenza Whole 11/13/2005, 10/28/2007, 11/02/2008, 11/21/2009   Influenza, High Dose Seasonal PF 10/20/2018, 10/20/2018, 10/29/2020   Influenza,inj,Quad PF,6+ Mos 10/06/2013, 11/10/2016   Influenza-Unspecified 09/30/2019   PFIZER Comirnaty(Gray Top)Covid-19 Tri-Sucrose Vaccine 05/02/2020   PFIZER(Purple Top)SARS-COV-2 Vaccination 02/17/2019, 03/10/2019, 11/08/2019   Pfizer Covid-19 Vaccine Bivalent Booster 63yr & up 10/31/2020   Pneumococcal Conjugate-13 04/03/2015   Pneumococcal Polysaccharide-23 12/21/2002   Td 04/28/1998, 05/02/2008   Tdap 10/06/2014   Zoster, Live  01/05/2007    TDAP status: Up to date  Flu Vaccine status: Up to date  Pneumococcal vaccine status: Up to date  Covid-19 vaccine status: Completed vaccines  Qualifies for Shingles Vaccine? Yes   Zostavax completed Yes   Shingrix Completed?: No.    Education has been provided regarding the importance of this vaccine. Patient has been advised to call insurance company to determine out of pocket expense if they have not yet received this vaccine. Advised may also receive vaccine at local pharmacy or Health Dept. Verbalized acceptance and understanding.  Screening Tests Health Maintenance  Topic Date Due   Zoster Vaccines- Shingrix (1 of 2) Never done   OPHTHALMOLOGY EXAM  04/02/2021   HEMOGLOBIN A1C  05/06/2021   FOOT EXAM  07/05/2021   INFLUENZA VACCINE  08/27/2021   TETANUS/TDAP  10/05/2024   Pneumonia Vaccine 88 Years old  Completed   COVID-19 Vaccine  Completed   HPV VACCINES  Aged Out    Health Maintenance  Health Maintenance Due  Topic Date Due   Zoster Vaccines- Shingrix (1 of 2) Never done   OPHTHALMOLOGY EXAM  04/02/2021   HEMOGLOBIN A1C  05/06/2021   FOOT EXAM  07/05/2021    Colorectal cancer screening: No longer required.   Lung Cancer Screening: (Low Dose CT Chest recommended if Age 86-80years, 30 pack-year currently smoking OR have quit w/in 15years.) does not qualify.    Additional Screening:  Hepatitis C Screening: does not qualify; Completed no  Vision Screening: Recommended annual ophthalmology exams for early detection of glaucoma and other disorders of the eye. Is the patient up to date with their annual eye exam?  Yes  Who is the provider or what is the name of the office in which the patient attends annual eye exams? Dr.Groat If pt is not established with a provider, would they like to be referred to a provider to establish care? No .   Dental Screening: Recommended annual dental exams for proper oral hygiene  Community Resource Referral /  Chronic Care Management: CRR required this visit?  No   CCM required this visit?  No      Plan:     I have personally reviewed and noted the following in the patient's chart:   Medical and social history Use of alcohol, tobacco or illicit drugs  Current medications and supplements including opioid prescriptions. Patient is not currently taking opioid prescriptions. Functional ability and status Nutritional status Physical activity Advanced directives List of other physicians Hospitalizations, surgeries, and ER visits in previous 12 months Vitals Screenings to include cognitive, depression, and falls Referrals and appointments  In addition, I have reviewed and discussed with patient certain preventive protocols, quality metrics, and best practice recommendations. A written personalized care plan for preventive services as well as general preventive health recommendations were provided to patient.     LDionisio David LPN   [id number]  Nurse Notes: none

## 2021-08-19 NOTE — Patient Instructions (Signed)
[name] , Thank you for taking time to come for your Medicare Wellness Visit. I appreciate your ongoing commitment to your health goals. Please review the following plan we discussed and let me know if I can assist you in the future.   Screening recommendations/referrals: Colonoscopy: aged out Recommended yearly ophthalmology/optometry visit for glaucoma screening and checkup Recommended yearly dental visit for hygiene and checkup  Vaccinations: Influenza vaccine: 10/29/20 Pneumococcal vaccine: 04/03/15 Tdap vaccine: 10/06/14 Shingles vaccine: Zostavax 01/05/07   Covid-19: 02/17/19, 03/10/19, 11/08/19, 05/02/20, 10/31/20  Advanced directives: no  Conditions/risks identified: none  Next appointment: Follow up in one year for your annual wellness visit. 08/21/22 @ 8:45 am by phone  Preventive Care 65 Years and Older, Male Preventive care refers to lifestyle choices and visits with your health care provider that can promote health and wellness. What does preventive care include? A yearly physical exam. This is also called an annual well check. Dental exams once or twice a year. Routine eye exams. Ask your health care provider how often you should have your eyes checked. Personal lifestyle choices, including: Daily care of your teeth and gums. Regular physical activity. Eating a healthy diet. Avoiding tobacco and drug use. Limiting alcohol use. Practicing safe sex. Taking low doses of aspirin every day. Taking vitamin and mineral supplements as recommended by your health care provider. What happens during an annual well check? The services and screenings done by your health care provider during your annual well check will depend on your age, overall health, lifestyle risk factors, and family history of disease. Counseling  Your health care provider may ask you questions about your: Alcohol use. Tobacco use. Drug use. Emotional well-being. Home and relationship well-being. Sexual  activity. Eating habits. History of falls. Memory and ability to understand (cognition). Work and work Statistician. Screening  You may have the following tests or measurements: Height, weight, and BMI. Blood pressure. Lipid and cholesterol levels. These may be checked every 5 years, or more frequently if you are over 85 years old. Skin check. Lung cancer screening. You may have this screening every year starting at age 25 if you have a 30-pack-year history of smoking and currently smoke or have quit within the past 15 years. Fecal occult blood test (FOBT) of the stool. You may have this test every year starting at age 66. Flexible sigmoidoscopy or colonoscopy. You may have a sigmoidoscopy every 5 years or a colonoscopy every 10 years starting at age 12. Prostate cancer screening. Recommendations will vary depending on your family history and other risks. Hepatitis C blood test. Hepatitis B blood test. Sexually transmitted disease (STD) testing. Diabetes screening. This is done by checking your blood sugar (glucose) after you have not eaten for a while (fasting). You may have this done every 1-3 years. Abdominal aortic aneurysm (AAA) screening. You may need this if you are a current or former smoker. Osteoporosis. You may be screened starting at age 48 if you are at high risk. Talk with your health care provider about your test results, treatment options, and if necessary, the need for more tests. Vaccines  Your health care provider may recommend certain vaccines, such as: Influenza vaccine. This is recommended every year. Tetanus, diphtheria, and acellular pertussis (Tdap, Td) vaccine. You may need a Td booster every 10 years. Zoster vaccine. You may need this after age 33. Pneumococcal 13-valent conjugate (PCV13) vaccine. One dose is recommended after age 45. Pneumococcal polysaccharide (PPSV23) vaccine. One dose is recommended after age 55. Talk  to your health care provider about which  screenings and vaccines you need and how often you need them. This information is not intended to replace advice given to you by your health care provider. Make sure you discuss any questions you have with your health care provider. Document Released: 02/09/2015 Document Revised: 10/03/2015 Document Reviewed: 11/14/2014 Elsevier Interactive Patient Education  2017 [place] Prevention in the Home Falls can cause injuries. They can happen to people of all ages. There are many things you can do to make your home safe and to help prevent falls. What can I do on the outside of my home? Regularly fix the edges of walkways and driveways and fix any cracks. Remove anything that might make you trip as you walk through a door, such as a raised step or threshold. Trim any bushes or trees on the path to your home. Use bright outdoor lighting. Clear any walking paths of anything that might make someone trip, such as rocks or tools. Regularly check to see if handrails are loose or broken. Make sure that both sides of any steps have handrails. Any raised decks and porches should have guardrails on the edges. Have any leaves, snow, or ice cleared regularly. Use sand or salt on walking paths during winter. Clean up any spills in your garage right away. This includes oil or grease spills. What can I do in the bathroom? Use night lights. Install grab bars by the toilet and in the tub and shower. Do not use towel bars as grab bars. Use non-skid mats or decals in the tub or shower. If you need to sit down in the shower, use a plastic, non-slip stool. Keep the floor dry. Clean up any water that spills on the floor as soon as it happens. Remove soap buildup in the tub or shower regularly. Attach bath mats securely with double-sided non-slip rug tape. Do not have throw rugs and other things on the floor that can make you trip. What can I do in the bedroom? Use night lights. Make sure that you have a  light by your bed that is easy to reach. Do not use any sheets or blankets that are too big for your bed. They should not hang down onto the floor. Have a firm chair that has side arms. You can use this for support while you get dressed. Do not have throw rugs and other things on the floor that can make you trip. What can I do in the kitchen? Clean up any spills right away. Avoid walking on wet floors. Keep items that you use a lot in easy-to-reach places. If you need to reach something above you, use a strong step stool that has a grab bar. Keep electrical cords out of the way. Do not use floor polish or wax that makes floors slippery. If you must use wax, use non-skid floor wax. Do not have throw rugs and other things on the floor that can make you trip. What can I do with my stairs? Do not leave any items on the stairs. Make sure that there are handrails on both sides of the stairs and use them. Fix handrails that are broken or loose. Make sure that handrails are as long as the stairways. Check any carpeting to make sure that it is firmly attached to the stairs. Fix any carpet that is loose or worn. Avoid having throw rugs at the top or bottom of the stairs. If you do have throw rugs,  attach them to the floor with carpet tape. Make sure that you have a light switch at the top of the stairs and the bottom of the stairs. If you do not have them, ask someone to add them for you. What else can I do to help prevent falls? Wear shoes that: Do not have high heels. Have rubber bottoms. Are comfortable and fit you well. Are closed at the toe. Do not wear sandals. If you use a stepladder: Make sure that it is fully opened. Do not climb a closed stepladder. Make sure that both sides of the stepladder are locked into place. Ask someone to hold it for you, if possible. Clearly mark and make sure that you can see: Any grab bars or handrails. First and last steps. Where the edge of each step  is. Use tools that help you move around (mobility aids) if they are needed. These include: Canes. Walkers. Scooters. Crutches. Turn on the lights when you go into a dark area. Replace any light bulbs as soon as they burn out. Set up your furniture so you have a clear path. Avoid moving your furniture around. If any of your floors are uneven, fix them. If there are any pets around you, be aware of where they are. Review your medicines with your doctor. Some medicines can make you feel dizzy. This can increase your chance of falling. Ask your doctor what other things that you can do to help prevent falls. This information is not intended to replace advice given to you by your health care provider. Make sure you discuss any questions you have with your health care provider. Document Released: 11/09/2008 Document Revised: 06/21/2015 Document Reviewed: 02/17/2014 Elsevier Interactive Patient Education  2017 [employer].

## 2021-08-20 ENCOUNTER — Ambulatory Visit (HOSPITAL_COMMUNITY)
Admission: RE | Admit: 2021-08-20 | Discharge: 2021-08-20 | Disposition: A | Discharge status: Home / Self Care | Payer: Medicare PPO | Source: Ambulatory Visit | Attending: Cardiovascular Disease | Admitting: Cardiovascular Disease

## 2021-08-20 DIAGNOSIS — I6523 Occlusion and stenosis of bilateral carotid arteries: Secondary | ICD-10-CM

## 2021-11-25 DIAGNOSIS — M545 Low back pain, unspecified: Secondary | ICD-10-CM | POA: Diagnosis not present

## 2021-11-25 DIAGNOSIS — M25551 Pain in right hip: Secondary | ICD-10-CM | POA: Diagnosis not present

## 2021-11-29 DIAGNOSIS — M545 Low back pain, unspecified: Secondary | ICD-10-CM | POA: Diagnosis not present

## 2021-11-29 DIAGNOSIS — G8929 Other chronic pain: Secondary | ICD-10-CM | POA: Diagnosis not present

## 2021-11-29 DIAGNOSIS — M625A2 Muscle wasting and atrophy, not elsewhere classified, back, lumbosacral: Secondary | ICD-10-CM | POA: Diagnosis not present

## 2021-12-27 DIAGNOSIS — M545 Low back pain, unspecified: Secondary | ICD-10-CM | POA: Diagnosis not present

## 2021-12-27 DIAGNOSIS — G8929 Other chronic pain: Secondary | ICD-10-CM | POA: Diagnosis not present

## 2021-12-27 DIAGNOSIS — M625A2 Muscle wasting and atrophy, not elsewhere classified, back, lumbosacral: Secondary | ICD-10-CM | POA: Diagnosis not present

## 2021-12-27 DIAGNOSIS — M48061 Spinal stenosis, lumbar region without neurogenic claudication: Secondary | ICD-10-CM | POA: Diagnosis not present

## 2021-12-27 DIAGNOSIS — M4807 Spinal stenosis, lumbosacral region: Secondary | ICD-10-CM | POA: Diagnosis not present

## 2021-12-27 DIAGNOSIS — Z981 Arthrodesis status: Secondary | ICD-10-CM | POA: Diagnosis not present

## 2022-01-03 DIAGNOSIS — M625A2 Muscle wasting and atrophy, not elsewhere classified, back, lumbosacral: Secondary | ICD-10-CM | POA: Diagnosis not present

## 2022-01-03 DIAGNOSIS — M545 Low back pain, unspecified: Secondary | ICD-10-CM | POA: Diagnosis not present

## 2022-01-03 DIAGNOSIS — G8929 Other chronic pain: Secondary | ICD-10-CM | POA: Diagnosis not present

## 2022-01-06 DIAGNOSIS — H0288B Meibomian gland dysfunction left eye, upper and lower eyelids: Secondary | ICD-10-CM | POA: Diagnosis not present

## 2022-01-06 DIAGNOSIS — H0288A Meibomian gland dysfunction right eye, upper and lower eyelids: Secondary | ICD-10-CM | POA: Diagnosis not present

## 2022-01-06 DIAGNOSIS — H04123 Dry eye syndrome of bilateral lacrimal glands: Secondary | ICD-10-CM | POA: Diagnosis not present

## 2022-01-06 DIAGNOSIS — Z961 Presence of intraocular lens: Secondary | ICD-10-CM | POA: Diagnosis not present

## 2022-01-21 DIAGNOSIS — S40811A Abrasion of right upper arm, initial encounter: Secondary | ICD-10-CM | POA: Diagnosis not present

## 2022-02-14 DIAGNOSIS — Z01818 Encounter for other preprocedural examination: Secondary | ICD-10-CM | POA: Diagnosis not present

## 2022-02-14 DIAGNOSIS — G8929 Other chronic pain: Secondary | ICD-10-CM | POA: Diagnosis not present

## 2022-02-14 DIAGNOSIS — I2510 Atherosclerotic heart disease of native coronary artery without angina pectoris: Secondary | ICD-10-CM | POA: Diagnosis not present

## 2022-02-14 DIAGNOSIS — M545 Low back pain, unspecified: Secondary | ICD-10-CM | POA: Diagnosis not present

## 2022-02-14 DIAGNOSIS — I10 Essential (primary) hypertension: Secondary | ICD-10-CM | POA: Diagnosis not present

## 2022-02-14 DIAGNOSIS — Z6830 Body mass index (BMI) 30.0-30.9, adult: Secondary | ICD-10-CM | POA: Diagnosis not present

## 2022-03-21 ENCOUNTER — Encounter: Payer: Self-pay | Admitting: Cardiovascular Disease

## 2022-03-21 ENCOUNTER — Ambulatory Visit: Payer: Medicare PPO | Attending: Cardiovascular Disease | Admitting: Cardiovascular Disease

## 2022-03-21 VITALS — BP 130/70 | HR 66 | Ht 69.0 in | Wt 213.2 lb

## 2022-03-21 DIAGNOSIS — I2510 Atherosclerotic heart disease of native coronary artery without angina pectoris: Secondary | ICD-10-CM | POA: Diagnosis not present

## 2022-03-21 DIAGNOSIS — I10 Essential (primary) hypertension: Secondary | ICD-10-CM

## 2022-03-21 DIAGNOSIS — E7849 Other hyperlipidemia: Secondary | ICD-10-CM

## 2022-03-21 DIAGNOSIS — I6523 Occlusion and stenosis of bilateral carotid arteries: Secondary | ICD-10-CM | POA: Diagnosis not present

## 2022-03-21 DIAGNOSIS — I493 Ventricular premature depolarization: Secondary | ICD-10-CM | POA: Diagnosis not present

## 2022-03-21 MED ORDER — RAMIPRIL 2.5 MG PO CAPS: 2.5000 mg | ORAL_CAPSULE | Freq: Two times a day (BID) | ORAL | 3 refills | Status: DC

## 2022-03-21 MED ORDER — METOPROLOL TARTRATE 50 MG PO TABS: 50.0000 mg | ORAL_TABLET | Freq: Two times a day (BID) | ORAL | 3 refills | Status: DC

## 2022-03-21 MED ORDER — CLOPIDOGREL BISULFATE 75 MG PO TABS: 75.0000 mg | ORAL_TABLET | Freq: Every day | ORAL | 3 refills | Status: DC

## 2022-03-21 MED ORDER — ATORVASTATIN CALCIUM 80 MG PO TABS: 80.0000 mg | ORAL_TABLET | Freq: Every day | ORAL | 3 refills | Status: DC

## 2022-03-21 NOTE — Patient Instructions (Signed)
Medication Instructions:  No changes *If you need a refill on your cardiac medications before your next appointment, please call your pharmacy*   Lab Work: Please return in about 2 months for fasting lab work (lipids and liver function)   Testing/Procedures: none   Follow-Up: At [facility], you and your health needs are our priority.  As part of our continuing mission to provide you with exceptional heart care, we have created designated Provider Care Teams.  These Care Teams include your primary Cardiologist (physician) and Advanced Practice Providers (APPs -  Physician Assistants and Nurse Practitioners) who all work together to provide you with the care you need, when you need it.  We recommend signing up for the patient portal called "MyChart".  Sign up information is provided on this After Visit Summary.  MyChart is used to connect with patients for Virtual Visits (Telemedicine).  Patients are able to view lab/test results, encounter notes, upcoming appointments, etc.  Non-urgent messages can be sent to your provider as well.   To learn more about what you can do with MyChart, go to [email or web address].    Your next appointment:   12 month(s)  Provider:   Lauree Chandler, MD

## 2022-03-21 NOTE — Addendum Note (Signed)
Addended by: Rodman Key on: 03/21/2022 10:01 AM   Modules accepted: Orders

## 2022-03-21 NOTE — Progress Notes (Signed)
Chief Complaint  Patient presents with   Follow-up    CAD   History of Present Illness: 89 yo male with history of CAD, HTN, HLD, PVCs here today for cardiac follow up. In 1998 he had PTCA of the LAD. In 2005 he had overlapping drug-eluting stents placed in the LAD. He has mild carotid artery disease by carotid artery dopplers July 2023. 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 He had Lyme disease in 2018. Echo March 2018 with normal LV systolic and diastolic function, mild MR.   He is here today for follow up. The patient denies any chest pain, dyspnea, palpitations, lower extremity edema, orthopnea, PND, dizziness, near syncope or syncope.    Primary Care Physician: Tonia Ghent, MD  Past Medical History:  Diagnosis Date   CAD (coronary artery disease)    PTCA 1998 LAD-s/p overlapping DES in LAD in 05/2003   Chest pain 5/5-5/11/2003   Hospital MI, CAD, INCR, CHOL, HTN   Elevated glucose    History of colonoscopy    HTN (hypertension)    Hyperlipidemia    Impotence    of organic nature   Otitis media    Prostate cancer [facility])    s/p prostatectomy   Spinal disease    lumbosacral    Past Surgical History:  Procedure Laterality Date   ANGIOPLASTY  1992   bone spur right foot     Bilateral   CORONARY ANGIOPLASTY  06/02/2003   PTCA, occl of vessel w/good collaterals   LAMINECTOMY     left shoulder decompression     pilonidal cystectomy  late 20s   POSTERIOR LUMBAR FUSION Bilateral 09/2017   L3-S1 PLIF Dr. Glenna Fellows   PROSTATECTOMY     right carpal tunnel repair  11/18/2007   Dr. Daylene Katayama    Current Outpatient Medications  Medication Sig Dispense Refill   calcium carbonate (OS-CAL) 600 MG TABS Take 600 mg by mouth daily.     Fish Oil OIL Take 1 capsule by mouth daily.     lidocaine (XYLOCAINE) 1 % (with preservative) injection by Infiltration route.     meloxicam (MOBIC) 7.5 MG tablet Take 7.5 mg by  mouth daily.     nitroGLYCERIN (NITROSTAT) 0.4 MG SL tablet Place 1 tablet (0.4 mg total) under the tongue every 5 (five) minutes as needed. For chest pain 25 tablet 6   oxyCODONE-acetaminophen (PERCOCET) 10-325 MG tablet Take 1 tablet by mouth every 6 (six) hours as needed (sedation caution). 60 tablet 0   atorvastatin (LIPITOR) 80 MG tablet Take 1 tablet (80 mg total) by mouth daily. 90 tablet 3   clopidogrel (PLAVIX) 75 MG tablet Take 1 tablet (75 mg total) by mouth daily. 90 tablet 3   metoprolol tartrate (LOPRESSOR) 50 MG tablet Take 1 tablet (50 mg total) by mouth 2 (two) times daily. 180 tablet 3   ramipril (ALTACE) 2.5 MG capsule Take 1 capsule (2.5 mg total) by mouth 2 (two) times daily. 180 capsule 3   No current facility-administered medications for this visit.    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    2 sons, 1 daughter   100 grandkids   Social Determinants of Health   Financial Resource Strain: Low Risk  (08/19/2021)   Overall Financial Resource Strain (CARDIA)    Difficulty of Paying Living Expenses: Not hard at all  Food Insecurity: No Food Insecurity (08/19/2021)   Hunger Vital Sign    Worried About Running Out of Food in the Last Year:  Never true    Ran Out of Food in the Last Year: Never true  Transportation Needs: No Transportation Needs (08/19/2021)   PRAPARE - Hydrologist (Medical): No    Lack of Transportation (Non-Medical): No  Physical Activity: Inactive (08/19/2021)   Exercise Vital Sign    Days of Exercise per Week: 0 days    Minutes of Exercise per Session: 0 min  Stress: No Stress Concern Present (08/19/2021)   [place]    Feeling of Stress : Not at all  Social Connections: Moderately Isolated (08/19/2021)   Social Connection and Isolation Panel [NHANES]    Frequency of Communication with Friends and Family: Twice a week    Frequency of Social Gatherings with Friends and Family: Once a week    Attends Religious Services: Never    Marine scientist or Organizations: No    Attends Archivist Meetings: Never    Marital Status: Married  Human resources officer Violence: Not At Risk (08/19/2021)   Humiliation, Afraid, Rape, and Kick questionnaire    Fear of Current or Ex-Partner: No    Emotionally Abused: No    Physically Abused: No    Sexually Abused: No    Family History  Problem Relation Age of Onset   Prostate cancer Father    Cancer Father        prostate   Colon cancer Mother    Leukemia Mother    Cancer Mother        colon, leukemia    Review of Systems:  As stated in the HPI and otherwise negative.   BP 130/70   Pulse 66   Ht '5\' 9"'$  (1.753 m)   Wt 96.7 kg   SpO2 99%   BMI 31.48 kg/m   Physical Examination:  General: Well developed, well nourished, NAD  HEENT: OP clear, mucus membranes moist  SKIN: warm, dry. No rashes. Neuro: No focal deficits  Musculoskeletal: Muscle strength 5/5 all ext  Psychiatric: Mood and affect normal  Neck: No JVD, no carotid bruits, no thyromegaly, no lymphadenopathy.  Lungs:Clear bilaterally, no wheezes, rhonci, crackles Cardiovascular: Regular rate  and rhythm. No murmurs, gallops or rubs. Abdomen:Soft. Bowel sounds present. Non-tender.  Extremities: No lower extremity edema. Pulses are 2 + in the bilateral DP/PT.  Echo March 2018: - Left ventricle: The cavity size was normal. Systolic function was   normal. The estimated ejection fraction was in the range of  60%   to 65%. Wall motion was normal; there were no regional wall   motion abnormalities. Left ventricular diastolic function   parameters were normal. - Mitral valve: There was mild regurgitation. - Atrial septum: No defect or patent foramen ovale was identified.  EKG:  EKG is ordered today The ekg ordered today demonstrates NSR, Non-specific T wave abnormaity  Recent Labs: 03/26/2021: ALT 36   Lipid Panel    Component Value Date/Time   CHOL 147 03/26/2021 1139   TRIG 128 03/26/2021 1139   HDL 45 03/26/2021 1139   CHOLHDL 3.3 03/26/2021 1139   CHOLHDL 3 08/19/2018 0802   VLDL 18.8 08/19/2018 0802   LDLCALC 79 03/26/2021 1139     Wt Readings from Last 3 Encounters:  03/21/22 96.7 kg  08/19/21 97.1 kg  03/26/21 97.3 kg    Assessment and Plan:   1. CAD without angina:  No chest pain suggestive of angina. LV function normal by echo in 2018. Will continue Plavix, beta blocker and statin.   2. HTN: BP is well controlled. No changes today  3. HLD: LDL 79 in February 2023. Continue statin. Repeat lipids and LFTs this spring.   4. Carotid artery disease: Stable mild carotid disease by dopplers July 2023.  Will not repeat given his age and mild disease for years.   5. PVCs: No palpitations. Continue metoprolol.   Labs/ tests ordered today include:   Orders Placed This Encounter  Procedures   EKG 12-Lead   Disposition:   F/U with me in 12  months  Signed, Lauree Chandler, MD 03/21/2022 9:32 AM    [facility] [place], [place], [place]  [address] Phone: [phone]; Fax: [phone]

## 2022-05-20 ENCOUNTER — Telehealth: Payer: Self-pay | Admitting: Family Medicine

## 2022-05-20 DIAGNOSIS — E782 Mixed hyperlipidemia: Secondary | ICD-10-CM

## 2022-05-20 DIAGNOSIS — Z8639 Personal history of other endocrine, nutritional and metabolic disease: Secondary | ICD-10-CM

## 2022-05-20 NOTE — Telephone Encounter (Signed)
LOV - 11/05/20 NOV - not scheduled Rf - 11/05/20 #60/0

## 2022-05-20 NOTE — Telephone Encounter (Signed)
Prescription Request  05/20/2022  LOV: Visit date not found  What is the name of the medication or equipment? oxyCODONE-acetaminophen (PERCOCET) 10-325 MG tablet   Have you contacted your pharmacy to request a refill? No   Which pharmacy would you like this sent to?  [id number] [place], [place] - [address] [place] [address] Phone: [phone] Fax: [phone]    Patient notified that their request is being sent to the clinical staff for review and that they should receive a response within 2 business days.   Please advise at Mobile [phone] (mobile)

## 2022-05-21 MED ORDER — OXYCODONE-ACETAMINOPHEN 10-325 MG PO TABS: 1.0000 | ORAL_TABLET | Freq: Four times a day (QID) | ORAL | 0 refills | Status: DC | PRN

## 2022-05-21 NOTE — Telephone Encounter (Signed)
Please cal patient to set up appt with Dr. Para March as requested

## 2022-05-21 NOTE — Telephone Encounter (Signed)
Patient scheduled.

## 2022-05-21 NOTE — Telephone Encounter (Signed)
Sent. Thanks.  [employer] [place] law limits prescription quantity. Needs office visit set up with fasting labs ahead of time.

## 2022-05-21 NOTE — Addendum Note (Signed)
Addended by: Joaquim Nam on: 05/21/2022 01:35 PM   Modules accepted: Orders

## 2022-06-05 ENCOUNTER — Other Ambulatory Visit (INDEPENDENT_AMBULATORY_CARE_PROVIDER_SITE_OTHER): Payer: Medicare PPO

## 2022-06-05 DIAGNOSIS — E782 Mixed hyperlipidemia: Secondary | ICD-10-CM | POA: Diagnosis not present

## 2022-06-05 DIAGNOSIS — Z8639 Personal history of other endocrine, nutritional and metabolic disease: Secondary | ICD-10-CM | POA: Diagnosis not present

## 2022-06-05 LAB — LIPID PANEL
Cholesterol: 115 mg/dL (ref 0–200)
HDL: 32.7 mg/dL — ABNORMAL LOW (ref >39.00)
LDL Cholesterol: 60 mg/dL (ref 0–99)
NonHDL: 82.65
Total CHOL/HDL Ratio: 4
Triglycerides: 113 mg/dL (ref 0.0–149.0)
VLDL: 22.6 mg/dL (ref 0.0–40.0)

## 2022-06-05 LAB — CBC WITH DIFFERENTIAL/PLATELET
Basophils Absolute: 0 10*3/uL (ref 0.0–0.1)
Basophils Relative: 0.5 % (ref 0.0–3.0)
Eosinophils Absolute: 0.3 10*3/uL (ref 0.0–0.7)
Eosinophils Relative: 5.3 % — ABNORMAL HIGH (ref 0.0–5.0)
HCT: 36.2 % — ABNORMAL LOW (ref 39.0–52.0)
Hemoglobin: 12.5 g/dL — ABNORMAL LOW (ref 13.0–17.0)
Lymphocytes Relative: 38.8 % (ref 12.0–46.0)
Lymphs Abs: 2.2 10*3/uL (ref 0.7–4.0)
MCHC: 34.6 g/dL (ref 30.0–36.0)
MCV: 92.9 fl (ref 78.0–100.0)
Monocytes Absolute: 0.6 10*3/uL (ref 0.1–1.0)
Monocytes Relative: 11 % (ref 3.0–12.0)
Neutro Abs: 2.5 10*3/uL (ref 1.4–7.7)
Neutrophils Relative %: 44.4 % (ref 43.0–77.0)
Platelets: 147 10*3/uL — ABNORMAL LOW (ref 150.0–400.0)
RBC: 3.9 Mil/uL — ABNORMAL LOW (ref 4.22–5.81)
RDW: 12.8 % (ref 11.5–15.5)
WBC: 5.5 10*3/uL (ref 4.0–10.5)

## 2022-06-05 LAB — COMPREHENSIVE METABOLIC PANEL
ALT: 23 U/L (ref 0–53)
AST: 19 U/L (ref 0–37)
Albumin: 4.1 g/dL (ref 3.5–5.2)
Alkaline Phosphatase: 86 U/L (ref 39–117)
BUN: 14 mg/dL (ref 6–23)
CO2: 28 mEq/L (ref 19–32)
Calcium: 9.3 mg/dL (ref 8.4–10.5)
Chloride: 105 mEq/L (ref 96–112)
Creatinine, Ser: 0.86 mg/dL (ref 0.40–1.50)
GFR: 77.84 mL/min (ref >60.00)
Glucose, Bld: 152 mg/dL — ABNORMAL HIGH (ref 70–99)
Potassium: 4.6 mEq/L (ref 3.5–5.1)
Sodium: 139 mEq/L (ref 135–145)
Total Bilirubin: 0.9 mg/dL (ref 0.2–1.2)
Total Protein: 6.8 g/dL (ref 6.0–8.3)

## 2022-06-05 LAB — HEMOGLOBIN A1C: Hgb A1c MFr Bld: 7 % — ABNORMAL HIGH (ref 4.6–6.5)

## 2022-06-05 LAB — MICROALBUMIN / CREATININE URINE RATIO
Creatinine,U: 30.3 mg/dL
Microalb Creat Ratio: 2.3 mg/g (ref 0.0–30.0)
Microalb, Ur: <0.7 mg/dL (ref 0.0–1.9)

## 2022-06-13 ENCOUNTER — Ambulatory Visit (INDEPENDENT_AMBULATORY_CARE_PROVIDER_SITE_OTHER): Payer: Medicare PPO | Admitting: Family Medicine

## 2022-06-13 ENCOUNTER — Encounter: Payer: Self-pay | Admitting: Family Medicine

## 2022-06-13 VITALS — BP 124/60 | HR 78 | Temp 97.3°F | Ht 69.0 in | Wt 207.0 lb

## 2022-06-13 DIAGNOSIS — G8929 Other chronic pain: Secondary | ICD-10-CM

## 2022-06-13 DIAGNOSIS — D649 Anemia, unspecified: Secondary | ICD-10-CM

## 2022-06-13 DIAGNOSIS — E782 Mixed hyperlipidemia: Secondary | ICD-10-CM | POA: Diagnosis not present

## 2022-06-13 DIAGNOSIS — M26659 Arthropathy of unspecified temporomandibular joint: Secondary | ICD-10-CM | POA: Diagnosis not present

## 2022-06-13 DIAGNOSIS — Z7189 Other specified counseling: Secondary | ICD-10-CM

## 2022-06-13 DIAGNOSIS — E119 Type 2 diabetes mellitus without complications: Secondary | ICD-10-CM | POA: Diagnosis not present

## 2022-06-13 DIAGNOSIS — Z0000 Encounter for general adult medical examination without abnormal findings: Secondary | ICD-10-CM

## 2022-06-13 MED ORDER — OXYCODONE-ACETAMINOPHEN 10-325 MG PO TABS: 1.0000 | ORAL_TABLET | Freq: Two times a day (BID) | ORAL | 0 refills | Status: DC | PRN

## 2022-06-13 NOTE — Progress Notes (Unsigned)
Chronic back pain, with dec effect from oxycodone with plan for spinal cord stimulator but that was denied by insurance.  Has pain as soon as he gets up to move.  Not drowsy with med use.  Pain across the lower back, just above the belt line.  He is required more frequent oxycodone dosing but is not sedated.  Discussed that I can write for his oxycodone to use as needed but it would be preferred if he can get help via spinal stimulator.   Mild anemia.  No bleeding.  D/w pt about options, ie recheck labs periodically.    Diabetes:  No meds.  Hypoglycemic episodes: no sx  Hyperglycemic episodes: no sx Feet problems: no new issues- prev numbness R foot from injury years ago Blood Sugars averaging: not checked.   eye exam within last year: yes Exercise limited by pain, d/w pt.    Elevated Cholesterol: Using medications without problems: Muscle aches: not from statin Diet compliance: yes Exercise: limited by back pain.    Audible pop at R TMJ with ROM.  Going on for 1 month.  Not painful.  Discussed options.  Defer PSA at this point.   Advance directive-wife designated if patient were incapacitated   PMH and SH reviewed  Meds, vitals, and allergies reviewed.   ROS: Per HPI unless specifically indicated in ROS section   GEN: nad, alert and oriented HEENT: mucous membranes moist NECK: supple w/o LA CV: rrr. PULM: ctab, no inc wob ABD: soft, +bs EXT: no edema SKIN: no acute rash but SKs noted on the back.   Audible crepitus at the right TMJ.  Diabetic foot exam: Normal inspection No skin breakdown No calluses  Normal DP pulses Normal sensation to light touch and monofilament Nails normal

## 2022-06-13 NOTE — Patient Instructions (Addendum)
Recheck in about 3 months.  Labs ahead of time.   You don't have to fast.  Try to cut back on sugars in the meantime.   Take care.  Glad to see you. I'll check with [facility].

## 2022-06-15 DIAGNOSIS — M26659 Arthropathy of unspecified temporomandibular joint: Secondary | ICD-10-CM | POA: Insufficient documentation

## 2022-06-15 DIAGNOSIS — D649 Anemia, unspecified: Secondary | ICD-10-CM | POA: Insufficient documentation

## 2022-06-15 NOTE — Assessment & Plan Note (Signed)
Chronic back pain, with dec effect from oxycodone with plan for spinal cord stimulator but that was denied by insurance.  Has pain as soon as he gets up to move.  Not drowsy with med use.  Pain across the lower back, just above the belt line.  He is required more frequent oxycodone dosing but is not sedated.  I will update the [facility] clinic.  Continue oxycodone as is for now.  He needs a dose approximately twice a day to maintain reasonable pain control.

## 2022-06-15 NOTE — Assessment & Plan Note (Signed)
History of mild anemia noted.  No bleeding.  D/w pt about options, ie recheck labs periodically.  He can update me as needed.

## 2022-06-15 NOTE — Assessment & Plan Note (Signed)
Diabetic based on A1c.  No change in medications.  Exercise affected by back pain.  He is going to work on diet in the meantime.  Recheck labs periodically.

## 2022-06-15 NOTE — Assessment & Plan Note (Signed)
Discussed, would observe since he is not having pain.

## 2022-06-15 NOTE — Assessment & Plan Note (Signed)
Advance directive- wife designated if patient were incapacitated.  

## 2022-06-15 NOTE — Assessment & Plan Note (Signed)
Defer PSA at this point.   Advance directive-wife designated if patient were incapacitated

## 2022-06-15 NOTE — Assessment & Plan Note (Signed)
Continue atorvastatin.  Continue work on diet. 

## 2022-07-12 DIAGNOSIS — Z6829 Body mass index (BMI) 29.0-29.9, adult: Secondary | ICD-10-CM | POA: Diagnosis not present

## 2022-07-12 DIAGNOSIS — M545 Low back pain, unspecified: Secondary | ICD-10-CM | POA: Diagnosis not present

## 2022-07-12 DIAGNOSIS — G8929 Other chronic pain: Secondary | ICD-10-CM | POA: Diagnosis not present

## 2022-07-16 DIAGNOSIS — M545 Low back pain, unspecified: Secondary | ICD-10-CM | POA: Diagnosis not present

## 2022-07-16 DIAGNOSIS — M5136 Other intervertebral disc degeneration, lumbar region: Secondary | ICD-10-CM | POA: Diagnosis not present

## 2022-07-16 DIAGNOSIS — G8929 Other chronic pain: Secondary | ICD-10-CM | POA: Diagnosis not present

## 2022-07-16 DIAGNOSIS — M129 Arthropathy, unspecified: Secondary | ICD-10-CM | POA: Diagnosis not present

## 2022-07-16 DIAGNOSIS — Z79899 Other long term (current) drug therapy: Secondary | ICD-10-CM | POA: Diagnosis not present

## 2022-07-16 DIAGNOSIS — Z6829 Body mass index (BMI) 29.0-29.9, adult: Secondary | ICD-10-CM | POA: Diagnosis not present

## 2022-07-16 DIAGNOSIS — E559 Vitamin D deficiency, unspecified: Secondary | ICD-10-CM | POA: Diagnosis not present

## 2022-07-25 ENCOUNTER — Other Ambulatory Visit: Payer: Self-pay | Admitting: Family Medicine

## 2022-07-25 MED ORDER — OXYCODONE-ACETAMINOPHEN 10-325 MG PO TABS: 1.0000 | ORAL_TABLET | Freq: Two times a day (BID) | ORAL | 0 refills | Status: DC | PRN

## 2022-07-25 NOTE — Progress Notes (Unsigned)
Contacted by pt's wife, needed refill.  Rx sent.

## 2022-08-07 DIAGNOSIS — Z6828 Body mass index (BMI) 28.0-28.9, adult: Secondary | ICD-10-CM | POA: Diagnosis not present

## 2022-08-07 DIAGNOSIS — M545 Low back pain, unspecified: Secondary | ICD-10-CM | POA: Diagnosis not present

## 2022-08-07 DIAGNOSIS — G894 Chronic pain syndrome: Secondary | ICD-10-CM | POA: Diagnosis not present

## 2022-08-07 DIAGNOSIS — Z79899 Other long term (current) drug therapy: Secondary | ICD-10-CM | POA: Diagnosis not present

## 2022-08-07 DIAGNOSIS — M5136 Other intervertebral disc degeneration, lumbar region: Secondary | ICD-10-CM | POA: Diagnosis not present

## 2022-08-21 ENCOUNTER — Telehealth: Payer: Self-pay

## 2022-08-21 NOTE — Telephone Encounter (Signed)
Contacted pt for AWV, pt was upset at the time he answered the phone. I introduced myself and advised the pt of the reason for my call. Pt became angry which was evident by his raised voice and proceeded to cuss at me. Pt stated "do not be bothering me about no wellness visits, if I get sick or want a wellness visit I will call Dr.Duncan and he will do it, so do not call back!" Pt then proceeded to hang up the phone. PCP notified of interaction with pt.

## 2022-09-01 DIAGNOSIS — M545 Low back pain, unspecified: Secondary | ICD-10-CM | POA: Diagnosis not present

## 2022-09-01 DIAGNOSIS — M625A2 Muscle wasting and atrophy, not elsewhere classified, back, lumbosacral: Secondary | ICD-10-CM | POA: Diagnosis not present

## 2022-09-01 DIAGNOSIS — G8929 Other chronic pain: Secondary | ICD-10-CM | POA: Diagnosis not present

## 2022-09-15 DIAGNOSIS — M545 Low back pain, unspecified: Secondary | ICD-10-CM | POA: Diagnosis not present

## 2022-09-15 DIAGNOSIS — G8929 Other chronic pain: Secondary | ICD-10-CM | POA: Diagnosis not present

## 2022-09-16 ENCOUNTER — Other Ambulatory Visit (INDEPENDENT_AMBULATORY_CARE_PROVIDER_SITE_OTHER): Payer: Medicare PPO

## 2022-09-16 DIAGNOSIS — E119 Type 2 diabetes mellitus without complications: Secondary | ICD-10-CM | POA: Diagnosis not present

## 2022-09-16 DIAGNOSIS — D649 Anemia, unspecified: Secondary | ICD-10-CM

## 2022-09-16 LAB — FERRITIN: Ferritin: 122.8 ng/mL (ref 22.0–322.0)

## 2022-09-16 LAB — CBC WITH DIFFERENTIAL/PLATELET
Basophils Absolute: 0 10*3/uL (ref 0.0–0.1)
Basophils Relative: 0.4 % (ref 0.0–3.0)
Eosinophils Absolute: 0.2 10*3/uL (ref 0.0–0.7)
Eosinophils Relative: 4.3 % (ref 0.0–5.0)
HCT: 33.9 % — ABNORMAL LOW (ref 39.0–52.0)
Hemoglobin: 11.5 g/dL — ABNORMAL LOW (ref 13.0–17.0)
Lymphocytes Relative: 36.2 % (ref 12.0–46.0)
Lymphs Abs: 2 10*3/uL (ref 0.7–4.0)
MCHC: 33.9 g/dL (ref 30.0–36.0)
MCV: 92.8 fl (ref 78.0–100.0)
Monocytes Absolute: 0.8 10*3/uL (ref 0.1–1.0)
Monocytes Relative: 13.6 % — ABNORMAL HIGH (ref 3.0–12.0)
Neutro Abs: 2.5 10*3/uL (ref 1.4–7.7)
Neutrophils Relative %: 45.5 % (ref 43.0–77.0)
Platelets: 145 10*3/uL — ABNORMAL LOW (ref 150.0–400.0)
RBC: 3.65 Mil/uL — ABNORMAL LOW (ref 4.22–5.81)
RDW: 13 % (ref 11.5–15.5)
WBC: 5.6 10*3/uL (ref 4.0–10.5)

## 2022-09-16 LAB — HEMOGLOBIN A1C: Hgb A1c MFr Bld: 6.7 % — ABNORMAL HIGH (ref 4.6–6.5)

## 2022-09-16 LAB — IRON: Iron: 89 ug/dL (ref 42–165)

## 2022-09-16 LAB — VITAMIN B12: Vitamin B-12: 139 pg/mL — ABNORMAL LOW (ref 211–911)

## 2022-09-26 ENCOUNTER — Encounter: Payer: Self-pay | Admitting: Family Medicine

## 2022-09-26 ENCOUNTER — Ambulatory Visit: Payer: Medicare PPO | Admitting: Family Medicine

## 2022-09-26 VITALS — BP 116/62 | HR 73 | Temp 98.0°F | Ht 69.0 in | Wt 202.0 lb

## 2022-09-26 DIAGNOSIS — E119 Type 2 diabetes mellitus without complications: Secondary | ICD-10-CM | POA: Diagnosis not present

## 2022-09-26 DIAGNOSIS — M545 Low back pain, unspecified: Secondary | ICD-10-CM

## 2022-09-26 DIAGNOSIS — E538 Deficiency of other specified B group vitamins: Secondary | ICD-10-CM

## 2022-09-26 MED ORDER — CYANOCOBALAMIN 1000 MCG/ML IJ SOLN
1000.0000 ug | Freq: Once | INTRAMUSCULAR | Status: AC
Start: 2022-09-26 — End: 2022-09-26
  Administered 2022-09-26: 1000 ug via INTRAMUSCULAR

## 2022-09-26 MED ORDER — CYANOCOBALAMIN 1000 MCG/ML IJ SOLN: INTRAMUSCULAR | Status: DC

## 2022-09-26 MED ORDER — OXYCODONE-ACETAMINOPHEN 10-325 MG PO TABS: 1.0000 | ORAL_TABLET | Freq: Two times a day (BID) | ORAL | 0 refills | Status: DC | PRN

## 2022-09-26 NOTE — Progress Notes (Unsigned)
B12 def.  Iron wnl.  HGB slightly low, d/w pt.   Diabetes:  No meds Hypoglycemic episodes: no sx  Hyperglycemic episodes: no sx Feet problems: no Blood Sugars averaging: not checked.   eye exam within last year: done 02/2022.  Dr. Dione Booze.    Taking oxycodone for his back, pain at baseline. Still with pain.  No ADE on med.  Not drowsy.  Prev was taking 2 per day, but with less effect per dose compared to prior.  He is checking with [facility] about options.  He has f/u pending.  He has pain relief sitting down.  Refill sent.    Meds, vitals, and allergies reviewed.   ROS: Per HPI unless specifically indicated in ROS section   GEN: nad, alert and oriented HEENT: ncat NECK: supple w/o LA CV: rrr. PULM: ctab, no inc wob ABD: soft, +bs EXT: no edema SKIN: well perfused.

## 2022-09-26 NOTE — Patient Instructions (Addendum)
B12 deficiency.  B12 injections should take care of this.  Weekly dose x4 doses. Then monthly thereafter.   Let us know if you want to get training on doing the shot at home.   B12 dose today and schedule weekly in the meantime.  Recheck in about 3 months, labs ahead of time.   Take care.  Glad to see you.

## 2022-09-29 DIAGNOSIS — E538 Deficiency of other specified B group vitamins: Secondary | ICD-10-CM | POA: Insufficient documentation

## 2022-09-29 NOTE — Assessment & Plan Note (Signed)
No change in medications at this point.  Labs discussed with patient.  Diet and exercise discussed with patient.  Exercise limited by back pain.  Recheck periodically.

## 2022-09-29 NOTE — Assessment & Plan Note (Signed)
Taking oxycodone for his back, pain at baseline. Still with pain.  No ADE on med.  Not drowsy.  Prev was taking 2 per day, but with less effect per dose compared to prior.  He is checking with [facility] about options.  He has f/u pending.  He has pain relief sitting down.  Refill sent.   Continue oxycodone as is.

## 2022-09-29 NOTE — Assessment & Plan Note (Signed)
Discussed rationale for placement. Start injections. Weekly dose x4 doses. Then monthly thereafter.   Let us know if you want to get training on doing the shot at home.  We can recheck labs periodically.

## 2022-10-02 ENCOUNTER — Telehealth: Payer: Self-pay | Admitting: Family Medicine

## 2022-10-02 ENCOUNTER — Ambulatory Visit (INDEPENDENT_AMBULATORY_CARE_PROVIDER_SITE_OTHER): Payer: Medicare PPO

## 2022-10-02 DIAGNOSIS — E538 Deficiency of other specified B group vitamins: Secondary | ICD-10-CM | POA: Diagnosis not present

## 2022-10-02 MED ORDER — CYANOCOBALAMIN 1000 MCG/ML IJ SOLN
1000.0000 ug | Freq: Once | INTRAMUSCULAR | Status: AC
  Administered 2022-10-02: 1000 ug via INTRAMUSCULAR

## 2022-10-02 NOTE — Progress Notes (Signed)
Per orders of Dr. Crawford Givens, injection of vitamin b 12 given by Lewanda Rife in right deltoid. Patient tolerated injection well. Patient will make appointment for 1 week.

## 2022-10-02 NOTE — Telephone Encounter (Signed)
Contacted [name]. to schedule their annual wellness visit. Patient declined to schedule AWV at this time.  [facility] Care Guide [facility] AWV TEAM Direct Dial: [phone]

## 2022-10-09 ENCOUNTER — Ambulatory Visit (INDEPENDENT_AMBULATORY_CARE_PROVIDER_SITE_OTHER): Payer: Medicare PPO

## 2022-10-09 DIAGNOSIS — E538 Deficiency of other specified B group vitamins: Secondary | ICD-10-CM

## 2022-10-09 MED ORDER — CYANOCOBALAMIN 1000 MCG/ML IJ SOLN
1000.0000 ug | Freq: Once | INTRAMUSCULAR | Status: AC
  Administered 2022-10-09: 1000 ug via INTRAMUSCULAR

## 2022-10-09 NOTE — Progress Notes (Signed)
Per orders of Dr. Crawford Givens, injection of vitamin b12  given by Lewanda Rife in left deltoid. Patient tolerated injection well. Patient will make appointment for 1 week.

## 2022-10-16 ENCOUNTER — Ambulatory Visit (INDEPENDENT_AMBULATORY_CARE_PROVIDER_SITE_OTHER): Payer: Medicare PPO

## 2022-10-16 DIAGNOSIS — E538 Deficiency of other specified B group vitamins: Secondary | ICD-10-CM | POA: Diagnosis not present

## 2022-10-16 MED ORDER — CYANOCOBALAMIN 1000 MCG/ML IJ SOLN
1000.0000 ug | Freq: Once | INTRAMUSCULAR | Status: AC
  Administered 2022-10-16: 1000 ug via INTRAMUSCULAR

## 2022-10-16 NOTE — Progress Notes (Signed)
Per orders of Dr. Crawford Givens, injection of vitamin b 12 given by Lewanda Rife in right deltoid. Patient tolerated injection well. Patient will make appointment for 1 month.

## 2022-10-28 DIAGNOSIS — Z9181 History of falling: Secondary | ICD-10-CM | POA: Diagnosis not present

## 2022-10-28 DIAGNOSIS — M51369 Other intervertebral disc degeneration, lumbar region without mention of lumbar back pain or lower extremity pain: Secondary | ICD-10-CM | POA: Diagnosis not present

## 2022-10-28 DIAGNOSIS — Z6828 Body mass index (BMI) 28.0-28.9, adult: Secondary | ICD-10-CM | POA: Diagnosis not present

## 2022-10-28 DIAGNOSIS — Z79899 Other long term (current) drug therapy: Secondary | ICD-10-CM | POA: Diagnosis not present

## 2022-10-28 DIAGNOSIS — M25551 Pain in right hip: Secondary | ICD-10-CM | POA: Diagnosis not present

## 2022-10-28 DIAGNOSIS — M25552 Pain in left hip: Secondary | ICD-10-CM | POA: Diagnosis not present

## 2022-10-28 DIAGNOSIS — M545 Low back pain, unspecified: Secondary | ICD-10-CM | POA: Diagnosis not present

## 2022-10-28 DIAGNOSIS — G894 Chronic pain syndrome: Secondary | ICD-10-CM | POA: Diagnosis not present

## 2022-10-31 DIAGNOSIS — Z79899 Other long term (current) drug therapy: Secondary | ICD-10-CM | POA: Diagnosis not present

## 2022-11-18 ENCOUNTER — Ambulatory Visit (INDEPENDENT_AMBULATORY_CARE_PROVIDER_SITE_OTHER): Payer: Medicare PPO

## 2022-11-18 DIAGNOSIS — E538 Deficiency of other specified B group vitamins: Secondary | ICD-10-CM | POA: Diagnosis not present

## 2022-11-18 MED ORDER — CYANOCOBALAMIN 1000 MCG/ML IJ SOLN
1000.0000 ug | Freq: Once | INTRAMUSCULAR | Status: AC
Start: 2022-11-18 — End: 2022-11-18
  Administered 2022-11-18: 1000 ug via INTRAMUSCULAR

## 2022-11-18 NOTE — Progress Notes (Signed)
Patient presented for B 12 injection given by Rhylan Kagel, CMA to left deltoid, patient voiced no concerns nor showed any signs of distress during injection.  

## 2022-12-19 ENCOUNTER — Ambulatory Visit (INDEPENDENT_AMBULATORY_CARE_PROVIDER_SITE_OTHER): Payer: Medicare PPO

## 2022-12-19 ENCOUNTER — Other Ambulatory Visit: Payer: Self-pay

## 2022-12-19 DIAGNOSIS — E538 Deficiency of other specified B group vitamins: Secondary | ICD-10-CM | POA: Diagnosis not present

## 2022-12-19 MED ORDER — CYANOCOBALAMIN 1000 MCG/ML IJ SOLN
1000.0000 ug | Freq: Once | INTRAMUSCULAR | Status: AC
Start: 2022-12-19 — End: 2022-12-19
  Administered 2022-12-19: 1000 ug via INTRAMUSCULAR

## 2022-12-19 NOTE — Progress Notes (Signed)
Per orders of Dr. Crawford Givens, injection of vitamin b 12 given by Lewanda Rife in right deltoid. Patient tolerated injection well. Patient will make appointment for 1 month.

## 2022-12-19 NOTE — Telephone Encounter (Signed)
Prescription Request  12/19/2022  LOV: 09/26/2022 What is the name of the medication or equipment? Oxycodone apap 10 - 325 mg  Have you contacted your pharmacy to request a refill? No   Which [facility] [place]   Patient notified that their request is being sent to the clinical staff for review and that they should receive a response within 2 business days.   Please advise at [facility] [phone]     Pt request refill on oxycodone apap 10-325 mg; pt noted her takes 2 pills a day; one at breakfast and lunch.

## 2022-12-21 ENCOUNTER — Other Ambulatory Visit: Payer: Self-pay | Admitting: Family Medicine

## 2022-12-21 MED ORDER — CYANOCOBALAMIN 1000 MCG/ML IJ SOLN: INTRAMUSCULAR | Status: AC

## 2022-12-21 MED ORDER — OXYCODONE-ACETAMINOPHEN 10-325 MG PO TABS: 1.0000 | ORAL_TABLET | Freq: Two times a day (BID) | ORAL | 0 refills | Status: DC | PRN

## 2022-12-21 NOTE — Telephone Encounter (Signed)
Sent. Thanks.   

## 2023-01-14 DIAGNOSIS — H43392 Other vitreous opacities, left eye: Secondary | ICD-10-CM | POA: Diagnosis not present

## 2023-01-14 DIAGNOSIS — Z961 Presence of intraocular lens: Secondary | ICD-10-CM | POA: Diagnosis not present

## 2023-01-20 ENCOUNTER — Ambulatory Visit: Payer: Medicare PPO | Admitting: Internal Medicine

## 2023-01-20 ENCOUNTER — Ambulatory Visit: Payer: Medicare PPO

## 2023-01-20 ENCOUNTER — Encounter: Payer: Self-pay | Admitting: Internal Medicine

## 2023-01-20 VITALS — BP 138/80 | HR 66 | Temp 98.5°F | Ht 69.0 in | Wt 207.0 lb

## 2023-01-20 DIAGNOSIS — E538 Deficiency of other specified B group vitamins: Secondary | ICD-10-CM | POA: Diagnosis not present

## 2023-01-20 DIAGNOSIS — R040 Epistaxis: Secondary | ICD-10-CM

## 2023-01-20 MED ORDER — CYANOCOBALAMIN 1000 MCG/ML IJ SOLN
1000.0000 ug | Freq: Once | INTRAMUSCULAR | Status: AC
Start: 2023-01-20 — End: 2023-01-20
  Administered 2023-01-20: 1000 ug via INTRAMUSCULAR

## 2023-01-20 NOTE — Addendum Note (Signed)
Addended by: Tillman Abide I on: 01/20/2023 12:35 PM   Modules accepted: Level of Service

## 2023-01-20 NOTE — Progress Notes (Signed)
Subjective:    Patient ID: [name]., male    DOB: 1933/01/05, 89 y.o.   MRN: [id number]  HPI Here due to nosebleeds  Has had nosebleeds that started 2-3 weeks ago Decided to stop the plavix--that helped Went back on plavix and noticed bleeding again yesterday  Occasionally gets sensation of it down his throat Mostly from nostril Started on right--now seems to be on left Usually will start when he blows his nose Did use tissue in there once----otherwise it just stops on its own  No trauma to nose No prior problems  Current Outpatient Medications on File Prior to Visit  Medication Sig Dispense Refill   atorvastatin (LIPITOR) 80 MG tablet Take 1 tablet (80 mg total) by mouth daily. 90 tablet 3   calcium carbonate (OS-CAL) 600 MG TABS Take 600 mg by mouth daily.     clopidogrel (PLAVIX) 75 MG tablet Take 1 tablet (75 mg total) by mouth daily. 90 tablet 3   cyanocobalamin (VITAMIN B12) 1000 MCG/ML injection 1000 mcg IM monthly     Fish Oil OIL Take 1 capsule by mouth daily.     metoprolol tartrate (LOPRESSOR) 50 MG tablet Take 1 tablet (50 mg total) by mouth 2 (two) times daily. 180 tablet 3   nitroGLYCERIN (NITROSTAT) 0.4 MG SL tablet Place 1 tablet (0.4 mg total) under the tongue every 5 (five) minutes as needed. For chest pain 25 tablet 6   oxyCODONE-acetaminophen (PERCOCET) 10-325 MG tablet Take 1 tablet by mouth 2 (two) times daily as needed (sedation caution). 60 tablet 0   ramipril (ALTACE) 2.5 MG capsule Take 1 capsule (2.5 mg total) by mouth 2 (two) times daily. 180 capsule 3   No current facility-administered medications on file prior to visit.    Allergies  Allergen Reactions   Doxycycline Rash   Tetracycline Rash    Past Medical History:  Diagnosis Date   CAD (coronary artery disease)    PTCA 1998 LAD-s/p overlapping DES in LAD in 05/2003   Chest pain 5/5-5/11/2003   Hospital MI, CAD, INCR, CHOL, HTN   Diabetes mellitus without complication (HCC)     Elevated glucose    History of colonoscopy    HTN (hypertension)    Hyperlipidemia    Impotence    of organic nature   Otitis media    Prostate cancer [facility])    s/p prostatectomy   Spinal disease    lumbosacral    Past Surgical History:  Procedure Laterality Date   ANGIOPLASTY  1992   bone spur right foot     Bilateral   CORONARY ANGIOPLASTY  06/02/2003   PTCA, occl of vessel w/good collaterals   LAMINECTOMY     left shoulder decompression     pilonidal cystectomy  late 20s   POSTERIOR LUMBAR FUSION Bilateral 09/2017   L3-S1 PLIF Dr. Trey Sailors   PROSTATECTOMY     right carpal tunnel repair  11/18/2007   Dr. Teressa Senter    Family History  Problem Relation Age of Onset   Prostate cancer Father    Cancer Father        prostate   Colon cancer Mother    Leukemia Mother    Cancer Mother        colon, leukemia    Social History   Socioeconomic History   Marital status: Married    Spouse name: Not on file   Number of children: 3   Years of education: Not on file  Highest education level: Not on file  Occupational History   Occupation: retired-field coord for [employer]  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    2 sons, 1 daughter   33 grandkids   Social Drivers of Corporate investment banker Strain: Low Risk  (09/01/2022)   Received from [facility]   Overall Financial Resource Strain (CARDIA)    Difficulty of Paying Living Expenses: Not hard at all  Food Insecurity: No Food Insecurity (09/01/2022)   Received from [facility]   Hunger  Vital Sign    Worried About Running Out of Food in the Last Year: Never true    Ran Out of Food in the Last Year: Never true  Transportation Needs: No Transportation Needs (09/01/2022)   Received from [facility] - Transportation    In the past 12 months, has lack of transportation kept you from medical appointments or from getting medications?: No    Lack of Transportation (Non-Medical): No  Physical Activity: Inactive (08/19/2021)   Exercise Vital Sign    Days of Exercise per Week: 0 days    Minutes of Exercise per Session: 0 min  Stress: No Stress Concern Present (08/19/2021)   [employer] of Occupational Health - Occupational Stress Questionnaire    Feeling of Stress : Not at all  Social Connections: Moderately Isolated (08/19/2021)   Social Connection and Isolation Panel [NHANES]    Frequency of Communication with Friends and Family: Twice a week    Frequency of Social Gatherings with Friends and Family: Once a week    Attends Religious Services: Never    Database administrator or Organizations: No    Attends Banker Meetings: Never    Marital Status: Married  Catering manager Violence: Not At Risk (08/19/2021)   Humiliation, Afraid, Rape, and Kick questionnaire    Fear of Current or Ex-Partner: No    Emotionally Abused: No    Physically Abused: No    Sexually Abused: No   Review of Systems No cold or sinus symptoms Electric heat---no humidifier     Objective:   Physical Exam HENT:     Nose:     Comments: Crusted dried blood on distal lesions on both sides of septum           Assessment & Plan:

## 2023-01-20 NOTE — Assessment & Plan Note (Addendum)
Seems to be mostly from the dry air--asked him to get humidifier (40% humidity is best level) Could consider nasal saline---but asked him to use neosporin on Q-tip to coat both sides of septum (several times a day) Should go back on the plavix Could try cautery if persists---but that may be irritating Consider ENT as well

## 2023-01-20 NOTE — Assessment & Plan Note (Signed)
Will give B12 today

## 2023-01-20 NOTE — Addendum Note (Signed)
Addended by: Eual Fines on: 01/20/2023 12:41 PM   Modules accepted: Orders

## 2023-02-24 ENCOUNTER — Other Ambulatory Visit: Payer: Self-pay

## 2023-02-24 ENCOUNTER — Ambulatory Visit (INDEPENDENT_AMBULATORY_CARE_PROVIDER_SITE_OTHER): Payer: Medicare PPO

## 2023-02-24 DIAGNOSIS — E538 Deficiency of other specified B group vitamins: Secondary | ICD-10-CM

## 2023-02-24 MED ORDER — CYANOCOBALAMIN 1000 MCG/ML IJ SOLN
1000.0000 ug | Freq: Once | INTRAMUSCULAR | Status: AC
Start: 2023-02-24 — End: 2023-02-24
  Administered 2023-02-24: 1000 ug via INTRAMUSCULAR

## 2023-02-24 NOTE — Progress Notes (Signed)
Per orders of Dr. Crawford Givens who is out of office and Dr Eustaquio Boyden who is in office, injection of vitamin b 12 given by Lewanda Rife in right deltoid. Patient tolerated injection well. Patient will make appointment for 1 month.

## 2023-02-24 NOTE — Telephone Encounter (Signed)
Prescription Request  02/24/2023  LOV: Visit date acute 01/20/23 and with Dr Para March 09/26/22  What is the name of the medication or equipment? Oxycodone apap 10-325 last refilled # 60 on 12/21/22.  Have you contacted your pharmacy to request a refill? No   Which pharmacy would you like this sent to?  [id number], [place] - [address] [place] [address] Phone: [phone] Fax: [phone]    Patient notified that their request is being sent to the clinical staff for review and that they should receive a response within 2 business days.   Please advise at [facility] [phone] .   Pt is not out of med; pt has 4 pills left.

## 2023-02-25 MED ORDER — OXYCODONE-ACETAMINOPHEN 10-325 MG PO TABS: 1.0000 | ORAL_TABLET | Freq: Two times a day (BID) | ORAL | 0 refills | Status: DC | PRN

## 2023-03-16 NOTE — Progress Notes (Unsigned)
No chief complaint on file.  History of Present Illness: 89 yo male with history of CAD, HTN, HLD, PVCs here today for cardiac follow up. In 1998 he had PTCA of the LAD. In 2005 he had overlapping drug-eluting stents placed in the LAD. He has mild carotid artery disease by carotid artery dopplers July 2023. 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 He had Lyme disease in 2018. Echo March 2018 with normal LV systolic and diastolic function, mild MR.   He is here today for follow up. The patient denies any chest pain, dyspnea, palpitations, lower extremity edema, orthopnea, PND, dizziness, near syncope or syncope.    Primary Care Physician: Joaquim Nam, MD  Past Medical History:  Diagnosis Date   CAD (coronary artery disease)    PTCA 1998 LAD-s/p overlapping DES in LAD in 05/2003   Chest pain 5/5-5/11/2003   Hospital MI, CAD, INCR, CHOL, HTN   Diabetes mellitus without complication (HCC)    Elevated glucose    History of colonoscopy    HTN (hypertension)    Hyperlipidemia    Impotence    of organic nature   Otitis media    Prostate cancer [facility])    s/p prostatectomy   Spinal disease    lumbosacral    Past Surgical History:  Procedure Laterality Date   ANGIOPLASTY  1992   bone spur right foot     Bilateral   CORONARY ANGIOPLASTY  06/02/2003   PTCA, occl of vessel w/good collaterals   LAMINECTOMY     left shoulder decompression     pilonidal cystectomy  late 20s   POSTERIOR LUMBAR FUSION Bilateral 09/2017   L3-S1 PLIF Dr. Trey Sailors   PROSTATECTOMY     right carpal tunnel repair  11/18/2007   Dr. Teressa Senter    Current Outpatient Medications  Medication Sig Dispense Refill   atorvastatin (LIPITOR) 80 MG tablet Take 1 tablet (80 mg total) by mouth daily. 90 tablet 3   calcium carbonate (OS-CAL) 600 MG TABS Take 600 mg by mouth daily.     clopidogrel (PLAVIX) 75 MG tablet Take 1 tablet (75 mg total) by mouth  daily. 90 tablet 3   cyanocobalamin (VITAMIN B12) 1000 MCG/ML injection 1000 mcg IM monthly     Fish Oil OIL Take 1 capsule by mouth daily.     metoprolol tartrate (LOPRESSOR) 50 MG tablet Take 1 tablet (50 mg total) by mouth 2 (two) times daily. 180 tablet 3   nitroGLYCERIN (NITROSTAT) 0.4 MG SL tablet Place 1 tablet (0.4 mg total) under the tongue every 5 (five) minutes as needed. For chest pain 25 tablet 6   oxyCODONE-acetaminophen (PERCOCET) 10-325 MG tablet Take 1 tablet by mouth 2 (two) times daily as needed (sedation caution). 60 tablet 0   ramipril (ALTACE) 2.5 MG capsule Take 1 capsule (2.5 mg total) by mouth 2 (two) times daily. 180 capsule 3   No current facility-administered medications for this visit.    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    2 sons, 1 daughter   85 grandkids   Social Drivers of Corporate investment banker Strain: Low Risk  (09/01/2022)   Received from [facility]   Overall Financial Resource Strain (CARDIA)    Difficulty of Paying Living Expenses: Not hard at all  Food Insecurity: No Food Insecurity (09/01/2022)   Received from [facility]   Hunger Vital Sign    Worried About Running Out  of Food in the Last Year: Never true    Ran Out of Food in the Last Year: Never true  Transportation Needs: No Transportation Needs (09/01/2022)   Received from [facility] - Transportation    In the past 12 months, has lack of transportation kept you from medical appointments or from getting medications?: No    Lack of Transportation (Non-Medical): No  Physical Activity: Inactive (08/19/2021)   Exercise Vital Sign    Days of Exercise per Week: 0 days    Minutes of Exercise per Session: 0 min  Stress: No Stress Concern Present (08/19/2021)   [employer] of Occupational Health - Occupational Stress Questionnaire    Feeling of Stress : Not at all  Social Connections: Moderately Isolated (08/19/2021)   Social Connection and Isolation Panel [NHANES]    Frequency of Communication with Friends and Family: Twice a week    Frequency of Social Gatherings with Friends and Family: Once a week    Attends Religious Services: Never    Database administrator or Organizations: No    Attends Banker Meetings: Never    Marital Status: Married  Catering manager Violence: Not At Risk (08/19/2021)   Humiliation, Afraid, Rape, and Kick questionnaire    Fear of Current or Ex-Partner: No    Emotionally Abused: No    Physically Abused: No    Sexually Abused: No    Family History  Problem Relation Age of Onset   Prostate cancer Father    Cancer Father        prostate   Colon cancer Mother    Leukemia Mother    Cancer Mother        colon, leukemia    Review of Systems:  As stated in the HPI and otherwise negative.   There were no vitals taken for this visit.  Physical Examination:  General: Well developed, well nourished, NAD  HEENT: OP clear, mucus membranes moist  SKIN: warm, dry. No rashes. Neuro: No focal deficits  Musculoskeletal: Muscle strength 5/5 all ext  Psychiatric: Mood and affect normal  Neck: No JVD, no carotid bruits, no thyromegaly,  no lymphadenopathy.  Lungs:Clear bilaterally, no wheezes, rhonci, crackles Cardiovascular: Regular rate and rhythm. No murmurs, gallops or rubs. Abdomen:Soft. Bowel sounds present. Non-tender.  Extremities: No lower extremity edema. Pulses are 2 + in the bilateral DP/PT.  Echo March 2018: - Left ventricle: The cavity size was normal. Systolic function was   normal. The estimated ejection fraction was in the  range of 60%   to 65%. Wall motion was normal; there were no regional wall   motion abnormalities. Left ventricular diastolic function   parameters were normal. - Mitral valve: There was mild regurgitation. - Atrial septum: No defect or patent foramen ovale was identified.  EKG:  EKG is *** ordered today The ekg ordered today demonstrates   Recent Labs: 06/05/2022: ALT 23; BUN 14; Creatinine, Ser 0.86; Potassium 4.6; Sodium 139 09/16/2022: Hemoglobin 11.5; Platelets 145.0   Lipid Panel    Component Value Date/Time   CHOL 115 06/05/2022 0743   CHOL 147 03/26/2021 1139   TRIG 113.0 06/05/2022 0743   HDL 32.70 (L) 06/05/2022 0743   HDL 45 03/26/2021 1139   CHOLHDL 4 06/05/2022 0743   VLDL 22.6 06/05/2022 0743   LDLCALC 60 06/05/2022 0743   LDLCALC 79 03/26/2021 1139     Wt Readings from Last 3 Encounters:  01/20/23 93.9 kg  09/26/22 91.6 kg  06/13/22 93.9 kg    Assessment and Plan:   1. CAD without angina:  No chest pain. Continue Plavix, statin and beta blocker.    2. HTN: BP is well controlled. Continue metoprolol and Ramipril.   3. HLD: LDL 60 in May 2024. Continue statin.   4. Carotid artery disease: Stable mild carotid disease by dopplers July 2023.  Will not repeat given his age and mild disease for years.   5. PVCs: No palpitations. Continue metoprolol   Labs/ tests ordered today include:  No orders of the defined types were placed in this encounter.  Disposition:   F/U with me in 12  months  Signed, Verne Carrow, MD 03/16/2023 1:49 PM    [facility] [address] [place], [place], [place]  [address] Phone: [phone]; Fax: [phone]

## 2023-03-17 ENCOUNTER — Ambulatory Visit: Payer: Medicare PPO | Attending: Cardiovascular Disease | Admitting: Cardiovascular Disease

## 2023-03-17 ENCOUNTER — Encounter: Payer: Self-pay | Admitting: Cardiovascular Disease

## 2023-03-17 VITALS — BP 140/70 | HR 61 | Ht 69.0 in | Wt 209.2 lb

## 2023-03-17 DIAGNOSIS — I493 Ventricular premature depolarization: Secondary | ICD-10-CM

## 2023-03-17 DIAGNOSIS — I2510 Atherosclerotic heart disease of native coronary artery without angina pectoris: Secondary | ICD-10-CM

## 2023-03-17 DIAGNOSIS — I340 Nonrheumatic mitral (valve) insufficiency: Secondary | ICD-10-CM

## 2023-03-17 DIAGNOSIS — I10 Essential (primary) hypertension: Secondary | ICD-10-CM

## 2023-03-17 DIAGNOSIS — I6523 Occlusion and stenosis of bilateral carotid arteries: Secondary | ICD-10-CM | POA: Diagnosis not present

## 2023-03-17 DIAGNOSIS — E7849 Other hyperlipidemia: Secondary | ICD-10-CM

## 2023-03-17 NOTE — Patient Instructions (Signed)
 Medication Instructions:  No changes *If you need a refill on your cardiac medications before your next appointment, please call your pharmacy*   Lab Work: none   Testing/Procedures: Your physician has requested that you have an echocardiogram. Echocardiography is a painless test that uses sound waves to create images of your heart. It provides your doctor with information about the size and shape of your heart and how well your heart's chambers and valves are working. This procedure takes approximately one hour. There are no restrictions for this procedure. Please do NOT wear cologne, perfume, aftershave, or lotions (deodorant is allowed). Please arrive 15 minutes prior to your appointment time.  Please note: We ask at that you not bring children with you during ultrasound (echo/ vascular) testing. Due to room size and safety concerns, children are not allowed in the ultrasound rooms during exams. Our front office staff cannot provide observation of children in our lobby area while testing is being conducted. An adult accompanying a patient to their appointment will only be allowed in the ultrasound room at the discretion of the ultrasound technician under special circumstances. We apologize for any inconvenience.    Follow-Up: At [facility], you and your health needs are our priority.  As part of our continuing mission to provide you with exceptional heart care, we have created designated Provider Care Teams.  These Care Teams include your primary Cardiologist (physician) and Advanced Practice Providers (APPs -  Physician Assistants and Nurse Practitioners) who all work together to provide you with the care you need, when you need it.   Your next appointment:   12 month(s)  Provider:   Verne Carrow, MD

## 2023-03-26 ENCOUNTER — Other Ambulatory Visit: Payer: Self-pay

## 2023-03-26 ENCOUNTER — Ambulatory Visit (INDEPENDENT_AMBULATORY_CARE_PROVIDER_SITE_OTHER): Payer: Medicare PPO

## 2023-03-26 DIAGNOSIS — E538 Deficiency of other specified B group vitamins: Secondary | ICD-10-CM | POA: Diagnosis not present

## 2023-03-26 MED ORDER — CYANOCOBALAMIN 1000 MCG/ML IJ SOLN
1000.0000 ug | Freq: Once | INTRAMUSCULAR | Status: AC
  Administered 2023-03-26: 1000 ug via INTRAMUSCULAR

## 2023-03-26 NOTE — Telephone Encounter (Signed)
 Prescription Request  03/26/2023  LOV: 01/20/24 was acute; last saw Dr Para March 09/26/22  What is the name of the medication or equipment? Oxycodone apap 10-325 mg  for back pain last filled # 60 on 02/25/23.  Have you contacted your pharmacy to request a refill? No   Which pharmacy would you like this sent to?  [id number], [place] - [address] [place] [address] Phone: [phone] Fax: [phone]    Patient notified that their request is being sent to the clinical staff for review and that they should receive a response within 2 business days.   Please advise at [facility] [phone]    Pt said has enough med to last about 1 wk;

## 2023-03-26 NOTE — Progress Notes (Signed)
 Per orders of Dr. Crawford Givens, injection of vitamin b 12 given by Lewanda Rife in left deltoid. Patient tolerated injection well. Patient will make appointment for 1 month.

## 2023-03-27 MED ORDER — OXYCODONE-ACETAMINOPHEN 10-325 MG PO TABS: 1.0000 | ORAL_TABLET | Freq: Two times a day (BID) | ORAL | 0 refills | Status: DC | PRN

## 2023-03-27 NOTE — Telephone Encounter (Signed)
 Sent. Thanks.

## 2023-03-31 ENCOUNTER — Other Ambulatory Visit: Payer: Self-pay | Admitting: Cardiovascular Disease

## 2023-03-31 DIAGNOSIS — I2510 Atherosclerotic heart disease of native coronary artery without angina pectoris: Secondary | ICD-10-CM

## 2023-03-31 DIAGNOSIS — E7849 Other hyperlipidemia: Secondary | ICD-10-CM

## 2023-03-31 DIAGNOSIS — I10 Essential (primary) hypertension: Secondary | ICD-10-CM

## 2023-04-02 ENCOUNTER — Ambulatory Visit (HOSPITAL_COMMUNITY): Payer: Medicare PPO | Attending: Cardiovascular Disease

## 2023-04-02 DIAGNOSIS — I340 Nonrheumatic mitral (valve) insufficiency: Secondary | ICD-10-CM | POA: Insufficient documentation

## 2023-04-02 LAB — ECHOCARDIOGRAM COMPLETE
Area-P 1/2: 2.95 cm2
S' Lateral: 2.2 cm

## 2023-04-28 ENCOUNTER — Other Ambulatory Visit: Payer: Self-pay

## 2023-04-28 ENCOUNTER — Ambulatory Visit (INDEPENDENT_AMBULATORY_CARE_PROVIDER_SITE_OTHER): Payer: Medicare PPO

## 2023-04-28 DIAGNOSIS — E538 Deficiency of other specified B group vitamins: Secondary | ICD-10-CM | POA: Diagnosis not present

## 2023-04-28 MED ORDER — CYANOCOBALAMIN 1000 MCG/ML IJ SOLN
1000.0000 ug | Freq: Once | INTRAMUSCULAR | Status: AC
  Administered 2023-04-28: 1000 ug via INTRAMUSCULAR

## 2023-04-28 NOTE — Progress Notes (Signed)
 Per orders of Dr. Crawford Givens, injection of vitamin b 12 given by Lewanda Rife in right deltoid. Patient tolerated injection well. Patient will make appointment for 1 month.

## 2023-04-28 NOTE — Telephone Encounter (Signed)
 Prescription Request  04/28/2023  LOV: Visit wit Dr Para March 09/26/2022 ands acute visit with Dr Alphonsus Sias on 01/20/23.  What is the name of the medication or equipment? Oxycodone apap 10-325 mg  Have you contacted your pharmacy to request a refill? No   Which pharmacy would you like this sent to?  [id number], [place] - [address] [place] [address] Phone: [phone] Fax: [phone]    Patient notified that their request is being sent to the clinical staff for review and that they should receive a response within 2 business days.   Please advise at Mobile [phone] (mobile)   Last refilled #60 on 03/27/23 (pt said he has to take bid to make it thru the day).  Sending request to Dr Para March

## 2023-04-29 ENCOUNTER — Telehealth: Payer: Self-pay | Admitting: Family Medicine

## 2023-04-29 MED ORDER — OXYCODONE-ACETAMINOPHEN 10-325 MG PO TABS: 1.0000 | ORAL_TABLET | Freq: Two times a day (BID) | ORAL | 0 refills | Status: DC | PRN

## 2023-04-29 NOTE — Telephone Encounter (Signed)
 Patient scheduled.

## 2023-04-29 NOTE — Telephone Encounter (Signed)
 Sent. Thanks.

## 2023-04-29 NOTE — Telephone Encounter (Signed)
 FYI

## 2023-04-29 NOTE — Telephone Encounter (Signed)
-----   Message from [facility] T sent at 04/29/2023 11:51 AM EDT -----  ----- Message ----- From: Joaquim Nam, MD Sent: 04/29/2023  11:40 AM EDT To: Albertine Patricia  Please patient scheduled for lab visit with OV after that.  Thanks.

## 2023-04-30 ENCOUNTER — Other Ambulatory Visit

## 2023-04-30 DIAGNOSIS — E538 Deficiency of other specified B group vitamins: Secondary | ICD-10-CM | POA: Diagnosis not present

## 2023-04-30 DIAGNOSIS — E119 Type 2 diabetes mellitus without complications: Secondary | ICD-10-CM

## 2023-04-30 LAB — CBC WITH DIFFERENTIAL/PLATELET
Basophils Absolute: 0 10*3/uL (ref 0.0–0.1)
Basophils Relative: 0.6 % (ref 0.0–3.0)
Eosinophils Absolute: 0.2 10*3/uL (ref 0.0–0.7)
Eosinophils Relative: 3.8 % (ref 0.0–5.0)
HCT: 35.5 % — ABNORMAL LOW (ref 39.0–52.0)
Hemoglobin: 12.3 g/dL — ABNORMAL LOW (ref 13.0–17.0)
Lymphocytes Relative: 36.8 % (ref 12.0–46.0)
Lymphs Abs: 2.2 10*3/uL (ref 0.7–4.0)
MCHC: 34.6 g/dL (ref 30.0–36.0)
MCV: 92.9 fl (ref 78.0–100.0)
Monocytes Absolute: 0.7 10*3/uL (ref 0.1–1.0)
Monocytes Relative: 12.4 % — ABNORMAL HIGH (ref 3.0–12.0)
Neutro Abs: 2.7 10*3/uL (ref 1.4–7.7)
Neutrophils Relative %: 46.4 % (ref 43.0–77.0)
Platelets: 186 10*3/uL (ref 150.0–400.0)
RBC: 3.82 Mil/uL — ABNORMAL LOW (ref 4.22–5.81)
RDW: 13 % (ref 11.5–15.5)
WBC: 5.9 10*3/uL (ref 4.0–10.5)

## 2023-04-30 LAB — VITAMIN B12: Vitamin B-12: 1177 pg/mL — ABNORMAL HIGH (ref 211–911)

## 2023-04-30 LAB — HEMOGLOBIN A1C: Hgb A1c MFr Bld: 7.2 % — ABNORMAL HIGH (ref 4.6–6.5)

## 2023-05-05 ENCOUNTER — Ambulatory Visit: Admitting: Family Medicine

## 2023-05-05 ENCOUNTER — Encounter: Payer: Self-pay | Admitting: Family Medicine

## 2023-05-05 VITALS — BP 132/62 | HR 68 | Temp 98.7°F | Ht 69.0 in | Wt 207.2 lb

## 2023-05-05 DIAGNOSIS — R040 Epistaxis: Secondary | ICD-10-CM

## 2023-05-05 DIAGNOSIS — E538 Deficiency of other specified B group vitamins: Secondary | ICD-10-CM | POA: Diagnosis not present

## 2023-05-05 DIAGNOSIS — M545 Low back pain, unspecified: Secondary | ICD-10-CM | POA: Diagnosis not present

## 2023-05-05 DIAGNOSIS — E119 Type 2 diabetes mellitus without complications: Secondary | ICD-10-CM | POA: Diagnosis not present

## 2023-05-05 NOTE — Patient Instructions (Addendum)
 Please call Dr. Migdalia Dk clinic and see if they have anything to offer.   [address]  [place], [place] [address]  [phone]   Plan on a yearly visit in the fall with fasting labs ahead of time.  Please schedule the B12 shot on the day of the labs, but get the labs done prior to the B12 shot.    Update me as needed.

## 2023-05-05 NOTE — Progress Notes (Unsigned)
 Diabetes:  No meds.  Hypoglycemic episodes: no sx  Hyperglycemic episodes: no sx Feet problems: no Blood Sugars averaging: not checked.  eye exam within last year: yes- Dr. Dione Booze.   Prev MALB d/w pt.  No change in plan.    Back pain.  With standing.  No pain sitting.  Still taking oxycodone at baseline w/o ADE on med.  Less effect compared to a few years ago but some relief with med.  He couldn't get other interventions improved by insurance.    Nosebleeds resolved after using a humidifier.  He has gas logs.  He thought the dry air was the issue.  D/w pt.  No other bleeding, no blood seen in urine or stools.    B12 def.  B12 level now elevated but that was a peak level.  D/w pt about continuing as is.    Meds, vitals, and allergies reviewed.  ROS: Per HPI unless specifically indicated in ROS section   GEN: nad, alert and oriented HEENT: mucous membranes moist, nasal exam normal without bleeding or scabbing. NECK: supple w/o LA CV: rrr. PULM: ctab, no inc wob ABD: soft, +bs EXT: no edema SKIN: well perfused.  No back pain at rest but it starts upon standing.

## 2023-05-06 NOTE — Assessment & Plan Note (Signed)
 B12 level now elevated but that was a peak level.  D/w pt about continuing as is.

## 2023-05-06 NOTE — Assessment & Plan Note (Signed)
 Prev MALB d/w pt.  No change in plan.   Requesting eye exam.  Continue as is, no change in medication.

## 2023-05-06 NOTE — Assessment & Plan Note (Signed)
 Pain with standing standing.  No pain sitting.  Still taking oxycodone at baseline w/o ADE on med.  Less effect compared to a few years ago but some relief with med.  He couldn't get other interventions improved by insurance.   I asked him to check back on that, to see if he can get coverage this calendar year.  Continue oxycodone as needed in the meantime.

## 2023-05-06 NOTE — Assessment & Plan Note (Signed)
 Attributed to dry air, resolved with using a humidifier.  Normal exam.  He can update me as needed.

## 2023-05-28 ENCOUNTER — Other Ambulatory Visit: Payer: Self-pay

## 2023-05-28 ENCOUNTER — Ambulatory Visit

## 2023-05-28 DIAGNOSIS — E538 Deficiency of other specified B group vitamins: Secondary | ICD-10-CM | POA: Diagnosis not present

## 2023-05-28 MED ORDER — CYANOCOBALAMIN 1000 MCG/ML IJ SOLN
1000.0000 ug | Freq: Once | INTRAMUSCULAR | Status: AC
  Administered 2023-05-28: 1000 ug via INTRAMUSCULAR

## 2023-05-28 NOTE — Telephone Encounter (Signed)
 Prescription Request  05/28/2023  LOV: Visit date 05/05/2023  What is the name of the medication or equipment? Oxycodone  apap 10-235 mg  Have you contacted your pharmacy to request a refill? No   Which pharmacy would you like this sent to?  [id number], [place] - [address] [place] [address] Phone: [phone] Fax: [phone]    Patient notified that their request is being sent to the clinical staff for review and that they should receive a response within 2 business days.   Please advise at Mobile [phone] (mobile)   Pt last got refill oxycodone  apap 10-325 mg # 60 on 04/29/23 taking one tab po bid prn.  Pt has appt with Dr Georgianne Kirsten at [facility] on 07/14/23 to see if there is anything can be done. Pt appreciates Dr Harrel Lim suggestion to see Dr Georgianne Kirsten.

## 2023-05-28 NOTE — Progress Notes (Signed)
 Per orders of Dr. Crawford Givens, injection of vitamin b 12 given by Lewanda Rife in left deltoid. Patient tolerated injection well. Patient will make appointment for 1 month.

## 2023-05-29 MED ORDER — OXYCODONE-ACETAMINOPHEN 10-325 MG PO TABS: 1.0000 | ORAL_TABLET | Freq: Two times a day (BID) | ORAL | 0 refills | Status: DC | PRN

## 2023-05-29 NOTE — Telephone Encounter (Signed)
 Sent. Thanks.

## 2023-06-30 ENCOUNTER — Ambulatory Visit

## 2023-06-30 ENCOUNTER — Other Ambulatory Visit: Payer: Self-pay

## 2023-06-30 DIAGNOSIS — E538 Deficiency of other specified B group vitamins: Secondary | ICD-10-CM

## 2023-06-30 MED ORDER — CYANOCOBALAMIN 1000 MCG/ML IJ SOLN
1000.0000 ug | Freq: Once | INTRAMUSCULAR | Status: AC
  Administered 2023-06-30: 1000 ug via INTRAMUSCULAR

## 2023-06-30 NOTE — Progress Notes (Signed)
 After obtaining consent, and per orders of Dr. Vallarie Gauze, injection of B12 given IM in left deltoid by Lockie Rima. Patient tolerated injection well.

## 2023-06-30 NOTE — Telephone Encounter (Signed)
 06/30/2023   LOV: Visit date 05/05/2023   What is the name of the medication or equipment? Oxycodone  apap 10-235 mg   Have you contacted your pharmacy to request a refill? No    Which pharmacy would you like this sent to?  [id number],  - [address] [place] [address] Phone: [phone] Fax: [phone]     Patient notified that their request is being sent to the clinical staff for review and that they should receive a response within 2 business days.    Please advise at Mobile [phone] (mobile)    Pt last got refill oxycodone  apap 10-325 mg # 60 on 05/29/23 taking one tab po bid prn.   Pt has appt with Dr Georgianne Kirsten at [facility] on 07/14/23 to see if there is anything can be done. Pt appreciates Dr Harrel Lim suggestion to see Dr Georgianne Kirsten.

## 2023-07-01 MED ORDER — OXYCODONE-ACETAMINOPHEN 10-325 MG PO TABS: 1.0000 | ORAL_TABLET | Freq: Two times a day (BID) | ORAL | 0 refills | Status: DC | PRN

## 2023-07-01 NOTE — Telephone Encounter (Signed)
 Sent. Thanks.

## 2023-07-14 DIAGNOSIS — Z1331 Encounter for screening for depression: Secondary | ICD-10-CM | POA: Diagnosis not present

## 2023-07-14 DIAGNOSIS — G8929 Other chronic pain: Secondary | ICD-10-CM | POA: Diagnosis not present

## 2023-07-14 DIAGNOSIS — M545 Low back pain, unspecified: Secondary | ICD-10-CM | POA: Diagnosis not present

## 2023-07-30 ENCOUNTER — Other Ambulatory Visit: Payer: Self-pay

## 2023-07-30 ENCOUNTER — Ambulatory Visit

## 2023-07-30 DIAGNOSIS — E538 Deficiency of other specified B group vitamins: Secondary | ICD-10-CM

## 2023-07-30 MED ORDER — OXYCODONE-ACETAMINOPHEN 10-325 MG PO TABS: 1.0000 | ORAL_TABLET | Freq: Two times a day (BID) | ORAL | 0 refills | Status: DC | PRN

## 2023-07-30 MED ORDER — CYANOCOBALAMIN 1000 MCG/ML IJ SOLN
1000.0000 ug | Freq: Once | INTRAMUSCULAR | Status: AC
  Administered 2023-07-30: 1000 ug via INTRAMUSCULAR

## 2023-07-30 NOTE — Progress Notes (Signed)
 Per orders of Dr. Crawford Givens, injection of vitamin b 12 given by Lewanda Rife in right deltoid. Patient tolerated injection well. Patient will make appointment for 1 month.

## 2023-07-30 NOTE — Telephone Encounter (Signed)
 Prescription Request  07/30/2023  LOV: 05/05/2023  What is the name of the medication or equipment? Oxycodone  10-325 mg  Have you contacted your pharmacy to request a refill? No   Which pharmacy would you like this sent to?  [id number], [place] - [address] [place] [address] [place] [address] Phone: [phone] Fax: [phone]    Last refilled on 07/01/23  #60;  pt has enough med to last until next weerk.  Patient notified that their request is being sent to the clinical staff for review and that they should receive a response within 2 business days.   Please advise at [facility] [phone]

## 2023-07-30 NOTE — Telephone Encounter (Signed)
 Sent. Thanks.

## 2023-09-02 ENCOUNTER — Ambulatory Visit (INDEPENDENT_AMBULATORY_CARE_PROVIDER_SITE_OTHER)

## 2023-09-02 ENCOUNTER — Other Ambulatory Visit: Payer: Self-pay

## 2023-09-02 DIAGNOSIS — E538 Deficiency of other specified B group vitamins: Secondary | ICD-10-CM

## 2023-09-02 MED ORDER — CYANOCOBALAMIN 1000 MCG/ML IJ SOLN
1000.0000 ug | Freq: Once | INTRAMUSCULAR | Status: AC
  Administered 2023-09-02: 1000 ug via INTRAMUSCULAR

## 2023-09-02 MED ORDER — OXYCODONE-ACETAMINOPHEN 10-325 MG PO TABS
1.0000 | ORAL_TABLET | Freq: Two times a day (BID) | ORAL | 0 refills | Status: DC | PRN
Start: 2023-09-02 — End: 2023-10-06

## 2023-09-02 NOTE — Telephone Encounter (Signed)
 Prescription Request  09/02/2023  LOV: 05/05/2023  What is the name of the medication or equipment? Oxycodone  apap 10-325 mg                                                                                  Pt last got refilled 07/30/23 # 60                                                                                  Pt has enough med to last until next wk. Have you contacted your pharmacy to request a refill? No   Which pharmacy would you like this sent to?  [id number], [place] - [address] [place] [address] [place] [address] Phone: [phone] Fax: [phone]    Patient notified that their request is being sent to the clinical staff for review and that they should receive a response within 2 business days.   Please advise at [facility] [phone]

## 2023-09-02 NOTE — Progress Notes (Signed)
 Per orders of Dr. Loleta Books is out of office and Dr Eustaquio Boyden who is in office injection of vitamin b 12 given by Lewanda Rife in left deltoid.Patient tolerated injection well. Patient will make appointment for 1 month.

## 2023-09-02 NOTE — Telephone Encounter (Signed)
Sending note to Dr Para March.

## 2023-10-06 ENCOUNTER — Other Ambulatory Visit: Payer: Self-pay

## 2023-10-06 ENCOUNTER — Ambulatory Visit (INDEPENDENT_AMBULATORY_CARE_PROVIDER_SITE_OTHER)

## 2023-10-06 DIAGNOSIS — E538 Deficiency of other specified B group vitamins: Secondary | ICD-10-CM

## 2023-10-06 MED ORDER — CYANOCOBALAMIN 1000 MCG/ML IJ SOLN
1000.0000 ug | Freq: Once | INTRAMUSCULAR | Status: AC
  Administered 2023-10-06: 1000 ug via INTRAMUSCULAR

## 2023-10-06 NOTE — Progress Notes (Signed)
 Per orders of Dr. Crawford Givens, injection of vitamin b 12 given by Lewanda Rife in right deltoid. Patient tolerated injection well. Patient will make appointment for 1 month.

## 2023-10-06 NOTE — Telephone Encounter (Signed)
 Prescription Request  10/06/2023  LOV: 05/05/2023 What is the name of the medication or equipment? Oxycodone  apap 10-325 mg                                                                                   Last refilled # 60 on 09/02/23                                                                                    Pt is not out of med.  Have you contacted your pharmacy to request a refill? No   Which pharmacy would you like this sent to?  [id number] [facility], [place] - [address] [place] [address] [place] [address] Phone: [phone] Fax: [phone]    Patient notified that their request is being sent to the clinical staff for review and that they should receive a response within 2 business days.   Please advise at [facility] [phone]

## 2023-10-07 MED ORDER — OXYCODONE-ACETAMINOPHEN 10-325 MG PO TABS: 1.0000 | ORAL_TABLET | Freq: Two times a day (BID) | ORAL | 0 refills | Status: DC | PRN

## 2023-11-05 ENCOUNTER — Ambulatory Visit (INDEPENDENT_AMBULATORY_CARE_PROVIDER_SITE_OTHER)

## 2023-11-05 ENCOUNTER — Other Ambulatory Visit: Payer: Self-pay

## 2023-11-05 DIAGNOSIS — E538 Deficiency of other specified B group vitamins: Secondary | ICD-10-CM | POA: Diagnosis not present

## 2023-11-05 MED ORDER — CYANOCOBALAMIN 1000 MCG/ML IJ SOLN
1000.0000 ug | Freq: Once | INTRAMUSCULAR | Status: AC
  Administered 2023-11-05: 1000 ug via INTRAMUSCULAR

## 2023-11-05 NOTE — Telephone Encounter (Signed)
 Prescription Request  11/05/2023  LOV: 05/05/2023  What is the name of the medication or equipment? Oxycodone  apap 10-325mg         Last refilled # 60 on 10/07/23.        Pt is not out of med.   Have you contacted your pharmacy to request a refill? No   Which pharmacy would you like this sent to?  [id number], [place] - [address] [place] [address] [place] [address] Phone: [phone] Fax: [phone]    Patient notified that their request is being sent to the clinical staff for review and that they should receive a response within 2 business days.   Please advise at [facility] [phone]

## 2023-11-05 NOTE — Progress Notes (Signed)
 Per orders of Dr. Crawford Givens, injection of vitamin b 12 given by Lewanda Rife in left deltoid. Patient tolerated injection well. Patient will make appointment for 1 month.

## 2023-11-06 MED ORDER — OXYCODONE-ACETAMINOPHEN 10-325 MG PO TABS: 1.0000 | ORAL_TABLET | Freq: Two times a day (BID) | ORAL | 0 refills | Status: DC | PRN

## 2023-11-26 ENCOUNTER — Telehealth: Payer: Self-pay | Admitting: Family Medicine

## 2023-11-26 NOTE — Telephone Encounter (Signed)
 Placed letter in Dr Elfredia box on desk.   Spoke with pt scheduling OV on 12/04/23 at 11:30.

## 2023-11-26 NOTE — Telephone Encounter (Signed)
 Patient states needs a Psychological Evlauation for his fight with Mylene is asking if you are able to do this or if he needs a referral they are asking for this to be complete by 12/04/23. Placed letter he received in providers inbox at front desk

## 2023-11-27 ENCOUNTER — Ambulatory Visit: Payer: Self-pay

## 2023-11-27 NOTE — Telephone Encounter (Signed)
 FYI Only or Action Required?: Pt would like to be worked in today. Pt need psych evaluation completed by next Friday.  Pt states that back pain is the same as it has been . Does not want/need appt for back pain.   Patient was last seen in primary care on 05/05/2023 by Cleatus Arlyss RAMAN, MD.  Called Nurse Triage reporting Advice Only.  Symptoms began ongoing back pain.  Interventions attempted: Other: na.  Symptoms are: unchanged.  Triage Disposition: Call PCP Now  Patient/caregiver understands and will follow disposition?: Yes                       Copied from CRM #[id number]. Topic: Clinical - Red Word Triage >> Nov 27, 2023  8:00 AM Harlene ORN wrote: Red Word that prompted transfer to Nurse Triage: lower back pain has gotten worse in the past two weeks Reason for Disposition  [1] Follow-up call from patient regarding patient's clinical status AND [2] information urgent  Answer Assessment - Initial Assessment Questions Pt would like to be worked in today. Pt need psych evaluation completed by next Friday.   1. REASON FOR CALL or QUESTION: What is your reason for calling today? or How can I best     Pt is requesting a sooner appt,. Pt needs psych evaluation completed by next Friday for  2. CALLER: Document the source of call. (e.g., laboratory staff, caregiver or patient).     Pt  Protocols used: PCP Call - No Triage-A-AH

## 2023-11-27 NOTE — Telephone Encounter (Signed)
 Noted

## 2023-11-27 NOTE — Telephone Encounter (Signed)
 I will work on the form when possible.  This came in yesterday.  I am not in clinic today or Monday.

## 2023-11-27 NOTE — Telephone Encounter (Signed)
 I will work on his form when possible.  This came in yesterday.  I am not in clinic today or Monday.   See if he can get scheduled with me next week if needed.

## 2023-12-03 ENCOUNTER — Ambulatory Visit: Admitting: Orthopaedic Surgery

## 2023-12-03 ENCOUNTER — Other Ambulatory Visit: Payer: Self-pay

## 2023-12-03 ENCOUNTER — Ambulatory Visit

## 2023-12-03 DIAGNOSIS — M25551 Pain in right hip: Secondary | ICD-10-CM | POA: Diagnosis not present

## 2023-12-03 DIAGNOSIS — M7061 Trochanteric bursitis, right hip: Secondary | ICD-10-CM

## 2023-12-03 MED ORDER — LIDOCAINE HCL 1 % IJ SOLN
3.0000 mL | INTRAMUSCULAR | Status: AC | PRN
  Administered 2023-12-03: 3 mL

## 2023-12-03 MED ORDER — METHYLPREDNISOLONE ACETATE 40 MG/ML IJ SUSP
40.0000 mg | INTRAMUSCULAR | Status: AC | PRN
  Administered 2023-12-03: 40 mg via INTRA_ARTICULAR

## 2023-12-03 NOTE — Progress Notes (Signed)
 The patient is a 89 year old gentleman that we are seeing today for his right hip.  He says he was diagnosed sometime ago in [place] with bursitis and had an injection that helped.  He denies any groin pain.  He recently had a spinal cord stimulator about 6 weeks ago done at [facility] because he had a lot of back surgeries on the lumbar spine and has instrumentation in his back.  He says that the spinal cord stimulator has helped with his back pain but he does point the tip of the greater trochanter on the right side as a source of his pain.  He does ambulate using a cane as well.  On exam his right hip moves smoothly and fluidly with no blocks or rotation and no pain in the groin at all.  When we have him lay on his side with the right hip up he does have pain to palpation over the tip of the greater trochanter.  An AP pelvis and lateral of the right hip is unremarkable.  The hip joint space is well-maintained.  There is some narrowing of his left hip joint space.  He is asymptomatic on that side.  There is no cortical irregularities around the trochanteric area.  I did recommend a steroid injection around the point of maximal tenderness of the trochanteric area and he agreed to this and tolerated well.  Hopefully this will calm his symptoms down.  We can always repeat injection in 8 to 12 weeks if needed.  Follow-up is as needed.    Procedure Note  Patient: [name].             Date of Birth: 01/15/1933           MRN: [id number]             Visit Date: 12/03/2023  Procedures: Visit Diagnoses:  1. Pain in right hip     Large Joint Inj: R greater trochanter on 12/03/2023 2:10 PM Indications: pain and diagnostic evaluation Details: 22 G 1.5 in needle, lateral approach  Arthrogram: No  Medications: 3 mL lidocaine  1 %; 40 mg methylPREDNISolone  acetate 40 MG/ML Outcome: tolerated well, no immediate complications Procedure, treatment alternatives, risks and benefits explained, specific  risks discussed. Consent was given by the patient. Immediately prior to procedure a time out was called to verify the correct patient, procedure, equipment, support staff and site/side marked as required. Patient was prepped and draped in the usual sterile fashion.

## 2023-12-04 ENCOUNTER — Ambulatory Visit: Admitting: Family Medicine

## 2023-12-04 ENCOUNTER — Telehealth: Payer: Self-pay

## 2023-12-04 VITALS — BP 142/70 | HR 78 | Temp 98.5°F | Resp 18 | Ht 69.0 in | Wt 205.8 lb

## 2023-12-04 DIAGNOSIS — E538 Deficiency of other specified B group vitamins: Secondary | ICD-10-CM

## 2023-12-04 DIAGNOSIS — M545 Low back pain, unspecified: Secondary | ICD-10-CM | POA: Diagnosis not present

## 2023-12-04 MED ORDER — OXYCODONE-ACETAMINOPHEN 10-325 MG PO TABS: 1.0000 | ORAL_TABLET | Freq: Two times a day (BID) | ORAL | 0 refills | Status: DC | PRN

## 2023-12-04 MED ORDER — CYANOCOBALAMIN 1000 MCG/ML IJ SOLN
1000.0000 ug | Freq: Once | INTRAMUSCULAR | Status: AC
  Administered 2023-12-04: 1000 ug via INTRAMUSCULAR

## 2023-12-04 NOTE — Patient Instructions (Signed)
 Recheck labs prior to next B12 shot- at the same visit for the B12 shot.

## 2023-12-04 NOTE — Assessment & Plan Note (Signed)
 Chronic pain. With Sprint device in place, with some relief.  Continue prn oxycodone  with routine cautions.  He is able to understand the point of his treatment plan and make decisions about that.  He doesn't have an obvious change in thought that would prevent ongoing treatment.   His psychological eval today is without abnormality.

## 2023-12-04 NOTE — Progress Notes (Signed)
 He is in in the midst of trying to get treatment for his back pain through [facility] clinic.  He had relief of upper and middle back pain but still with lower back pain, in spite of lumbar peripheral stimulator usage.  The lower back pain is some better, but not resolved.  No pain sitting but pain standing and walking.  H/o chronic back pain with longstanding opiate use at baseline. Not sedated.  No ADE on med.  Oxycodone  helps pain some but not as much in the past.    He needed psychological eval as part of his treatment.  Discussed.  See below.    Meds, vitals, and allergies reviewed.   ROS: Per HPI unless specifically indicated in ROS section   Nad Normal affect Ncat Neck supple, no LA Rrr Ctab Abd soft not ttp Skin well perfused.  Sprint device in place on the lower back.   Oriented to place and time and self.   Not sedated.  Normal conversation Normal insight.   Fluent language 2025/November/7th. 3/3 attention and recall.   Can do math at visit.   Can read a watch face.   No red flag memory events.    Mood d/w pt.  He is motivated to get his back pain improved so he can be more active.  No SI/HI.  No hallucinations.  He is able to understand the point of his treatment plan and make decisions about that.  He doesn't have an obvious change in thought that would prevent ongoing treatment.

## 2023-12-04 NOTE — Telephone Encounter (Signed)
 Office visit 12/04/23 notes faxed to [facility] for pysch evaluation  with confirmation at   [phone]

## 2023-12-21 DIAGNOSIS — M545 Low back pain, unspecified: Secondary | ICD-10-CM | POA: Diagnosis not present

## 2023-12-21 DIAGNOSIS — G8929 Other chronic pain: Secondary | ICD-10-CM | POA: Diagnosis not present

## 2023-12-31 ENCOUNTER — Ambulatory Visit

## 2023-12-31 ENCOUNTER — Other Ambulatory Visit: Payer: Self-pay

## 2023-12-31 ENCOUNTER — Other Ambulatory Visit

## 2023-12-31 DIAGNOSIS — E538 Deficiency of other specified B group vitamins: Secondary | ICD-10-CM | POA: Diagnosis not present

## 2023-12-31 DIAGNOSIS — E119 Type 2 diabetes mellitus without complications: Secondary | ICD-10-CM

## 2023-12-31 LAB — CBC WITH DIFFERENTIAL/PLATELET
Basophils Absolute: 0 K/uL (ref 0.0–0.1)
Basophils Relative: 0.5 % (ref 0.0–3.0)
Eosinophils Absolute: 0.2 K/uL (ref 0.0–0.7)
Eosinophils Relative: 4.1 % (ref 0.0–5.0)
HCT: 30.5 % — ABNORMAL LOW (ref 39.0–52.0)
Hemoglobin: 10.7 g/dL — ABNORMAL LOW (ref 13.0–17.0)
Lymphocytes Relative: 41.8 % (ref 12.0–46.0)
Lymphs Abs: 1.9 K/uL (ref 0.7–4.0)
MCHC: 35 g/dL (ref 30.0–36.0)
MCV: 92.3 fl (ref 78.0–100.0)
Monocytes Absolute: 0.7 K/uL (ref 0.1–1.0)
Monocytes Relative: 14.7 % — ABNORMAL HIGH (ref 3.0–12.0)
Neutro Abs: 1.7 K/uL (ref 1.4–7.7)
Neutrophils Relative %: 38.9 % — ABNORMAL LOW (ref 43.0–77.0)
Platelets: 147 K/uL — ABNORMAL LOW (ref 150.0–400.0)
RBC: 3.31 Mil/uL — ABNORMAL LOW (ref 4.22–5.81)
RDW: 12.9 % (ref 11.5–15.5)
WBC: 4.5 K/uL (ref 4.0–10.5)

## 2023-12-31 LAB — COMPREHENSIVE METABOLIC PANEL WITH GFR
ALT: 27 U/L (ref 0–53)
AST: 16 U/L (ref 0–37)
Albumin: 4.1 g/dL (ref 3.5–5.2)
Alkaline Phosphatase: 95 U/L (ref 39–117)
BUN: 15 mg/dL (ref 6–23)
CO2: 27 meq/L (ref 19–32)
Calcium: 9.2 mg/dL (ref 8.4–10.5)
Chloride: 104 meq/L (ref 96–112)
Creatinine, Ser: 0.96 mg/dL (ref 0.40–1.50)
GFR: 70.28 mL/min (ref >60.00)
Glucose, Bld: 176 mg/dL — ABNORMAL HIGH (ref 70–99)
Potassium: 4.3 meq/L (ref 3.5–5.1)
Sodium: 138 meq/L (ref 135–145)
Total Bilirubin: 0.9 mg/dL (ref 0.2–1.2)
Total Protein: 6.4 g/dL (ref 6.0–8.3)

## 2023-12-31 LAB — LIPID PANEL
Cholesterol: 114 mg/dL (ref 0–200)
HDL: 32.9 mg/dL — ABNORMAL LOW (ref >39.00)
LDL Cholesterol: 58 mg/dL (ref 0–99)
NonHDL: 81.38
Total CHOL/HDL Ratio: 3
Triglycerides: 119 mg/dL (ref 0.0–149.0)
VLDL: 23.8 mg/dL (ref 0.0–40.0)

## 2023-12-31 LAB — HEMOGLOBIN A1C: Hgb A1c MFr Bld: 7.5 % — ABNORMAL HIGH (ref 4.6–6.5)

## 2023-12-31 LAB — VITAMIN B12: Vitamin B-12: 341 pg/mL (ref 211–911)

## 2023-12-31 LAB — MICROALBUMIN / CREATININE URINE RATIO
Creatinine,U: 27.4 mg/dL
Microalb Creat Ratio: UNDETERMINED mg/g (ref 0.0–30.0)
Microalb, Ur: <0.7 mg/dL

## 2023-12-31 MED ORDER — CYANOCOBALAMIN 1000 MCG/ML IJ SOLN
1000.0000 ug | Freq: Once | INTRAMUSCULAR | Status: AC
  Administered 2023-12-31: 1000 ug via INTRAMUSCULAR

## 2023-12-31 NOTE — Telephone Encounter (Addendum)
 Prescription Request  12/31/2023  LOV: 12/04/23  and last refill # 60 on 12/04/23  What is the name of the medication or equipment? OXYCODONE  APAP 10-325 mg  Have you contacted your pharmacy to request a refill? No   Which pharmacy would you like this sent to?  [id number], [place] - [address] [place] [address] [place] [address] Phone: [phone] Fax: [phone]    Patient notified that their request is being sent to the clinical staff for review and that they should receive a response within 2 business days.   Please advise at [facility] [phone]    Pt saw pain mgt on 12/21/23 .  Pt is not out of medication.

## 2023-12-31 NOTE — Progress Notes (Signed)
 Per orders of Dr. Crawford Givens, injection of vitamin b 12 given by Lewanda Rife in left deltoid. Patient tolerated injection well. Patient will make appointment for 1 month.

## 2024-01-01 MED ORDER — OXYCODONE-ACETAMINOPHEN 10-325 MG PO TABS: 1.0000 | ORAL_TABLET | Freq: Two times a day (BID) | ORAL | 0 refills | Status: DC | PRN

## 2024-01-03 ENCOUNTER — Ambulatory Visit: Payer: Self-pay | Admitting: Family Medicine

## 2024-01-03 ENCOUNTER — Other Ambulatory Visit: Payer: Self-pay | Admitting: Family Medicine

## 2024-01-03 DIAGNOSIS — D649 Anemia, unspecified: Secondary | ICD-10-CM

## 2024-01-05 ENCOUNTER — Ambulatory Visit: Admitting: Family Medicine

## 2024-01-05 DIAGNOSIS — D649 Anemia, unspecified: Secondary | ICD-10-CM

## 2024-01-05 LAB — CBC WITH DIFFERENTIAL/PLATELET
Basophils Absolute: 0 K/uL (ref 0.0–0.1)
Basophils Relative: 0.7 % (ref 0.0–3.0)
Eosinophils Absolute: 0.2 K/uL (ref 0.0–0.7)
Eosinophils Relative: 4.6 % (ref 0.0–5.0)
HCT: 34.1 % — ABNORMAL LOW (ref 39.0–52.0)
Hemoglobin: 11.8 g/dL — ABNORMAL LOW (ref 13.0–17.0)
Lymphocytes Relative: 34.8 % (ref 12.0–46.0)
Lymphs Abs: 1.4 K/uL (ref 0.7–4.0)
MCHC: 34.6 g/dL (ref 30.0–36.0)
MCV: 92.6 fl (ref 78.0–100.0)
Monocytes Absolute: 0.4 K/uL (ref 0.1–1.0)
Monocytes Relative: 10.7 % (ref 3.0–12.0)
Neutro Abs: 2 K/uL (ref 1.4–7.7)
Neutrophils Relative %: 49.2 % (ref 43.0–77.0)
Platelets: 171 K/uL (ref 150.0–400.0)
RBC: 3.68 Mil/uL — ABNORMAL LOW (ref 4.22–5.81)
RDW: 13.1 % (ref 11.5–15.5)
WBC: 4.1 K/uL (ref 4.0–10.5)

## 2024-01-05 LAB — FERRITIN: Ferritin: 189.4 ng/mL (ref 22.0–322.0)

## 2024-01-05 LAB — IRON: Iron: 112 ug/dL (ref 42–165)

## 2024-01-05 NOTE — Patient Instructions (Addendum)
Go to the lab on the way out.   If you have mychart we'll likely use that to update you.    ?Take care.  Glad to see you. ?Don't change your meds for now.  ?

## 2024-01-05 NOTE — Progress Notes (Unsigned)
 Diabetes:  No meds.  Hypoglycemic episodes: no sx  Hyperglycemic episodes: no sx Feet problems: chronic R foot pain since an injury years ago.  No numbness.  No tingling   Blood Sugars averaging:not checked.  eye exam within last year: yes  Dw pt about back pain and prev  Pain with walking.  Clearly less pain sitting.   Sprint device has been removed.   He is going to have f/u to see about options.   Taking oxycodone  w/o sedation.  Used in the AM and prior to activity later in the day.   Anemia d/w pt.  Recheck labs pending.  Still on B12 IM at baseline.  Most recent B12 level wnl and that was a trough level.  No bleeding, no black stools.    Meds, vitals, and allergies reviewed.   ROS: Per HPI unless specifically indicated in ROS section   GEN: nad, alert and oriented HEENT: mucous membranes moist NECK: supple w/o LA CV: rrr. PULM: ctab, no inc wob ABD: soft, +bs EXT: no edema SKIN: no acute rash  Diabetic foot exam: Normal inspection No skin breakdown No calluses  Normal DP pulses Normal sensation to light touch but dec some to  monofilament Nails thickened.

## 2024-01-06 ENCOUNTER — Ambulatory Visit: Payer: Self-pay | Admitting: Family Medicine

## 2024-01-06 DIAGNOSIS — E119 Type 2 diabetes mellitus without complications: Secondary | ICD-10-CM

## 2024-01-06 DIAGNOSIS — D649 Anemia, unspecified: Secondary | ICD-10-CM

## 2024-01-06 NOTE — Assessment & Plan Note (Signed)
 Recheck labs pending.  Still on B12 IM at baseline.  Most recent B12 level wnl and that was a trough level.  No bleeding, no black stools.  See notes on labs. IFOB pending.

## 2024-01-06 NOTE — Assessment & Plan Note (Signed)
 A1c is reassuring but this could be potentially influenced by anemia.  See notes on follow-up labs.  Recheck periodically.

## 2024-01-06 NOTE — Assessment & Plan Note (Signed)
 Sprint device has been removed.   He is going to have f/u to see about options.   Taking oxycodone  w/o sedation.  Used in the AM and prior to activity later in the day.  Okay to continue oxycodone  use as is for now.

## 2024-01-07 ENCOUNTER — Other Ambulatory Visit: Payer: Self-pay | Admitting: Radiology

## 2024-01-07 DIAGNOSIS — D649 Anemia, unspecified: Secondary | ICD-10-CM

## 2024-01-08 LAB — FECAL OCCULT BLOOD, IMMUNOCHEMICAL: Fecal Occult Bld: NEGATIVE

## 2024-02-02 ENCOUNTER — Ambulatory Visit

## 2024-02-02 ENCOUNTER — Other Ambulatory Visit: Payer: Self-pay

## 2024-02-02 DIAGNOSIS — E538 Deficiency of other specified B group vitamins: Secondary | ICD-10-CM

## 2024-02-02 MED ORDER — CYANOCOBALAMIN 1000 MCG/ML IJ SOLN
1000.0000 ug | Freq: Once | INTRAMUSCULAR | Status: AC
  Administered 2024-02-02: 1000 ug via INTRAMUSCULAR

## 2024-02-02 NOTE — Progress Notes (Signed)
 Per orders of Dr. Crawford Givens, injection of vitamin b 12 given by Lewanda Rife in right deltoid. Patient tolerated injection well. Patient will make appointment for 1 month.

## 2024-02-02 NOTE — Telephone Encounter (Addendum)
 Prescription Request  02/02/2024  LOV: 01/05/2024 What is the name of the medication or equipment? Oxycodone  apap 10-325 mg  Last refilled #60 on 01/01/24  Have you contacted your pharmacy to request a refill? No   Which pharmacy would you like this sent to?  [id number], [place] - [address] [place] [address] [place] [address] Phone: [phone] Fax: [phone]    Patient notified that their request is being sent to the clinical staff for review and that they should receive a response within 2 business days.   Please advise at [facility] [phone] .   Pt is not out of medication.

## 2024-02-03 MED ORDER — OXYCODONE-ACETAMINOPHEN 10-325 MG PO TABS: 1.0000 | ORAL_TABLET | Freq: Two times a day (BID) | ORAL | 0 refills | Status: AC | PRN

## 2024-02-03 NOTE — Telephone Encounter (Signed)
Sent x3. Thanks.

## 2024-03-03 ENCOUNTER — Ambulatory Visit

## 2024-03-03 DIAGNOSIS — E538 Deficiency of other specified B group vitamins: Secondary | ICD-10-CM

## 2024-03-03 MED ORDER — CYANOCOBALAMIN 1000 MCG/ML IJ SOLN
1000.0000 ug | Freq: Once | INTRAMUSCULAR | Status: AC
  Administered 2024-03-03: 1000 ug via INTRAMUSCULAR

## 2024-03-03 NOTE — Progress Notes (Signed)
 Per orders of Dr. Crawford Givens, injection of vitamin b 12 given by Lewanda Rife in left deltoid. Patient tolerated injection well. Patient will make appointment for 1 month.

## 2024-03-24 ENCOUNTER — Ambulatory Visit: Admitting: Cardiovascular Disease

## 2024-04-05 ENCOUNTER — Ambulatory Visit

## 2024-04-12 ENCOUNTER — Other Ambulatory Visit

## 2024-04-15 ENCOUNTER — Ambulatory Visit: Admitting: Family Medicine
